# Patient Record
Sex: Male | Born: 1950 | Race: White | Hispanic: No | Marital: Married | Smoking: Current every day smoker
Health system: Southern US, Community
[De-identification: ages and names within clinical notes are randomized; demographics above are authoritative.]

## PROBLEM LIST (undated history)

## (undated) DIAGNOSIS — Z87442 Personal history of urinary calculi: Secondary | ICD-10-CM

## (undated) DIAGNOSIS — Z860100 Personal history of colon polyps, unspecified: Secondary | ICD-10-CM

## (undated) DIAGNOSIS — C801 Malignant (primary) neoplasm, unspecified: Secondary | ICD-10-CM

## (undated) DIAGNOSIS — I10 Essential (primary) hypertension: Secondary | ICD-10-CM

## (undated) DIAGNOSIS — E7800 Pure hypercholesterolemia, unspecified: Secondary | ICD-10-CM

## (undated) DIAGNOSIS — R519 Headache, unspecified: Secondary | ICD-10-CM

## (undated) DIAGNOSIS — T7840XA Allergy, unspecified, initial encounter: Secondary | ICD-10-CM

## (undated) DIAGNOSIS — R51 Headache: Secondary | ICD-10-CM

## (undated) HISTORY — DX: Personal history of colonic polyps: Z86.010

## (undated) HISTORY — DX: Personal history of colon polyps, unspecified: Z86.0100

## (undated) HISTORY — DX: Essential (primary) hypertension: I10

## (undated) HISTORY — DX: Allergy, unspecified, initial encounter: T78.40XA

## (undated) HISTORY — PX: COLONOSCOPY: SHX174

---

## 2000-11-08 ENCOUNTER — Encounter (INDEPENDENT_AMBULATORY_CARE_PROVIDER_SITE_OTHER): Payer: Self-pay | Admitting: Specialist

## 2000-11-08 ENCOUNTER — Ambulatory Visit (HOSPITAL_COMMUNITY): Admission: RE | Admit: 2000-11-08 | Discharge: 2000-11-08 | Payer: Self-pay | Admitting: Gastroenterology

## 2005-03-30 ENCOUNTER — Ambulatory Visit (HOSPITAL_COMMUNITY): Admission: RE | Admit: 2005-03-30 | Discharge: 2005-03-30 | Payer: Self-pay | Admitting: Gastroenterology

## 2005-03-30 ENCOUNTER — Encounter (INDEPENDENT_AMBULATORY_CARE_PROVIDER_SITE_OTHER): Payer: Self-pay | Admitting: Specialist

## 2006-05-02 ENCOUNTER — Ambulatory Visit: Payer: Self-pay | Admitting: Family Medicine

## 2006-05-08 ENCOUNTER — Ambulatory Visit: Payer: Self-pay | Admitting: Family Medicine

## 2006-08-09 ENCOUNTER — Ambulatory Visit: Payer: Self-pay | Admitting: Family Medicine

## 2006-08-15 ENCOUNTER — Ambulatory Visit: Payer: Self-pay | Admitting: Family Medicine

## 2006-11-08 ENCOUNTER — Ambulatory Visit: Payer: Self-pay | Admitting: Family Medicine

## 2006-11-20 ENCOUNTER — Ambulatory Visit: Payer: Self-pay | Admitting: Family Medicine

## 2007-03-29 ENCOUNTER — Ambulatory Visit: Payer: Self-pay | Admitting: Family Medicine

## 2007-07-15 ENCOUNTER — Ambulatory Visit: Payer: Self-pay | Admitting: Family Medicine

## 2007-10-10 ENCOUNTER — Ambulatory Visit: Payer: Self-pay | Admitting: Family Medicine

## 2007-11-22 ENCOUNTER — Ambulatory Visit: Payer: Self-pay | Admitting: Family Medicine

## 2008-03-02 ENCOUNTER — Ambulatory Visit: Payer: Self-pay | Admitting: Family Medicine

## 2008-11-05 ENCOUNTER — Ambulatory Visit: Payer: Self-pay | Admitting: Family Medicine

## 2009-01-08 ENCOUNTER — Ambulatory Visit: Payer: Self-pay | Admitting: Family Medicine

## 2009-08-06 ENCOUNTER — Ambulatory Visit: Payer: Self-pay | Admitting: Family Medicine

## 2009-12-10 ENCOUNTER — Ambulatory Visit: Payer: Self-pay | Admitting: Family Medicine

## 2010-04-12 ENCOUNTER — Ambulatory Visit: Payer: Self-pay | Admitting: Family Medicine

## 2010-08-11 ENCOUNTER — Ambulatory Visit
Admission: RE | Admit: 2010-08-11 | Discharge: 2010-08-11 | Payer: Self-pay | Source: Home / Self Care | Attending: Family Medicine | Admitting: Family Medicine

## 2010-08-19 ENCOUNTER — Ambulatory Visit
Admission: RE | Admit: 2010-08-19 | Discharge: 2010-08-19 | Payer: Self-pay | Source: Home / Self Care | Attending: Family Medicine | Admitting: Family Medicine

## 2010-09-27 ENCOUNTER — Ambulatory Visit (INDEPENDENT_AMBULATORY_CARE_PROVIDER_SITE_OTHER): Payer: BC Managed Care – PPO | Admitting: Family Medicine

## 2010-09-27 ENCOUNTER — Other Ambulatory Visit: Payer: Self-pay | Admitting: Family Medicine

## 2010-09-27 DIAGNOSIS — R319 Hematuria, unspecified: Secondary | ICD-10-CM

## 2010-09-28 ENCOUNTER — Other Ambulatory Visit: Payer: BC Managed Care – PPO

## 2010-10-26 ENCOUNTER — Encounter: Payer: Self-pay | Admitting: Family Medicine

## 2010-11-04 LAB — HM COLONOSCOPY

## 2010-11-28 ENCOUNTER — Ambulatory Visit: Payer: BC Managed Care – PPO | Admitting: Family Medicine

## 2010-12-02 ENCOUNTER — Ambulatory Visit (INDEPENDENT_AMBULATORY_CARE_PROVIDER_SITE_OTHER): Payer: BC Managed Care – PPO | Admitting: Family Medicine

## 2010-12-02 ENCOUNTER — Encounter: Payer: Self-pay | Admitting: Family Medicine

## 2010-12-02 DIAGNOSIS — K5730 Diverticulosis of large intestine without perforation or abscess without bleeding: Secondary | ICD-10-CM

## 2010-12-02 DIAGNOSIS — E1169 Type 2 diabetes mellitus with other specified complication: Secondary | ICD-10-CM

## 2010-12-02 DIAGNOSIS — N400 Enlarged prostate without lower urinary tract symptoms: Secondary | ICD-10-CM

## 2010-12-02 DIAGNOSIS — I10 Essential (primary) hypertension: Secondary | ICD-10-CM

## 2010-12-02 DIAGNOSIS — J309 Allergic rhinitis, unspecified: Secondary | ICD-10-CM

## 2010-12-02 DIAGNOSIS — Z860100 Personal history of colon polyps, unspecified: Secondary | ICD-10-CM

## 2010-12-02 DIAGNOSIS — E119 Type 2 diabetes mellitus without complications: Secondary | ICD-10-CM

## 2010-12-02 DIAGNOSIS — E785 Hyperlipidemia, unspecified: Secondary | ICD-10-CM

## 2010-12-02 DIAGNOSIS — E1159 Type 2 diabetes mellitus with other circulatory complications: Secondary | ICD-10-CM | POA: Insufficient documentation

## 2010-12-02 DIAGNOSIS — E118 Type 2 diabetes mellitus with unspecified complications: Secondary | ICD-10-CM | POA: Insufficient documentation

## 2010-12-02 MED ORDER — VARENICLINE TARTRATE 0.5 MG PO TABS: 0.5000 mg | ORAL_TABLET | Freq: Two times a day (BID) | ORAL | Status: DC

## 2010-12-02 NOTE — Progress Notes (Signed)
  Subjective:    Patient ID: Justin [name], male    DOB: 1950/11/14, 60 y.o.   MRN: [id number]  HPI he is here for a followup visit. He states his blood sugars are running between 90 and 120. He has an eye exam due in the near future. He does check his feet regularly. He is interested in quitting smoking. He was recently evaluated by urology and found to have BPH as well as a renal stone. He is on finasteride. He is also had a recent colonoscopy which did show evidence of diverticulosis as well as colonic polyps. His brother in law recently died.    Review of Systems Negative except as above    Objective:   Physical Exam dirt and in no distress otherwise not examined        Assessment & Plan:  Diabetes. Dyslipidemia. Hypertension. Cigarette abuse. BPH. Colonic polyps. Diverticulosis. Over 45 minutes spent discussing his present situation. He is to continue on his present medication regimen. I discussed smoking cessation with him in regard to Abbot versus addiction. He does have a good handle on this. I will place him on Chantix. Discussed possible side effects of this medication. He will set up an appointment to see me in one month after he starts the Chantix.

## 2010-12-02 NOTE — Patient Instructions (Signed)
Stay on your present medications. Set up an appointment to see me one month after you start the Chantix area and if you have problems in between. Don't hesitate to call.

## 2010-12-09 NOTE — Procedures (Signed)
[place]  Patient:    Justin [name], Justin [name]                     MRN: [id number] Adm. Date:  [id number] Attending:  Charna Elizabeth CC:         Ronnald Nian, M.D.   Procedure Report  DATE OF BIRTH:  19-Jul-1951.  PROCEDURE:  Colonoscopy with snare polypectomy x 4.  ENDOSCOPIST:  Anselmo Rod, M.D.  INSTRUMENT USED:  Olympus video colonoscope.  INDICATION FOR PROCEDURE:  History of rectal bleeding that has been ongoing for two years in a 60 year old white male who had a recent flexible sigmoidoscopy by Dr. Sharlot Gowda that showed a polyp in the left colon. Colonoscopy is being planned to rule out other colonic polyps.  Polypectomy will be done.  PREPROCEDURE PREPARATION:  Informed consent was procured from the patient. The patient was fasted for eight hours prior to the procedure and prepped with a bottle of magnesium citrate and a gallon of NuLytely the night prior to the procedure.  PREPROCEDURE PHYSICAL:  VITAL SIGNS:  The patient had stable vital signs.  NECK:  Supple.  CHEST:  Clear to auscultation.  S1, S2 regular.  ABDOMEN:  Soft with normal abdominal bowel sounds.  DESCRIPTION OF PROCEDURE:  The patient was placed in the left lateral decubitus position and sedated with 50 mg of Demerol and 5 mg of Versed intravenously.  Once the patient was adequately sedate and maintained on low-flow oxygen and continuous cardiac monitoring, the Olympus video colonoscope was advanced from the rectum to the cecum without difficulty.  The patient had a fairly good prep.  There was some residual stool in the colon, especially in the right and transverse colon.  A small polyp was snared at 90 cm.  This was small and sessile in nature.  Another small polyp was snared from 110 cm.  A large pedunculated polyp was snared at 28 cm after the base of the polyp was injected with 5 cc of epinephrine.  There was no bleeding from the polypectomy  site.  Another polyp was snared from about 20 cm.  It was sessile in nature and measured about 5-6 mm in size.  The rest of the colonic mucosa appeared healthy and without lesions.  IMPRESSION:  Multiple colonic polyps.  RECOMMENDATIONS: 1. Await pathology results. 2. Avoid all nonsteroidals, including aspirin, for now. 3. Outpatient follow-up in the next week for further recommendations depending    upon the biopsy results. DD:  11/08/00 TD:  11/10/00 Job: [id number]

## 2011-01-05 ENCOUNTER — Ambulatory Visit: Payer: BC Managed Care – PPO | Admitting: Family Medicine

## 2011-01-06 ENCOUNTER — Ambulatory Visit: Payer: BC Managed Care – PPO | Admitting: Family Medicine

## 2011-04-06 ENCOUNTER — Ambulatory Visit: Payer: BC Managed Care – PPO | Admitting: Family Medicine

## 2011-04-07 ENCOUNTER — Encounter: Payer: Self-pay | Admitting: Family Medicine

## 2011-04-07 ENCOUNTER — Ambulatory Visit (INDEPENDENT_AMBULATORY_CARE_PROVIDER_SITE_OTHER): Payer: BC Managed Care – PPO | Admitting: Family Medicine

## 2011-04-07 DIAGNOSIS — E119 Type 2 diabetes mellitus without complications: Secondary | ICD-10-CM

## 2011-04-07 DIAGNOSIS — E785 Hyperlipidemia, unspecified: Secondary | ICD-10-CM

## 2011-04-07 DIAGNOSIS — Z23 Encounter for immunization: Secondary | ICD-10-CM

## 2011-04-07 DIAGNOSIS — F17200 Nicotine dependence, unspecified, uncomplicated: Secondary | ICD-10-CM

## 2011-04-07 DIAGNOSIS — E1159 Type 2 diabetes mellitus with other circulatory complications: Secondary | ICD-10-CM

## 2011-04-07 DIAGNOSIS — Z2911 Encounter for prophylactic immunotherapy for respiratory syncytial virus (RSV): Secondary | ICD-10-CM

## 2011-04-07 DIAGNOSIS — E1169 Type 2 diabetes mellitus with other specified complication: Secondary | ICD-10-CM

## 2011-04-07 DIAGNOSIS — I10 Essential (primary) hypertension: Secondary | ICD-10-CM

## 2011-04-07 MED ORDER — PIOGLITAZONE HCL 45 MG PO TABS: 45.0000 mg | ORAL_TABLET | Freq: Every day | ORAL | Status: DC

## 2011-04-07 NOTE — Patient Instructions (Signed)
Continue on your present medications and continue to work on quitting smoking.

## 2011-04-07 NOTE — Progress Notes (Signed)
Pt referred to Dr. Dione Booze [phone]  Faxed ov.  Pt aware of appt on 04-20-11 at 3:15

## 2011-04-07 NOTE — Progress Notes (Signed)
  Subjective:    Patient ID: Justin [name], male    DOB: 1951/05/10, 60 y.o.   MRN: [id number]  HPI He is here for a diabetes check. He does smoke. He tried Chantix but just for a few days and stopped. He has cut back to three quarters of a pack per day. Checks his blood sugars periodically and they run in the low 100s. He does need an I. exam. He does check his feet regularly. He continues to work and does plan to retire within the next several years. His marriage is quite well. He continues on other medications listed in the chart.   Review of Systems Negative except as above    Objective:   Physical Exam  Alert and in no distress otherwise not examined      Assessment & Plan:   1. Diabetes mellitus  Ambulatory referral to Ophthalmology, POCT HgB A1C, HgB A1c  2. Current smoker    3. Hypertension associated with diabetes    4. Hyperlipidemia LDL goal < 70     Continue to work on smoking cessation. Continue on current medications. Hemoglobin A1c will be ordered. Flu shot given.

## 2011-04-08 LAB — HEMOGLOBIN A1C: Hgb A1c MFr Bld: 6.3 % — ABNORMAL HIGH (ref <5.7)

## 2011-04-10 ENCOUNTER — Telehealth: Payer: Self-pay

## 2011-04-10 NOTE — Telephone Encounter (Signed)
Talked with Justin [name] told her a1c 6.3

## 2011-08-04 ENCOUNTER — Encounter: Payer: Self-pay | Admitting: Family Medicine

## 2011-08-04 ENCOUNTER — Ambulatory Visit (INDEPENDENT_AMBULATORY_CARE_PROVIDER_SITE_OTHER): Payer: BC Managed Care – PPO | Admitting: Family Medicine

## 2011-08-04 VITALS — BP 130/90 | HR 102 | Ht 70.0 in | Wt 220.0 lb

## 2011-08-04 DIAGNOSIS — I10 Essential (primary) hypertension: Secondary | ICD-10-CM

## 2011-08-04 DIAGNOSIS — E785 Hyperlipidemia, unspecified: Secondary | ICD-10-CM

## 2011-08-04 DIAGNOSIS — E1169 Type 2 diabetes mellitus with other specified complication: Secondary | ICD-10-CM

## 2011-08-04 DIAGNOSIS — E119 Type 2 diabetes mellitus without complications: Secondary | ICD-10-CM

## 2011-08-04 DIAGNOSIS — N400 Enlarged prostate without lower urinary tract symptoms: Secondary | ICD-10-CM

## 2011-08-04 LAB — POCT GLYCOSYLATED HEMOGLOBIN (HGB A1C): Hemoglobin A1C: 6.5

## 2011-08-04 NOTE — Patient Instructions (Signed)
Reporting on the smoking and keep tapering down on that. See you in 4 months

## 2011-08-04 NOTE — Progress Notes (Signed)
  Subjective:    Patient ID: Justin [name], male    DOB: 10/10/1950, 61 y.o.   MRN: [id number]  HPI He is here for a diabetes recheck. He continues on meds listed in the chart. He is having no difficulty with urinary symptoms. He does check his feet periodically and has had a recent eye exam. He is now smoking less than half a pack per day. Exercise is quite minimal since he is laid off from work. He does occasionally check his blood sugar. His marriage is going well.   Review of Systems     Objective:   Physical Exam Alert and in no distress. Hemoglobin A1c is 6.5.       Assessment & Plan:   1. Type II or unspecified type diabetes mellitus without mention of complication, not stated as uncontrolled  POCT HgB A1C  2. BPH (benign prostatic hyperplasia)    3. Diabetes mellitus    4. Hyperlipidemia LDL goal <70    5. Hypertension associated with diabetes     continue on present medications. Continue to work on quitting smoking. Followup here in 4 months

## 2011-12-05 ENCOUNTER — Encounter: Payer: Self-pay | Admitting: Internal Medicine

## 2011-12-08 ENCOUNTER — Encounter: Payer: Self-pay | Admitting: Family Medicine

## 2011-12-08 ENCOUNTER — Ambulatory Visit (INDEPENDENT_AMBULATORY_CARE_PROVIDER_SITE_OTHER): Payer: BC Managed Care – PPO | Admitting: Family Medicine

## 2011-12-08 DIAGNOSIS — Z79899 Other long term (current) drug therapy: Secondary | ICD-10-CM

## 2011-12-08 DIAGNOSIS — E785 Hyperlipidemia, unspecified: Secondary | ICD-10-CM

## 2011-12-08 DIAGNOSIS — J309 Allergic rhinitis, unspecified: Secondary | ICD-10-CM

## 2011-12-08 DIAGNOSIS — I10 Essential (primary) hypertension: Secondary | ICD-10-CM

## 2011-12-08 DIAGNOSIS — F17200 Nicotine dependence, unspecified, uncomplicated: Secondary | ICD-10-CM

## 2011-12-08 DIAGNOSIS — E1169 Type 2 diabetes mellitus with other specified complication: Secondary | ICD-10-CM

## 2011-12-08 DIAGNOSIS — E119 Type 2 diabetes mellitus without complications: Secondary | ICD-10-CM

## 2011-12-08 DIAGNOSIS — N400 Enlarged prostate without lower urinary tract symptoms: Secondary | ICD-10-CM

## 2011-12-08 LAB — COMPREHENSIVE METABOLIC PANEL
ALT: 16 U/L (ref 0–53)
AST: 14 U/L (ref 0–37)
Albumin: 4.5 g/dL (ref 3.5–5.2)
Alkaline Phosphatase: 78 U/L (ref 39–117)
Potassium: 3.6 mEq/L (ref 3.5–5.3)
Sodium: 135 mEq/L (ref 135–145)
Total Protein: 7.3 g/dL (ref 6.0–8.3)

## 2011-12-08 LAB — CBC WITH DIFFERENTIAL/PLATELET
Basophils Absolute: 0.1 10*3/uL (ref 0.0–0.1)
Basophils Relative: 0 % (ref 0–1)
Hemoglobin: 16.3 g/dL (ref 13.0–17.0)
MCHC: 34.4 g/dL (ref 30.0–36.0)
Monocytes Relative: 8 % (ref 3–12)
Neutro Abs: 8.7 10*3/uL — ABNORMAL HIGH (ref 1.7–7.7)
Neutrophils Relative %: 61 % (ref 43–77)
RDW: 13.5 % (ref 11.5–15.5)

## 2011-12-08 LAB — LIPID PANEL: LDL Cholesterol: 77 mg/dL (ref 0–99)

## 2011-12-08 MED ORDER — SIMVASTATIN 10 MG PO TABS: 10.0000 mg | ORAL_TABLET | Freq: Every day | ORAL | Status: DC

## 2011-12-08 MED ORDER — LISINOPRIL-HYDROCHLOROTHIAZIDE 10-12.5 MG PO TABS: 1.0000 | ORAL_TABLET | Freq: Every day | ORAL | Status: DC

## 2011-12-08 MED ORDER — PIOGLITAZONE HCL 45 MG PO TABS
45.0000 mg | ORAL_TABLET | Freq: Every day | ORAL | Status: DC
Start: 2011-12-08 — End: 2012-08-16

## 2011-12-08 MED ORDER — FINASTERIDE 5 MG PO TABS: 5.0000 mg | ORAL_TABLET | Freq: Every day | ORAL | Status: DC

## 2011-12-08 NOTE — Progress Notes (Signed)
  Subjective:    Patient ID: Justin [name], male    DOB: 11-06-1950, 61 y.o.   MRN: [id number]  HPI He is here for a diabetes recheck. He continues on medications listed in the chart. He continues to smoke and again is not interested in quitting. He does intermittently check his blood sugars as well as his feet. He has had an eye exam in the last year. He's has seen an improvement in his urinating since being placed on a finasteride. His allergies are under good control. He view of his record indicates he needs routine blood screening.  Review of Systems     Objective:   Physical Exam Alert and in no distress. Hemoglobin A1c is 6.4.       Assessment & Plan:   1. Diabetes mellitus  POCT HgB A1C, pioglitazone (ACTOS) 45 MG tablet, CBC with Differential, Comprehensive metabolic panel, Lipid panel, POCT UA - Microalbumin  2. Hypertension associated with diabetes  lisinopril-hydrochlorothiazide (PRINZIDE,ZESTORETIC) 10-12.5 MG per tablet, CBC with Differential, Comprehensive metabolic panel  3. Hyperlipidemia LDL goal <70  simvastatin (ZOCOR) 10 MG tablet, Lipid panel  4. Allergic rhinitis    5. Current every day smoker    6. BPH (benign prostatic hyperplasia)  finasteride (PROSCAR) 5 MG tablet  7. Encounter for long-term (current) use of other medications  CBC with Differential, Comprehensive metabolic panel, Lipid panel   he will continue on present medications. Followup pending blood results. His medications were all renewed.

## 2011-12-25 ENCOUNTER — Other Ambulatory Visit: Payer: Self-pay

## 2011-12-25 MED ORDER — FREESTYLE LANCETS MISC: Status: DC

## 2011-12-25 MED ORDER — GLUCOSE BLOOD VI STRP: ORAL_STRIP | Status: DC

## 2012-04-11 ENCOUNTER — Ambulatory Visit (INDEPENDENT_AMBULATORY_CARE_PROVIDER_SITE_OTHER): Payer: BC Managed Care – PPO | Admitting: Family Medicine

## 2012-04-11 ENCOUNTER — Encounter: Payer: Self-pay | Admitting: Family Medicine

## 2012-04-11 VITALS — BP 110/70 | HR 78 | Wt 219.0 lb

## 2012-04-11 DIAGNOSIS — E785 Hyperlipidemia, unspecified: Secondary | ICD-10-CM

## 2012-04-11 DIAGNOSIS — E1169 Type 2 diabetes mellitus with other specified complication: Secondary | ICD-10-CM

## 2012-04-11 DIAGNOSIS — F17200 Nicotine dependence, unspecified, uncomplicated: Secondary | ICD-10-CM

## 2012-04-11 DIAGNOSIS — Z23 Encounter for immunization: Secondary | ICD-10-CM

## 2012-04-11 DIAGNOSIS — I10 Essential (primary) hypertension: Secondary | ICD-10-CM

## 2012-04-11 DIAGNOSIS — E1159 Type 2 diabetes mellitus with other circulatory complications: Secondary | ICD-10-CM

## 2012-04-11 DIAGNOSIS — E119 Type 2 diabetes mellitus without complications: Secondary | ICD-10-CM

## 2012-04-11 LAB — POCT GLYCOSYLATED HEMOGLOBIN (HGB A1C): Hemoglobin A1C: 6.6

## 2012-04-11 NOTE — Progress Notes (Signed)
  Subjective:    Patient ID: Justin [name], male    DOB: [birth year], 61 y.o.   MRN: [id number]  HPI He is here for a followup visit concerning his diabetes. He does check his blood sugars twice per day. He is physically active. He continues to smoke and is not interested in quitting. He continues on medications listed in the chart. He checks his feet regularly and has had an eye exam. His marriage is going well. He is semiretired.  Review of Systems     Objective:   Physical Exam Alert and in no distress. Hemoglobin A1c is 6.6.       Assessment & Plan:   1. Diabetes mellitus  POCT glycosylated hemoglobin (Hb A1C)  2. Need for prophylactic vaccination and inoculation against influenza  Flu vaccine greater than or equal to 3yo preservative free IM  3. Hyperlipidemia LDL goal <70    4. Hypertension associated with diabetes    5. Current every day smoker

## 2012-04-11 NOTE — Patient Instructions (Addendum)
Check your blood sugars either before a meal or 2 hours after a meal 

## 2012-08-16 ENCOUNTER — Other Ambulatory Visit: Payer: Self-pay

## 2012-08-16 ENCOUNTER — Ambulatory Visit (INDEPENDENT_AMBULATORY_CARE_PROVIDER_SITE_OTHER): Payer: BC Managed Care – PPO | Admitting: Family Medicine

## 2012-08-16 ENCOUNTER — Encounter: Payer: Self-pay | Admitting: Family Medicine

## 2012-08-16 VITALS — BP 120/70 | HR 88 | Wt 216.0 lb

## 2012-08-16 DIAGNOSIS — E1169 Type 2 diabetes mellitus with other specified complication: Secondary | ICD-10-CM

## 2012-08-16 DIAGNOSIS — I10 Essential (primary) hypertension: Secondary | ICD-10-CM

## 2012-08-16 DIAGNOSIS — E1159 Type 2 diabetes mellitus with other circulatory complications: Secondary | ICD-10-CM

## 2012-08-16 DIAGNOSIS — E785 Hyperlipidemia, unspecified: Secondary | ICD-10-CM

## 2012-08-16 DIAGNOSIS — F17200 Nicotine dependence, unspecified, uncomplicated: Secondary | ICD-10-CM

## 2012-08-16 DIAGNOSIS — E119 Type 2 diabetes mellitus without complications: Secondary | ICD-10-CM

## 2012-08-16 LAB — POCT GLYCOSYLATED HEMOGLOBIN (HGB A1C): Hemoglobin A1C: 6.5

## 2012-08-16 MED ORDER — PIOGLITAZONE HCL 45 MG PO TABS: 45.0000 mg | ORAL_TABLET | Freq: Every day | ORAL | Status: DC

## 2012-08-16 NOTE — Progress Notes (Signed)
  Subjective:    Patient ID: Justin [name], male    DOB: 01/14/1951, 62 y.o.   MRN: [id number]  HPI He is here for a diabetes recheck. He continues on medications listed in the chart. He is on a limited work schedule. He checks his blood sugars once or twice per week and they are usually in the low 100 range. His exercise is minimal. He continues to smoke and is not interested in quitting. He has not set up for an eye exam yet but does plan to. He does check his feet periodically. He does occasionally have aches and pains in his hips and knees and takes Advil every 2 weeks.   Review of Systems     Objective:   Physical Exam Alert and in no distress. Hemoglobin A1c is 6.5.       Assessment & Plan:   1. Diabetes mellitus  POCT glycosylated hemoglobin (Hb A1C)  2. Hypertension associated with diabetes    3. Hyperlipidemia LDL goal <70    4. Current every day smoker     continue present medication regimen. Did discuss increasing his physical activity. Discussed the possibility of him retiring however he is definitely not interested in quitting work. He enjoys his work. He will set up an appointment to see his eye doctor.

## 2012-12-20 ENCOUNTER — Ambulatory Visit (INDEPENDENT_AMBULATORY_CARE_PROVIDER_SITE_OTHER): Payer: BC Managed Care – PPO | Admitting: Family Medicine

## 2012-12-20 ENCOUNTER — Encounter: Payer: Self-pay | Admitting: Family Medicine

## 2012-12-20 VITALS — BP 120/80 | HR 82 | Wt 214.0 lb

## 2012-12-20 DIAGNOSIS — E785 Hyperlipidemia, unspecified: Secondary | ICD-10-CM

## 2012-12-20 DIAGNOSIS — M1990 Unspecified osteoarthritis, unspecified site: Secondary | ICD-10-CM | POA: Insufficient documentation

## 2012-12-20 DIAGNOSIS — E119 Type 2 diabetes mellitus without complications: Secondary | ICD-10-CM

## 2012-12-20 DIAGNOSIS — F17200 Nicotine dependence, unspecified, uncomplicated: Secondary | ICD-10-CM

## 2012-12-20 DIAGNOSIS — Z79899 Other long term (current) drug therapy: Secondary | ICD-10-CM

## 2012-12-20 DIAGNOSIS — E1169 Type 2 diabetes mellitus with other specified complication: Secondary | ICD-10-CM

## 2012-12-20 DIAGNOSIS — E1159 Type 2 diabetes mellitus with other circulatory complications: Secondary | ICD-10-CM

## 2012-12-20 DIAGNOSIS — I10 Essential (primary) hypertension: Secondary | ICD-10-CM

## 2012-12-20 DIAGNOSIS — M129 Arthropathy, unspecified: Secondary | ICD-10-CM

## 2012-12-20 LAB — CBC WITH DIFFERENTIAL/PLATELET
Lymphocytes Relative: 20 % (ref 12–46)
Lymphs Abs: 3.7 10*3/uL (ref 0.7–4.0)
MCV: 88.3 fL (ref 78.0–100.0)
Neutrophils Relative %: 71 % (ref 43–77)
Platelets: 304 10*3/uL (ref 150–400)
RBC: 5.29 MIL/uL (ref 4.22–5.81)
WBC: 18.4 10*3/uL — ABNORMAL HIGH (ref 4.0–10.5)

## 2012-12-20 LAB — POCT UA - MICROALBUMIN
Albumin/Creatinine Ratio, Urine, POC: 13
Creatinine, POC: 96.7 mg/dL

## 2012-12-20 MED ORDER — PIOGLITAZONE HCL 45 MG PO TABS: 45.0000 mg | ORAL_TABLET | Freq: Every day | ORAL | Status: DC

## 2012-12-20 NOTE — Patient Instructions (Signed)
Try 2 Tylenol 4 times per day and then you can take Advil if you need

## 2012-12-20 NOTE — Progress Notes (Signed)
Subjective:    Justin [name] is a 62 y.o. male who presents for follow-up of Type 2 diabetes mellitus.    Home blood sugar records: 90 TO 120 TEST 1 TIME A DAY   Current symptoms/problems NONE Daily foot checks, foot concerns: YES Last eye exam:  2013   Medication compliance:good Current diet: NONE Current exercise: NONE Known diabetic complications: none Cardiovascular risk factors: advanced age (older than 68 for men, 18 for women), diabetes mellitus, dyslipidemia, hypertension, male gender and smoking/ tobacco exposure He does complain of knee pain especially with physical activity. He notes especially going up and down steps and getting in and out of vehicles cause trouble.  The following portions of the patient's history were reviewed and updated as appropriate: allergies, current medications, past family history, past medical history, past social history, past surgical history and problem list. He is going to set up to get an eye exam. ROS as in subjective above    Objective:    Wt 214 lb (97.07 kg)  BMI 30.71 kg/m2  There were no vitals filed for this visit.  General appearence: alert, no distress, WD/WN Ext: no edema Foot exam: wnl,with a small area of abrasion noted on the right medial foot Neuro: foot monofilament exam normal Exam of his knee shows no deformity or swelling or tenderness.  Lab Review Lab Results  Component Value Date   HGBA1C 6.5 08/16/2012   Lab Results  Component Value Date   CHOL 131 12/08/2011   HDL 27* 12/08/2011   LDLCALC 77 12/08/2011   TRIG 137 12/08/2011   CHOLHDL 4.9 12/08/2011   No results found for this basename: Concepcion Elk     Chemistry      Component Value Date/Time   NA 135 12/08/2011 1538   K 3.6 12/08/2011 1538   CL 102 12/08/2011 1538   CO2 22 12/08/2011 1538   BUN 12 12/08/2011 1538   CREATININE 0.87 12/08/2011 1538      Component Value Date/Time   CALCIUM 9.6 12/08/2011 1538   ALKPHOS 78 12/08/2011 1538   AST  14 12/08/2011 1538   ALT 16 12/08/2011 1538   BILITOT 0.5 12/08/2011 1538        Chemistry      Component Value Date/Time   NA 135 12/08/2011 1538   K 3.6 12/08/2011 1538   CL 102 12/08/2011 1538   CO2 22 12/08/2011 1538   BUN 12 12/08/2011 1538   CREATININE 0.87 12/08/2011 1538      Component Value Date/Time   CALCIUM 9.6 12/08/2011 1538   ALKPHOS 78 12/08/2011 1538   AST 14 12/08/2011 1538   ALT 16 12/08/2011 1538   BILITOT 0.5 12/08/2011 1538       Last optometry/ophthalmology exam reviewed from:    Assessment:  Diabetes mellitus - Plan: POCT glycosylated hemoglobin (Hb A1C), pioglitazone (ACTOS) 45 MG tablet, POCT UA - Microalbumin  Hypertension associated with diabetes  Hyperlipidemia LDL goal <70 - Plan: Lipid panel  Arthritis  Current smoker  Encounter for long-term (current) use of other medications - Plan: CBC with Differential, Comprehensive metabolic panel, Lipid panel        Plan:   recommending conservative care for his arthritis with the use of Tylenol and then inset of choice. 1.  Rx changes: none 2.  Education: Reviewed 'ABCs' of diabetes management (respective goals in parentheses):  A1C (<7), blood pressure (<130/80), and cholesterol (LDL <100). 3.  Compliance at present is estimated to be fair. Efforts  to improve compliance (if necessary) will be directed at none. 4. Follow up: 4 months

## 2012-12-21 LAB — COMPREHENSIVE METABOLIC PANEL
ALT: 14 U/L (ref 0–53)
CO2: 21 mEq/L (ref 19–32)
Calcium: 9.4 mg/dL (ref 8.4–10.5)
Chloride: 106 mEq/L (ref 96–112)
Sodium: 136 mEq/L (ref 135–145)
Total Protein: 7 g/dL (ref 6.0–8.3)

## 2012-12-21 LAB — LIPID PANEL: Cholesterol: 98 mg/dL (ref 0–200)

## 2012-12-23 ENCOUNTER — Other Ambulatory Visit: Payer: Self-pay

## 2012-12-24 ENCOUNTER — Encounter: Payer: Self-pay | Admitting: Family Medicine

## 2012-12-24 ENCOUNTER — Other Ambulatory Visit: Payer: Self-pay | Admitting: Family Medicine

## 2012-12-24 ENCOUNTER — Ambulatory Visit (INDEPENDENT_AMBULATORY_CARE_PROVIDER_SITE_OTHER): Payer: BC Managed Care – PPO | Admitting: Family Medicine

## 2012-12-24 VITALS — BP 160/100 | HR 103

## 2012-12-24 DIAGNOSIS — R310 Gross hematuria: Secondary | ICD-10-CM

## 2012-12-24 DIAGNOSIS — D72829 Elevated white blood cell count, unspecified: Secondary | ICD-10-CM

## 2012-12-24 NOTE — Progress Notes (Signed)
  Subjective:    Patient ID: Justin [name], male    DOB: 1951-01-22, 62 y.o.   MRN: [id number]  HPI He was doing fine until last night when he urinated he noted gross blood. He had no more difficulty until he went to bed that night and developed lower abdominal discomfort as well as continued hematuria,frequency, feeling of incomplete emptying as well as decreased stream. He also saw clots in his urine. He has a previous history of urinary difficulty and has seen Dr. Patsi Sears in the past for this. His medications were reviewed. Also of note is an abnormal white blood count.  Review of Systems     Objective:   Physical Exam Alert and in no distress. Abdominal exam shows tenderness in the suprapubic area but no palpable mass. Urine is grossly bloody.       Assessment & Plan:   Hematuria, gross  Elevated white blood cell count  he will be referred to urology today. I will odor flow cytometry

## 2012-12-25 LAB — REFLEX MYELOID PANEL

## 2012-12-25 LAB — IMMUNOPHENOTYPING BY FLOW CYTOMETRY

## 2013-02-12 NOTE — Progress Notes (Signed)
Quick Note:  Pt wife informed word for word ______

## 2013-04-22 ENCOUNTER — Ambulatory Visit: Payer: BC Managed Care – PPO | Admitting: Family Medicine

## 2013-04-25 ENCOUNTER — Ambulatory Visit (INDEPENDENT_AMBULATORY_CARE_PROVIDER_SITE_OTHER): Payer: BC Managed Care – PPO | Admitting: Family Medicine

## 2013-04-25 ENCOUNTER — Encounter: Payer: Self-pay | Admitting: Family Medicine

## 2013-04-25 VITALS — BP 114/70 | HR 89 | Wt 211.0 lb

## 2013-04-25 DIAGNOSIS — J069 Acute upper respiratory infection, unspecified: Secondary | ICD-10-CM

## 2013-04-25 DIAGNOSIS — Z23 Encounter for immunization: Secondary | ICD-10-CM

## 2013-04-25 DIAGNOSIS — I10 Essential (primary) hypertension: Secondary | ICD-10-CM

## 2013-04-25 DIAGNOSIS — E1169 Type 2 diabetes mellitus with other specified complication: Secondary | ICD-10-CM

## 2013-04-25 DIAGNOSIS — F17200 Nicotine dependence, unspecified, uncomplicated: Secondary | ICD-10-CM

## 2013-04-25 DIAGNOSIS — E785 Hyperlipidemia, unspecified: Secondary | ICD-10-CM

## 2013-04-25 DIAGNOSIS — E119 Type 2 diabetes mellitus without complications: Secondary | ICD-10-CM

## 2013-04-25 DIAGNOSIS — E1159 Type 2 diabetes mellitus with other circulatory complications: Secondary | ICD-10-CM

## 2013-04-25 NOTE — Progress Notes (Signed)
  Subjective:    Justin [name] is a 62 y.o. male who presents for follow-up of Type 2 diabetes mellitus.    Home blood sugar records: 120  Current symptoms/problems include NONE Daily foot checks:    Any foot concerns: NO Last eye exam:  2013   Medication compliance: Current diet: NONE Current exercise: WORKING AND WALKING Known diabetic complications: none Cardiovascular risk factors: advanced age (older than 64 for men, 66 for women), diabetes mellitus, dyslipidemia, hypertension, male gender and sedentary lifestyle He has a ten-day history this started with slight cough and runny nose. No fever, chills, sore throat. He is feeling slightly better.  The following portions of the patient's history were reviewed and updated as appropriate: allergies, current medications, past family history, past medical history, past social history and problem list.  ROS as in subjective above    Objective:   General appearence: alert, no distress, WD/WN Neck: supple, no lymphadenopathy, no thyromegaly, no masses Heart: RRR, normal S1, S2, no murmurs Lungs: CTA bilaterally, no wheezes, rhonchi, or rales  Lab Review Lab Results  Component Value Date   HGBA1C 6.0 12/20/2012   Lab Results  Component Value Date   CHOL 98 12/20/2012   HDL 28* 12/20/2012   LDLCALC 55 12/20/2012   TRIG 77 12/20/2012   CHOLHDL 3.5 12/20/2012   No results found for this basenameConcepcion Elk     Chemistry      Component Value Date/Time   NA 136 12/20/2012 1551   K 3.8 12/20/2012 1551   CL 106 12/20/2012 1551   CO2 21 12/20/2012 1551   BUN 7 12/20/2012 1551   CREATININE 0.80 12/20/2012 1551      Component Value Date/Time   CALCIUM 9.4 12/20/2012 1551   ALKPHOS 94 12/20/2012 1551   AST 15 12/20/2012 1551   ALT 14 12/20/2012 1551   BILITOT 0.5 12/20/2012 1551        Chemistry      Component Value Date/Time   NA 136 12/20/2012 1551   K 3.8 12/20/2012 1551   CL 106 12/20/2012 1551   CO2 21 12/20/2012 1551    BUN 7 12/20/2012 1551   CREATININE 0.80 12/20/2012 1551      Component Value Date/Time   CALCIUM 9.4 12/20/2012 1551   ALKPHOS 94 12/20/2012 1551   AST 15 12/20/2012 1551   ALT 14 12/20/2012 1551   BILITOT 0.5 12/20/2012 1551    Hemoglobin A1c is 6.3       Assessment:  Diabetes mellitus - Plan: POCT glycosylated hemoglobin (Hb A1C)  Hypertension associated with diabetes  Hyperlipidemia LDL goal <70  Current smoker  URI, acute  Need for prophylactic vaccination and inoculation against influenza - Plan: Flu Vaccine QUAD 36+ mos IM        Plan:    1.  Rx changes: none 2.  Education: Reviewed 'ABCs' of diabetes management (respective goals in parentheses):  A1C (<7), blood pressure (<130/80), and cholesterol (LDL <100). 3.  Compliance at present is estimated to be good. Efforts to improve compliance (if necessary) will be directed at Nothing. 4. Follow up: 4 months  Supportive care for the URI. Again encouraged him to quit smoking.

## 2013-05-12 ENCOUNTER — Other Ambulatory Visit: Payer: Self-pay | Admitting: Family Medicine

## 2013-07-23 ENCOUNTER — Other Ambulatory Visit: Payer: Self-pay | Admitting: Family Medicine

## 2013-09-05 ENCOUNTER — Encounter: Payer: Self-pay | Admitting: Family Medicine

## 2013-09-05 ENCOUNTER — Ambulatory Visit (INDEPENDENT_AMBULATORY_CARE_PROVIDER_SITE_OTHER): Payer: BC Managed Care – PPO | Admitting: Family Medicine

## 2013-09-05 VITALS — BP 124/82 | HR 72 | Wt 210.0 lb

## 2013-09-05 DIAGNOSIS — M67919 Unspecified disorder of synovium and tendon, unspecified shoulder: Secondary | ICD-10-CM

## 2013-09-05 DIAGNOSIS — I10 Essential (primary) hypertension: Secondary | ICD-10-CM

## 2013-09-05 DIAGNOSIS — M719 Bursopathy, unspecified: Secondary | ICD-10-CM

## 2013-09-05 DIAGNOSIS — Z6379 Other stressful life events affecting family and household: Secondary | ICD-10-CM

## 2013-09-05 DIAGNOSIS — E1169 Type 2 diabetes mellitus with other specified complication: Secondary | ICD-10-CM

## 2013-09-05 DIAGNOSIS — E1159 Type 2 diabetes mellitus with other circulatory complications: Secondary | ICD-10-CM

## 2013-09-05 DIAGNOSIS — M7582 Other shoulder lesions, left shoulder: Secondary | ICD-10-CM

## 2013-09-05 DIAGNOSIS — F17200 Nicotine dependence, unspecified, uncomplicated: Secondary | ICD-10-CM

## 2013-09-05 DIAGNOSIS — E119 Type 2 diabetes mellitus without complications: Secondary | ICD-10-CM

## 2013-09-05 DIAGNOSIS — E785 Hyperlipidemia, unspecified: Secondary | ICD-10-CM

## 2013-09-05 LAB — POCT GLYCOSYLATED HEMOGLOBIN (HGB A1C): HEMOGLOBIN A1C: 6.6

## 2013-09-05 NOTE — Progress Notes (Signed)
   Subjective:    Patient ID: Justin [name], male    DOB: 10-May-1951, 63 y.o.   MRN: [id number]  HPI He is here for a medication check. He has a 2 month history of left shoulder pain but no history of recent injury or overuse. He has increased pain with abduction and external rotation. He has taken 2 Advil per day. He does hear a popping sensation with certain shoulder movements. He notes that sometimes he wakes up in the middle night with shoulder pain especially when he lays on that. He checks his blood sugar roughly 3 times per week and they run between 100 and 125. He continues on medications listed in the chart. His physical activity is limited. He continues to smoke and is not interested in quitting. He has not had an eye exam a year and promises that he will set up next week. He does check his feet regularly. Also of note is the fact that his wife has had some recent neurologic issues as well as carotid endarterectomy. He has been helping with her care. He does seem to be handling this fairly well.   Review of Systems     Objective:   Physical Exam Alert and in no distress. Full motion of the shoulder with pain on abduction. No palpable tenderness. No laxity noted. Neer's and Hawkins test cause discomfort. Hemoglobin A1c is 6.6       Assessment & Plan:  Type II or unspecified type diabetes mellitus without mention of complication, not stated as uncontrolled - Plan: HgB A1c  Hypertension associated with diabetes  Hyperlipidemia LDL goal <70  Current smoker  Stress due to illness of family member  Tendinitis of left rotator cuff  again discussed the need to quit smoking and he is at least considering this especially in lieu of what happened to his wife. He seems to be handling the situation well with his wife. Discussed treatment of his tendinitis and we'll start initially with conservative care with NSAID and if no improvement he will return for a steroid injection. Over 30  minutes spent discussing all these issues with him.

## 2013-09-05 NOTE — Patient Instructions (Signed)
Rotator Cuff Tendinitis  Rotator cuff tendinitis is inflammation of the tough, cord-like bands that connect muscle to bone (tendons) in your rotator cuff. Your rotator cuff is the collection of all the muscles and tendons that connect your arm to your shoulder. Your rotator cuff holds the head of your upper arm bone (humerus) in the cup (fossa) of your shoulder blade (scapula). CAUSES Rotator cuff tendinitis is usually caused by overusing the joint involved.  SIGNS AND SYMPTOMS  Deep ache in the shoulder also felt on the outside upper arm over the shoulder muscle.  Point tenderness over the area that is injured.  Pain comes on gradually and becomes worse with lifting the arm to the side (abduction) or turning it inward (internal rotation).  May lead to a chronic tear: When a rotator cuff tendon becomes inflamed, it runs the risk of losing its blood supply, causing some tendon fibers to die. This increases the risk that the tendon can fray and partially or completely tear. DIAGNOSIS Rotator cuff tendinitis is diagnosed by taking a medical history, performing a physical exam, and reviewing results of imaging exams. The medical history is useful to help determine the type of rotator cuff injury. The physical exam will include looking at the injured shoulder, feeling the injured area, and watching you do range-of-motion exercises. X-ray exams are typically done to rule out other causes of shoulder pain, such as fractures. MRI is the imaging exam usually used for significant shoulder injuries. Sometimes a dye study called CT arthrogram is done, but it is not as widely used as MRI. In some institutions, special ultrasound tests may also be used to aid in the diagnosis. TREATMENT  Less Severe Cases  Use of a sling to rest the shoulder for a short period of time. Prolonged use of the sling can cause stiffness, weakness, and loss of motion of the shoulder joint.  Anti-inflammatory medicines, such as  ibuprofen or naproxen sodium, may be prescribed. More Severe Cases  Physical therapy.  Use of steroid injections into the shoulder joint.  Surgery. HOME CARE INSTRUCTIONS   Use a sling or splint until the pain decreases. Prolonged use of the sling can cause stiffness, weakness, and loss of motion of the shoulder joint.  Apply ice to the injured area:  Put ice in a plastic bag.  Place a towel between your skin and the bag.  Leave the ice on for 20 minutes, 2 3 times a day.  Try to avoid use other than gentle range of motion while your shoulder is painful. Use the shoulder and exercise only as directed by your health care provider. Stop exercises or range of motion if pain or discomfort increases, unless directed otherwise by your health care provider.  Only take over-the-counter or prescription medicines for pain, discomfort, or fever as directed by your health care provider.  If you were given a shoulder sling and straps (immobilizer), do not remove it except as directed, or until you see a health care provider for a follow-up exam. If you need to remove it, move your arm as little as possible or as directed.  You may want to sleep on several pillows at night to lessen swelling and pain. SEEK IMMEDIATE MEDICAL CARE IF:   Your shoulder pain increases or new pain develops in your arm, hand, or fingers and is not relieved with medicines.  You have new, unexplained symptoms, especially increased numbness in the hands or loss of strength.  You develop any worsening of the   problems that brought you in for care.  Your arm, hand, or fingers are numb or tingling.  Your arm, hand, or fingers are swollen, painful, or turn white or blue. MAKE SURE YOU:  Understand these instructions.  Will watch your condition.  Will get help right away if you are not doing well or get worse. Document Released: 09/30/2003 Document Revised: 04/30/2013 Document Reviewed: 02/19/2013 [facility] Patient  Information 2014 [place], [place]. Take 4 Advil 3 times per day for the next 10-14 days and if no improvement and set up an appointment.

## 2013-10-05 ENCOUNTER — Other Ambulatory Visit: Payer: Self-pay | Admitting: Family Medicine

## 2013-12-15 ENCOUNTER — Other Ambulatory Visit: Payer: Self-pay | Admitting: Family Medicine

## 2014-01-02 ENCOUNTER — Encounter: Payer: Self-pay | Admitting: Family Medicine

## 2014-01-02 ENCOUNTER — Ambulatory Visit (INDEPENDENT_AMBULATORY_CARE_PROVIDER_SITE_OTHER): Payer: BC Managed Care – PPO | Admitting: Family Medicine

## 2014-01-02 VITALS — BP 132/80 | HR 76 | Wt 216.0 lb

## 2014-01-02 DIAGNOSIS — Z860100 Personal history of colon polyps, unspecified: Secondary | ICD-10-CM

## 2014-01-02 DIAGNOSIS — N400 Enlarged prostate without lower urinary tract symptoms: Secondary | ICD-10-CM

## 2014-01-02 DIAGNOSIS — E1159 Type 2 diabetes mellitus with other circulatory complications: Secondary | ICD-10-CM

## 2014-01-02 DIAGNOSIS — F17200 Nicotine dependence, unspecified, uncomplicated: Secondary | ICD-10-CM

## 2014-01-02 DIAGNOSIS — M719 Bursopathy, unspecified: Secondary | ICD-10-CM

## 2014-01-02 DIAGNOSIS — M67919 Unspecified disorder of synovium and tendon, unspecified shoulder: Secondary | ICD-10-CM

## 2014-01-02 DIAGNOSIS — I10 Essential (primary) hypertension: Secondary | ICD-10-CM

## 2014-01-02 DIAGNOSIS — E785 Hyperlipidemia, unspecified: Secondary | ICD-10-CM

## 2014-01-02 DIAGNOSIS — E119 Type 2 diabetes mellitus without complications: Secondary | ICD-10-CM

## 2014-01-02 DIAGNOSIS — M7590 Shoulder lesion, unspecified, unspecified shoulder: Secondary | ICD-10-CM

## 2014-01-02 DIAGNOSIS — E1169 Type 2 diabetes mellitus with other specified complication: Secondary | ICD-10-CM

## 2014-01-02 DIAGNOSIS — Z23 Encounter for immunization: Secondary | ICD-10-CM

## 2014-01-02 LAB — CBC WITH DIFFERENTIAL/PLATELET
BASOS PCT: 0 % (ref 0–1)
Basophils Absolute: 0 10*3/uL (ref 0.0–0.1)
EOS ABS: 0.6 10*3/uL (ref 0.0–0.7)
Eosinophils Relative: 4 % (ref 0–5)
HCT: 46.5 % (ref 39.0–52.0)
Hemoglobin: 16.1 g/dL (ref 13.0–17.0)
Lymphocytes Relative: 24 % (ref 12–46)
Lymphs Abs: 3.4 10*3/uL (ref 0.7–4.0)
MCH: 30.8 pg (ref 26.0–34.0)
MCHC: 34.6 g/dL (ref 30.0–36.0)
MCV: 88.9 fL (ref 78.0–100.0)
Monocytes Absolute: 0.7 10*3/uL (ref 0.1–1.0)
Monocytes Relative: 5 % (ref 3–12)
NEUTROS PCT: 67 % (ref 43–77)
Neutro Abs: 9.6 10*3/uL — ABNORMAL HIGH (ref 1.7–7.7)
PLATELETS: 289 10*3/uL (ref 150–400)
RBC: 5.23 MIL/uL (ref 4.22–5.81)
RDW: 13.3 % (ref 11.5–15.5)
WBC: 14.3 10*3/uL — ABNORMAL HIGH (ref 4.0–10.5)

## 2014-01-02 LAB — POCT GLYCOSYLATED HEMOGLOBIN (HGB A1C): Hemoglobin A1C: 6

## 2014-01-02 MED ORDER — SIMVASTATIN 10 MG PO TABS: 10.0000 mg | ORAL_TABLET | Freq: Every day | ORAL | Status: DC

## 2014-01-02 MED ORDER — GLUCOSE BLOOD VI STRP
ORAL_STRIP | Status: DC
Start: 2014-01-02 — End: 2015-01-29

## 2014-01-02 MED ORDER — FINASTERIDE 5 MG PO TABS: 5.0000 mg | ORAL_TABLET | Freq: Every day | ORAL | Status: DC

## 2014-01-02 MED ORDER — PIOGLITAZONE HCL 45 MG PO TABS: ORAL_TABLET | ORAL | Status: DC

## 2014-01-02 MED ORDER — LISINOPRIL-HYDROCHLOROTHIAZIDE 10-12.5 MG PO TABS: 1.0000 | ORAL_TABLET | Freq: Every day | ORAL | Status: DC

## 2014-01-02 MED ORDER — FREESTYLE LANCETS MISC
Status: DC
Start: 2014-01-02 — End: 2015-01-29

## 2014-01-02 NOTE — Progress Notes (Signed)
   Subjective:    Patient ID: Justin [name], male    DOB: May 09, 1951, 63 y.o.   MRN: [id number]  HPI He is here for a diabetes recheck. He has had difficulty for the last several months with left shoulder pain no history of injury. It bothers him especially when he abducts and externally rotates. No numbness, tingling or weakness. He is also here for a diabetes recheck. His blood sugars usually run in the low 100s. His eating habits are unchanged. He continues on medications listed in the chart. Exercise is quite minimal. He did not get his eye exam He continues to smoke and is not interested in quitting. Drinks occasionally. He is now working 12 hours per day. His marriage is going well. He has no other concerns or complaints. His last colonoscopy was 2012. He continues on Proscar for his BPH. Review of Systems     Objective:   Physical Exam Alert and in no distress. Drop arm test caused pain. No tenderness to palpation over a.c. joint or bicipital groove. Negative sulcus test. Neer's and Hawkins' test was positive. Supraspinatus testing did cause discomfort. Foot exam was negative Hemoglobin A1c is 6.0     Assessment & Plan:  Diabetes mellitus - Plan: HgB A1c, CBC with Differential, Comprehensive metabolic panel, POCT UA - Microalbumin, Lancets (FREESTYLE) lancets, glucose blood test strip, pioglitazone (ACTOS) 45 MG tablet  Hypertension associated with diabetes - Plan: lisinopril-hydrochlorothiazide (PRINZIDE,ZESTORETIC) 10-12.5 MG per tablet  Hyperlipidemia LDL goal <70 - Plan: Lipid panel, simvastatin (ZOCOR) 10 MG tablet  BPH (benign prostatic hyperplasia) - Plan: finasteride (PROSCAR) 5 MG tablet  Current smoker  Hx of colonic polyps  Need for prophylactic vaccination against Streptococcus pneumoniae (pneumococcus) - Plan: CANCELED: Pneumococcal polysaccharide vaccine 23-valent greater than or equal to 2yo subcutaneous/IM  Supraspinatus tendinitis  the left shoulder was  prepped with Betadine. 3 cc of Xylocaine and 40 mg of Kenalog was injected into the subacromial bursa without difficulty. He obtained relief within several minutes. He will continue on his present medications. I again discussed smoking cessation and he is not interested.

## 2014-01-03 LAB — COMPREHENSIVE METABOLIC PANEL
ALT: 15 U/L (ref 0–53)
AST: 15 U/L (ref 0–37)
Albumin: 4.3 g/dL (ref 3.5–5.2)
Alkaline Phosphatase: 82 U/L (ref 39–117)
BILIRUBIN TOTAL: 0.5 mg/dL (ref 0.2–1.2)
BUN: 13 mg/dL (ref 6–23)
CO2: 23 mEq/L (ref 19–32)
CREATININE: 0.78 mg/dL (ref 0.50–1.35)
Calcium: 9.3 mg/dL (ref 8.4–10.5)
Chloride: 105 mEq/L (ref 96–112)
Glucose, Bld: 122 mg/dL — ABNORMAL HIGH (ref 70–99)
Potassium: 3.9 mEq/L (ref 3.5–5.3)
SODIUM: 136 meq/L (ref 135–145)
Total Protein: 6.9 g/dL (ref 6.0–8.3)

## 2014-01-03 LAB — LIPID PANEL
CHOLESTEROL: 121 mg/dL (ref 0–200)
HDL: 25 mg/dL — ABNORMAL LOW (ref >39)
LDL Cholesterol: 73 mg/dL (ref 0–99)
Total CHOL/HDL Ratio: 4.8 Ratio
Triglycerides: 115 mg/dL (ref <150)
VLDL: 23 mg/dL (ref 0–40)

## 2014-05-04 ENCOUNTER — Ambulatory Visit: Payer: BC Managed Care – PPO | Admitting: Family Medicine

## 2014-05-22 ENCOUNTER — Ambulatory Visit (INDEPENDENT_AMBULATORY_CARE_PROVIDER_SITE_OTHER): Payer: BC Managed Care – PPO | Admitting: Family Medicine

## 2014-05-22 ENCOUNTER — Encounter: Payer: Self-pay | Admitting: Family Medicine

## 2014-05-22 VITALS — BP 112/70 | HR 76 | Wt 218.0 lb

## 2014-05-22 DIAGNOSIS — Z23 Encounter for immunization: Secondary | ICD-10-CM

## 2014-05-22 DIAGNOSIS — I10 Essential (primary) hypertension: Secondary | ICD-10-CM

## 2014-05-22 DIAGNOSIS — E119 Type 2 diabetes mellitus without complications: Secondary | ICD-10-CM

## 2014-05-22 DIAGNOSIS — E1159 Type 2 diabetes mellitus with other circulatory complications: Secondary | ICD-10-CM

## 2014-05-22 DIAGNOSIS — E1169 Type 2 diabetes mellitus with other specified complication: Secondary | ICD-10-CM

## 2014-05-22 DIAGNOSIS — F17200 Nicotine dependence, unspecified, uncomplicated: Secondary | ICD-10-CM

## 2014-05-22 DIAGNOSIS — E785 Hyperlipidemia, unspecified: Secondary | ICD-10-CM

## 2014-05-22 DIAGNOSIS — Z720 Tobacco use: Secondary | ICD-10-CM

## 2014-05-22 LAB — POCT GLYCOSYLATED HEMOGLOBIN (HGB A1C): Hemoglobin A1C: 6.2

## 2014-05-22 LAB — POCT UA - MICROALBUMIN
ALBUMIN/CREATININE RATIO, URINE, POC: 20.8
Creatinine, POC: 52.3 mg/dL
MICROALBUMIN (UR) POC: 10.9 mg/L

## 2014-05-22 NOTE — Progress Notes (Signed)
  Subjective:    Justin [name] is a 63 y.o. male who presents for follow-up of Type 2 diabetes mellitus.  Still smoking and no interest in quitting  Home blood sugar records: Patient test B/S one time a day 100 to120  Current symptoms/problems none Daily foot checks:   Any foot concerns: none Last eye exam: going to have done soon. He states he will do this as soon as his work schedule changes to allow time off.   Medication compliance:good Current diet:none Current exercise: none Known diabetic complications: none Cardiovascular risk factors: advanced age (older than 88 for men, 62 for women), diabetes mellitus, dyslipidemia, hypertension, male gender, sedentary lifestyle and smoking/ tobacco exposure   The following portions of the patient's history were reviewed and updated as appropriate: allergies, current medications, past medical history, past social history and problem list.  ROS as in subjective above    Objective:    Wt 218 lb (98.884 kg)   General appearence: alert, no distress, WD/WN   Lab Review Lab Results  Component Value Date   HGBA1C 6.0 01/02/2014   Lab Results  Component Value Date   CHOL 121 01/02/2014   HDL 25* 01/02/2014   LDLCALC 73 01/02/2014   TRIG 115 01/02/2014   CHOLHDL 4.8 01/02/2014   No results found for this basenameDerl Barrow     Chemistry      Component Value Date/Time   NA 136 01/02/2014 0946   K 3.9 01/02/2014 0946   CL 105 01/02/2014 0946   CO2 23 01/02/2014 0946   BUN 13 01/02/2014 0946   CREATININE 0.78 01/02/2014 0946      Component Value Date/Time   CALCIUM 9.3 01/02/2014 0946   ALKPHOS 82 01/02/2014 0946   AST 15 01/02/2014 0946   ALT 15 01/02/2014 0946   BILITOT 0.5 01/02/2014 0946        Chemistry      Component Value Date/Time   NA 136 01/02/2014 0946   K 3.9 01/02/2014 0946   CL 105 01/02/2014 0946   CO2 23 01/02/2014 0946   BUN 13 01/02/2014 0946   CREATININE 0.78 01/02/2014 0946      Component Value  Date/Time   CALCIUM 9.3 01/02/2014 0946   ALKPHOS 82 01/02/2014 0946   AST 15 01/02/2014 0946   ALT 15 01/02/2014 0946   BILITOT 0.5 01/02/2014 0946      Hemoglobin A1c 6.2     Assessment:  Need for prophylactic vaccination against Streptococcus pneumoniae (pneumococcus) - Plan: Pneumococcal conjugate vaccine 13-valent  Need for prophylactic vaccination and inoculation against influenza - Plan: Flu Vaccine QUAD 36+ mos PF IM (Fluarix Quad PF), CANCELED: Flu vaccine HIGH DOSE PF (Fluzone Tri High dose)  Current smoker  Type 2 diabetes mellitus without complication - Plan: POCT glycosylated hemoglobin (Hb A1C), POCT UA - Microalbumin  Hyperlipidemia LDL goal <70  Hypertension associated with diabetes        Plan:    1.  Rx changes: none 2.  Education: Reviewed 'ABCs' of diabetes management (respective goals in parentheses):  A1C (<7), blood pressure (<130/80), and cholesterol (LDL <100). 3.  Compliance at present is estimated to be good. Efforts to improve compliance (if necessary) will be directed at increased exercise. 4. Follow up: 4 months

## 2014-07-03 ENCOUNTER — Other Ambulatory Visit: Payer: Self-pay | Admitting: Family Medicine

## 2014-09-25 ENCOUNTER — Ambulatory Visit (INDEPENDENT_AMBULATORY_CARE_PROVIDER_SITE_OTHER): Payer: BLUE CROSS/BLUE SHIELD | Admitting: Family Medicine

## 2014-09-25 ENCOUNTER — Encounter: Payer: Self-pay | Admitting: Family Medicine

## 2014-09-25 VITALS — BP 126/86 | HR 111 | Wt 217.0 lb

## 2014-09-25 DIAGNOSIS — I10 Essential (primary) hypertension: Secondary | ICD-10-CM

## 2014-09-25 DIAGNOSIS — F17200 Nicotine dependence, unspecified, uncomplicated: Secondary | ICD-10-CM

## 2014-09-25 DIAGNOSIS — E1159 Type 2 diabetes mellitus with other circulatory complications: Secondary | ICD-10-CM

## 2014-09-25 DIAGNOSIS — N400 Enlarged prostate without lower urinary tract symptoms: Secondary | ICD-10-CM

## 2014-09-25 DIAGNOSIS — I152 Hypertension secondary to endocrine disorders: Secondary | ICD-10-CM

## 2014-09-25 DIAGNOSIS — E1169 Type 2 diabetes mellitus with other specified complication: Secondary | ICD-10-CM | POA: Diagnosis not present

## 2014-09-25 DIAGNOSIS — E785 Hyperlipidemia, unspecified: Secondary | ICD-10-CM | POA: Diagnosis not present

## 2014-09-25 DIAGNOSIS — E119 Type 2 diabetes mellitus without complications: Secondary | ICD-10-CM | POA: Diagnosis not present

## 2014-09-25 DIAGNOSIS — Z720 Tobacco use: Secondary | ICD-10-CM

## 2014-09-25 LAB — POCT GLYCOSYLATED HEMOGLOBIN (HGB A1C): HEMOGLOBIN A1C: 6.8

## 2014-09-25 NOTE — Progress Notes (Signed)
  Subjective:    Patient ID: Justin [name], male    DOB: [birth year], 64 y.o.   MRN: [id number]  Justin [name] is a 65 y.o. male who presents for follow-up of Type 2 diabetes mellitus.  Home blood sugar records: Patient test one time a day Current symptoms/problems none Daily foot checks:   Any foot concerns: Yes/feet jump at night while he's asleep Exercise: none EYE: no has not done yet but does plan to set up an appointment. The following portions of the patient's history were reviewed and updated as appropriate: allergies, current medications, past medical history, past social history and problem list.  ROS as in subjective above.     Objective:    Physical Exam Alert and in no distress otherwise not examined.  Lab Review Diabetic Labs Latest Ref Rng 05/22/2014 01/02/2014 09/05/2013 04/25/2013 12/20/2012  HbA1c - 6.2 6.0 6.6 6.3 6.0  Chol 0 - 200 mg/dL - 121 - - 98  HDL >39 mg/dL - 25(L) - - 28(L)  Calc LDL 0 - 99 mg/dL - 73 - - 55  Triglycerides <150 mg/dL - 115 - - 77  Creatinine 0.50 - 1.35 mg/dL - 0.78 - - 0.80   BP/Weight 05/22/2014 01/02/2014 09/05/2013 92/07/1939 01/24/813  Systolic BP 481 856 314 970 263  Diastolic BP 70 80 82 70 785  Wt. (Lbs) 218 216 210 211 -  BMI 31.28 30.99 30.13 30.28 -   Foot/eye exam completion dates 01/02/2014 12/20/2012  Foot Form Completion Done Done    Justin [name]  reports that he has been smoking Cigarettes.  He has a 36 pack-year smoking history. He has never used smokeless tobacco. He reports that he drinks alcohol. He reports that he does not use illicit drugs. A1C 6.8     Assessment & Plan:     Encounter Diagnoses  Name Primary?  . Diabetes mellitus without complication Yes  . Hypertension associated with diabetes   . Hyperlipidemia LDL goal <70   . Type 2 diabetes mellitus without complication   . Current smoker   . BPH (benign prostatic hyperplasia)     Diabetes mellitus without complication - Plan: POCT glycosylated hemoglobin (Hb  A1C)   Rx changes: none  Education: Reviewed 'ABCs' of diabetes management (respective goals in parentheses):  A1C (<7), blood pressure (<130/80), and cholesterol (LDL <100).  Compliance at present is estimated to be good. Efforts to improve compliance (if necessary) will be directed at Quitting smoking.  Follow up: 4 months

## 2014-10-06 ENCOUNTER — Other Ambulatory Visit: Payer: Self-pay

## 2014-11-15 ENCOUNTER — Other Ambulatory Visit: Payer: Self-pay | Admitting: Family Medicine

## 2014-12-13 ENCOUNTER — Other Ambulatory Visit: Payer: Self-pay | Admitting: Family Medicine

## 2015-01-29 ENCOUNTER — Ambulatory Visit (INDEPENDENT_AMBULATORY_CARE_PROVIDER_SITE_OTHER): Payer: BLUE CROSS/BLUE SHIELD | Admitting: Family Medicine

## 2015-01-29 ENCOUNTER — Encounter: Payer: Self-pay | Admitting: Family Medicine

## 2015-01-29 VITALS — BP 100/70 | HR 87 | Wt 215.0 lb

## 2015-01-29 DIAGNOSIS — Z720 Tobacco use: Secondary | ICD-10-CM

## 2015-01-29 DIAGNOSIS — E1169 Type 2 diabetes mellitus with other specified complication: Secondary | ICD-10-CM | POA: Diagnosis not present

## 2015-01-29 DIAGNOSIS — E785 Hyperlipidemia, unspecified: Secondary | ICD-10-CM

## 2015-01-29 DIAGNOSIS — N400 Enlarged prostate without lower urinary tract symptoms: Secondary | ICD-10-CM

## 2015-01-29 DIAGNOSIS — Z86010 Personal history of colonic polyps: Secondary | ICD-10-CM | POA: Diagnosis not present

## 2015-01-29 DIAGNOSIS — I10 Essential (primary) hypertension: Secondary | ICD-10-CM

## 2015-01-29 DIAGNOSIS — E119 Type 2 diabetes mellitus without complications: Secondary | ICD-10-CM | POA: Diagnosis not present

## 2015-01-29 DIAGNOSIS — F17200 Nicotine dependence, unspecified, uncomplicated: Secondary | ICD-10-CM

## 2015-01-29 DIAGNOSIS — E1159 Type 2 diabetes mellitus with other circulatory complications: Secondary | ICD-10-CM

## 2015-01-29 LAB — CBC WITH DIFFERENTIAL/PLATELET
Basophils Absolute: 0 10*3/uL (ref 0.0–0.1)
Basophils Relative: 0 % (ref 0–1)
EOS PCT: 2 % (ref 0–5)
Eosinophils Absolute: 0.3 10*3/uL (ref 0.0–0.7)
HCT: 47.8 % (ref 39.0–52.0)
Hemoglobin: 16.1 g/dL (ref 13.0–17.0)
LYMPHS PCT: 23 % (ref 12–46)
Lymphs Abs: 3.3 10*3/uL (ref 0.7–4.0)
MCH: 30.7 pg (ref 26.0–34.0)
MCHC: 33.7 g/dL (ref 30.0–36.0)
MCV: 91 fL (ref 78.0–100.0)
MPV: 10.9 fL (ref 8.6–12.4)
Monocytes Absolute: 1.1 10*3/uL — ABNORMAL HIGH (ref 0.1–1.0)
Monocytes Relative: 8 % (ref 3–12)
NEUTROS PCT: 67 % (ref 43–77)
Neutro Abs: 9.6 10*3/uL — ABNORMAL HIGH (ref 1.7–7.7)
Platelets: 295 10*3/uL (ref 150–400)
RBC: 5.25 MIL/uL (ref 4.22–5.81)
RDW: 13.3 % (ref 11.5–15.5)
WBC: 14.3 10*3/uL — ABNORMAL HIGH (ref 4.0–10.5)

## 2015-01-29 LAB — LIPID PANEL
Cholesterol: 110 mg/dL (ref 0–200)
HDL: 24 mg/dL — ABNORMAL LOW (ref >=40)
LDL Cholesterol: 63 mg/dL (ref 0–99)
TRIGLYCERIDES: 115 mg/dL (ref <150)
Total CHOL/HDL Ratio: 4.6 Ratio
VLDL: 23 mg/dL (ref 0–40)

## 2015-01-29 LAB — COMPREHENSIVE METABOLIC PANEL
ALK PHOS: 99 U/L (ref 39–117)
ALT: 15 U/L (ref 0–53)
AST: 16 U/L (ref 0–37)
Albumin: 4.4 g/dL (ref 3.5–5.2)
BILIRUBIN TOTAL: 0.6 mg/dL (ref 0.2–1.2)
BUN: 19 mg/dL (ref 6–23)
CO2: 22 mEq/L (ref 19–32)
Calcium: 9.6 mg/dL (ref 8.4–10.5)
Chloride: 103 mEq/L (ref 96–112)
Creat: 1.05 mg/dL (ref 0.50–1.35)
Glucose, Bld: 104 mg/dL — ABNORMAL HIGH (ref 70–99)
Potassium: 3.7 mEq/L (ref 3.5–5.3)
Sodium: 138 mEq/L (ref 135–145)
Total Protein: 7.4 g/dL (ref 6.0–8.3)

## 2015-01-29 LAB — POCT GLYCOSYLATED HEMOGLOBIN (HGB A1C): HEMOGLOBIN A1C: 6.4

## 2015-01-29 MED ORDER — SIMVASTATIN 10 MG PO TABS: 10.0000 mg | ORAL_TABLET | Freq: Every day | ORAL | Status: DC

## 2015-01-29 MED ORDER — FREESTYLE LANCETS MISC: Status: DC

## 2015-01-29 MED ORDER — LISINOPRIL-HYDROCHLOROTHIAZIDE 10-12.5 MG PO TABS: 1.0000 | ORAL_TABLET | Freq: Every day | ORAL | Status: DC

## 2015-01-29 MED ORDER — GLUCOSE BLOOD VI STRP: ORAL_STRIP | Status: DC

## 2015-01-29 MED ORDER — FINASTERIDE 5 MG PO TABS: 5.0000 mg | ORAL_TABLET | Freq: Every day | ORAL | Status: DC

## 2015-01-29 MED ORDER — PIOGLITAZONE HCL 45 MG PO TABS: 45.0000 mg | ORAL_TABLET | Freq: Every day | ORAL | Status: DC

## 2015-01-29 NOTE — Progress Notes (Signed)
  Subjective:    Patient ID: Justin [name], male    DOB: 1950-11-10, 64 y.o.   MRN: [id number]  Justin [name] is a 64 y.o. male who presents for follow-up of Type 2 diabetes mellitus.He does have a history of colonic polyps and is scheduled for repeat colonoscopy at the end of the year. He continues on Proscar and is having no difficulty with that. He also continues on his other blood pressure medications.  Home blood sugar records: Patient test one time a day Current symptoms/problems none Daily foot checks:yes   Any foot concerns:  none Exercise: working Eye exam ? Doesn't have time right now  The following portions of the patient's history were reviewed and updated as appropriate: allergies, current medications, past medical history, past social history and problem list.  ROS as in subjective above.     Objective:    Physical Exam Alert and in no distress otherwise not examined.   Lab Review Diabetic Labs Latest Ref Rng 09/25/2014 05/22/2014 01/02/2014 09/05/2013 04/25/2013  HbA1c - 6.8 6.2 6.0 6.6 6.3  Chol 0 - 200 mg/dL - - 121 - -  HDL >39 mg/dL - - 25(L) - -  Calc LDL 0 - 99 mg/dL - - 73 - -  Triglycerides <150 mg/dL - - 115 - -  Creatinine 0.50 - 1.35 mg/dL - - 0.78 - -   BP/Weight 09/25/2014 05/22/2014 01/02/2014 09/05/2013 93/11/7015  Systolic BP 793 903 009 233 007  Diastolic BP 86 70 80 82 70  Wt. (Lbs) 217 218 216 210 211  BMI 31.14 31.28 30.99 30.13 30.28   Foot/eye exam completion dates 01/02/2014 12/20/2012  Foot Form Completion Done Done    [name]  reports that he has been smoking Cigarettes.  He has a 36 pack-year smoking history. He has never used smokeless tobacco. He reports that he drinks alcohol. He reports that he does not use illicit drugs. States that he has cut back to half a pack per day.   HbA1c 6.4 Assessment & Plan:    Type 2 diabetes mellitus without complication - Plan: POCT glycosylated hemoglobin (Hb A1C), CBC with Differential/Platelet,  Comprehensive metabolic panel, Lipid panel, POCT UA - Microalbumin, pioglitazone (ACTOS) 45 MG tablet, Lancets (FREESTYLE) lancets, glucose blood test strip  Hypertension associated with diabetes - Plan: lisinopril-hydrochlorothiazide (PRINZIDE,ZESTORETIC) 10-12.5 MG per tablet  Hyperlipidemia associated with type 2 diabetes mellitus - Plan: Lipid panel, simvastatin (ZOCOR) 10 MG tablet  Current smoker  History of colonic polyps  BPH (benign prostatic hyperplasia) - Plan: finasteride (PROSCAR) 5 MG tablet   1. Rx changes: none 2. Education: Reviewed 'ABCs' of diabetes management (respective goals in parentheses):  A1C (<7), blood pressure (<130/80), and cholesterol (LDL <100). 3. Compliance at present is estimated to be good. Efforts to improve compliance (if necessary) will be directed at increased exercise. 4. Follow up: 4 months I did complemented him on his cutting back on cigarettes. Encouraged him to continue to try to eliminate entirely. Overall he seems to be doing a better job of taking care of himself. I will discuss follow-up on his smoking with CT scan with his next visit.

## 2015-06-01 ENCOUNTER — Ambulatory Visit: Payer: BLUE CROSS/BLUE SHIELD | Admitting: Family Medicine

## 2015-06-01 ENCOUNTER — Ambulatory Visit (INDEPENDENT_AMBULATORY_CARE_PROVIDER_SITE_OTHER): Payer: BLUE CROSS/BLUE SHIELD | Admitting: Family Medicine

## 2015-06-01 ENCOUNTER — Encounter: Payer: Self-pay | Admitting: Family Medicine

## 2015-06-01 VITALS — BP 130/80 | HR 78 | Resp 14 | Wt 218.0 lb

## 2015-06-01 DIAGNOSIS — I10 Essential (primary) hypertension: Secondary | ICD-10-CM | POA: Diagnosis not present

## 2015-06-01 DIAGNOSIS — Z720 Tobacco use: Secondary | ICD-10-CM | POA: Diagnosis not present

## 2015-06-01 DIAGNOSIS — E785 Hyperlipidemia, unspecified: Secondary | ICD-10-CM | POA: Diagnosis not present

## 2015-06-01 DIAGNOSIS — J302 Other seasonal allergic rhinitis: Secondary | ICD-10-CM | POA: Diagnosis not present

## 2015-06-01 DIAGNOSIS — F17200 Nicotine dependence, unspecified, uncomplicated: Secondary | ICD-10-CM

## 2015-06-01 DIAGNOSIS — G2581 Restless legs syndrome: Secondary | ICD-10-CM

## 2015-06-01 DIAGNOSIS — E1169 Type 2 diabetes mellitus with other specified complication: Secondary | ICD-10-CM

## 2015-06-01 DIAGNOSIS — E1159 Type 2 diabetes mellitus with other circulatory complications: Secondary | ICD-10-CM

## 2015-06-01 DIAGNOSIS — E118 Type 2 diabetes mellitus with unspecified complications: Secondary | ICD-10-CM

## 2015-06-01 DIAGNOSIS — Z23 Encounter for immunization: Secondary | ICD-10-CM | POA: Diagnosis not present

## 2015-06-01 LAB — POCT GLYCOSYLATED HEMOGLOBIN (HGB A1C): Hemoglobin A1C: 6.6

## 2015-06-01 MED ORDER — PRAMIPEXOLE DIHYDROCHLORIDE 0.125 MG PO TABS
0.1250 mg | ORAL_TABLET | Freq: Every day | ORAL | Status: DC
Start: 2015-06-01 — End: 2015-09-30

## 2015-06-01 NOTE — Progress Notes (Signed)
   Subjective:    Patient ID: Justin [name], male    DOB: 12-08-1950, 64 y.o.   MRN: [id number]  HPI He is here for recheck on his diabetes. He states his blood sugars run in the 118 or lower range. He does not exercise regularly. His eating habits are unchanged. He plans to have an eye exam done over the Christmas holidays. He does check his feet regularly. He continues on medications listed in the chart. He smokes and is not ready to quit smoking. His allergies are under good control. He continues on medications listed in the chart. Health maintenance, family and social history as well as immunizations were reviewed. He does complain of a several month history of restless legs especially when he lays down at night. It does interfere with his ability to sleep.   Review of Systems     Objective:   Physical Exam Alert and in no distress. Hemoglobin A1c is 6.6.       Assessment & Plan:  Type 2 diabetes mellitus with complication, without long-term current use of insulin ([facility]) - Plan: POCT glycosylated hemoglobin (Hb A1C)  Hyperlipidemia associated with type 2 diabetes mellitus ([place])  Hypertension associated with diabetes ([place])  Current smoker  Other seasonal allergic rhinitis  Need for prophylactic vaccination and inoculation against influenza - Plan: Flu Vaccine QUAD 36+ mos IM  RLS (restless legs syndrome) - Plan: pramipexole (MIRAPEX) 0.125 MG tablet  I will try him on Mirapex. He is to let me know if this works. Briefly discussed smoking and again he is not interested in quitting at this time.

## 2015-06-01 NOTE — Patient Instructions (Signed)
Let me know how the Mirapex is working

## 2015-06-04 ENCOUNTER — Ambulatory Visit: Payer: BLUE CROSS/BLUE SHIELD | Admitting: Family Medicine

## 2015-09-30 ENCOUNTER — Ambulatory Visit (INDEPENDENT_AMBULATORY_CARE_PROVIDER_SITE_OTHER): Payer: BLUE CROSS/BLUE SHIELD | Admitting: Family Medicine

## 2015-09-30 ENCOUNTER — Encounter: Payer: Self-pay | Admitting: Family Medicine

## 2015-09-30 VITALS — BP 132/72 | HR 96 | Ht 69.0 in | Wt 214.4 lb

## 2015-09-30 DIAGNOSIS — E1159 Type 2 diabetes mellitus with other circulatory complications: Secondary | ICD-10-CM | POA: Diagnosis not present

## 2015-09-30 DIAGNOSIS — E1169 Type 2 diabetes mellitus with other specified complication: Secondary | ICD-10-CM | POA: Diagnosis not present

## 2015-09-30 DIAGNOSIS — G2581 Restless legs syndrome: Secondary | ICD-10-CM

## 2015-09-30 DIAGNOSIS — I152 Hypertension secondary to endocrine disorders: Secondary | ICD-10-CM

## 2015-09-30 DIAGNOSIS — I10 Essential (primary) hypertension: Secondary | ICD-10-CM | POA: Diagnosis not present

## 2015-09-30 DIAGNOSIS — F17200 Nicotine dependence, unspecified, uncomplicated: Secondary | ICD-10-CM

## 2015-09-30 DIAGNOSIS — Z720 Tobacco use: Secondary | ICD-10-CM

## 2015-09-30 DIAGNOSIS — E118 Type 2 diabetes mellitus with unspecified complications: Secondary | ICD-10-CM

## 2015-09-30 DIAGNOSIS — E785 Hyperlipidemia, unspecified: Secondary | ICD-10-CM

## 2015-09-30 LAB — POCT GLYCOSYLATED HEMOGLOBIN (HGB A1C): Hemoglobin A1C: 6.5

## 2015-09-30 NOTE — Progress Notes (Signed)
  Subjective:    Patient ID: Justin [name], male    DOB: 1951/03/10, 65 y.o.   MRN: [id number]  Justin [name] is a 65 y.o. male who presents for follow-up of Type 2 diabetes mellitus.  Home blood sugar records: fasting range: low 100s Current symptoms/problems include none and have been resolved. Daily foot checks:   Any foot concerns: He has noted some restless leg symptoms that do improve with physical activity Exercise: The patient does not participate in regular exercise at present. Diet:regular He does have a history of RLS and had been on Mirapex in the past but he found it to not be useful. He has gotten more benefit out of increasing his exercise which she is now doing. 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 He has lost 5 pounds during the same time frame. He smokes and again is not interested in quitting. Continues on his blood pressure medications as well as statin.he will set up for an eye exam in the near future.The following portions of the patient's history were reviewed and updated as appropriate: allergies, current medications, past medical history, past social history and problem list.  ROS as in subjective above.     Objective:    Physical Exam Alert and in no distress otherwise not examined.  Blood pressure 132/72, pulse 96, height 5\' 9"  (1.753 m), weight 214 lb 6.4 oz (97.251 kg).  Lab Review Diabetic Labs Latest Ref Rng 09/30/2015 06/01/2015 01/29/2015 09/25/2014 05/22/2014  HbA1c - 6.5 6.6 6.4 6.8 6.2  Chol 0 - 200 mg/dL - - 110 - -  HDL >=40 mg/dL - - 24(L) - -  Calc LDL 0 - 99 mg/dL - - 63 - -  Triglycerides <150 mg/dL - - 115 - -  Creatinine 0.50 - 1.35 mg/dL - - 1.05 - -   BP/Weight 09/30/2015 06/01/2015 01/29/2015 09/25/2014 [id number]  Systolic BP [id number]  Diastolic BP 72 80 70 86 70  Wt. (Lbs) 214.4 218 215 217 218  BMI 31.65 31.28 30.85 31.14 31.28   Foot/eye exam completion dates 01/29/2015  01/02/2014  Foot Form Completion Done Done  Justin [name] A1c is 6.5  Justin [name]  reports that he has been smoking Cigarettes.  He has a 36 pack-year smoking history. He has never used smokeless tobacco. He reports that he drinks alcohol. He reports that he does not use illicit drugs.     Assessment & Plan:    Diabetic complication ([place]) - Plan: HgB A1c  Hypertension associated with diabetes ([place])  Hyperlipidemia associated with type 2 diabetes mellitus ([place])  Type 2 diabetes mellitus with complication, without long-term current use of insulin ([place])  Current smoker   Rx changes: none  Education: Reviewed 'ABCs' of diabetes management (respective goals in parentheses):  A1C (<7), blood pressure (<130/80), and cholesterol (LDL <100).  Compliance at present is estimated to be good. Efforts to improve compliance (if necessary) will be directed at increased exercise.  Follow up: 4 months He will continue on his present medication regimen. He will not be taking Mirapex as it does not seem to be working.

## 2016-01-28 ENCOUNTER — Ambulatory Visit (INDEPENDENT_AMBULATORY_CARE_PROVIDER_SITE_OTHER): Payer: BLUE CROSS/BLUE SHIELD | Admitting: Family Medicine

## 2016-01-28 ENCOUNTER — Encounter: Payer: Self-pay | Admitting: Family Medicine

## 2016-01-28 VITALS — BP 120/80 | HR 84 | Ht 69.0 in | Wt 213.6 lb

## 2016-01-28 DIAGNOSIS — N400 Enlarged prostate without lower urinary tract symptoms: Secondary | ICD-10-CM | POA: Diagnosis not present

## 2016-01-28 DIAGNOSIS — F17200 Nicotine dependence, unspecified, uncomplicated: Secondary | ICD-10-CM

## 2016-01-28 DIAGNOSIS — K5730 Diverticulosis of large intestine without perforation or abscess without bleeding: Secondary | ICD-10-CM

## 2016-01-28 DIAGNOSIS — I10 Essential (primary) hypertension: Secondary | ICD-10-CM | POA: Diagnosis not present

## 2016-01-28 DIAGNOSIS — E1169 Type 2 diabetes mellitus with other specified complication: Secondary | ICD-10-CM | POA: Diagnosis not present

## 2016-01-28 DIAGNOSIS — J302 Other seasonal allergic rhinitis: Secondary | ICD-10-CM

## 2016-01-28 DIAGNOSIS — E1159 Type 2 diabetes mellitus with other circulatory complications: Secondary | ICD-10-CM

## 2016-01-28 DIAGNOSIS — E119 Type 2 diabetes mellitus without complications: Secondary | ICD-10-CM | POA: Diagnosis not present

## 2016-01-28 DIAGNOSIS — E118 Type 2 diabetes mellitus with unspecified complications: Secondary | ICD-10-CM

## 2016-01-28 DIAGNOSIS — Z87442 Personal history of urinary calculi: Secondary | ICD-10-CM

## 2016-01-28 DIAGNOSIS — Z720 Tobacco use: Secondary | ICD-10-CM | POA: Diagnosis not present

## 2016-01-28 DIAGNOSIS — E1121 Type 2 diabetes mellitus with diabetic nephropathy: Secondary | ICD-10-CM | POA: Diagnosis not present

## 2016-01-28 DIAGNOSIS — E785 Hyperlipidemia, unspecified: Secondary | ICD-10-CM

## 2016-01-28 LAB — POCT UA - MICROALBUMIN
Albumin/Creatinine Ratio, Urine, POC: 83.2
CREATININE, POC: 65.5 mg/dL
MICROALBUMIN (UR) POC: 54.5 mg/L

## 2016-01-28 LAB — CBC WITH DIFFERENTIAL/PLATELET
BASOS ABS: 0 {cells}/uL (ref 0–200)
Basophils Relative: 0 %
EOS PCT: 1 %
Eosinophils Absolute: 155 cells/uL (ref 15–500)
HCT: 47.5 % (ref 38.5–50.0)
HEMOGLOBIN: 15.9 g/dL (ref 13.2–17.1)
Lymphocytes Relative: 21 %
Lymphs Abs: 3255 cells/uL (ref 850–3900)
MCH: 30.3 pg (ref 27.0–33.0)
MCHC: 33.5 g/dL (ref 32.0–36.0)
MCV: 90.6 fL (ref 80.0–100.0)
MONO ABS: 1240 {cells}/uL — AB (ref 200–950)
MPV: 10.8 fL (ref 7.5–12.5)
Monocytes Relative: 8 %
NEUTROS ABS: 10850 {cells}/uL — AB (ref 1500–7800)
Neutrophils Relative %: 70 %
Platelets: 300 10*3/uL (ref 140–400)
RBC: 5.24 MIL/uL (ref 4.20–5.80)
RDW: 13.7 % (ref 11.0–15.0)
WBC: 15.5 10*3/uL — ABNORMAL HIGH (ref 4.0–10.5)

## 2016-01-28 LAB — POCT GLYCOSYLATED HEMOGLOBIN (HGB A1C): Hemoglobin A1C: 6.4

## 2016-01-28 MED ORDER — FREESTYLE LANCETS MISC: Status: AC

## 2016-01-28 MED ORDER — GLUCOSE BLOOD VI STRP: ORAL_STRIP | Status: AC

## 2016-01-28 MED ORDER — FINASTERIDE 5 MG PO TABS: 5.0000 mg | ORAL_TABLET | Freq: Every day | ORAL | Status: DC

## 2016-01-28 MED ORDER — IBUPROFEN 100 MG PO CHEW
100.0000 mg | CHEWABLE_TABLET | Freq: Three times a day (TID) | ORAL | Status: DC | PRN
Start: 2016-01-28 — End: 2017-03-20

## 2016-01-28 MED ORDER — SIMVASTATIN 10 MG PO TABS: 10.0000 mg | ORAL_TABLET | Freq: Every day | ORAL | Status: AC

## 2016-01-28 MED ORDER — LISINOPRIL-HYDROCHLOROTHIAZIDE 10-12.5 MG PO TABS: 1.0000 | ORAL_TABLET | Freq: Every day | ORAL | Status: DC

## 2016-01-28 MED ORDER — PIOGLITAZONE HCL 45 MG PO TABS: 45.0000 mg | ORAL_TABLET | Freq: Every day | ORAL | Status: DC

## 2016-01-28 NOTE — Progress Notes (Signed)
Subjective:    Patient ID: Justin [name], male    DOB: 05/11/1951, 65 y.o.   MRN: [id number]  Justin [name] is a 65 y.o. male who presents for follow-up of Type 2 diabetes mellitus.  Patient is checking home blood sugars.   Home blood sugar records: 80 to 120 How often is blood sugars being checked: once in a while Current symptoms/problems frequent  Urine  Daily foot checks:yes  Any foot concerns: heels hurt Last eye exam: hasnt had it done yet Exercise: working, garden  He continues to smoke and again is not interested in quitting. He continues on his blood pressure medications as well as statin. Not taking OTC medicine for his nocturia and states that this is helping. He continues on finasteride or his BPH.  He's had no abdominal pain, nausea or vomiting. Does complain of a chronic cough that usually mainly in the morning. Also also previous history of kidney stones. He's had no symptoms of kidney stones. Allergies are under good control. The following portions of the patient's history were reviewed and updated as appropriate: allergies, current medications, past medical history, past social history and problem list.  ROS as in subjective above.     Objective:    Physical Exam Alert and in no distress otherwise not examined.  Weight 213 lb 9.6 oz (96.888 kg).  Lab Review Diabetic Labs Latest Ref Rng 09/30/2015 06/01/2015 01/29/2015 09/25/2014 05/22/2014  HbA1c - 6.5 6.6 6.4 6.8 6.2  Chol 0 - 200 mg/dL - - 110 - -  HDL >=40 mg/dL - - 24(L) - -  Calc LDL 0 - 99 mg/dL - - 63 - -  Triglycerides <150 mg/dL - - 115 - -  Creatinine 0.50 - 1.35 mg/dL - - 1.05 - -   BP/Weight 01/28/2016 09/30/2015 06/01/2015 [id number]  Systolic BP - Q000111Q [id number]  Diastolic BP - 72 80 70 86  Wt. (Lbs) 213.6 214.4 218 215 217  BMI 31.53 31.65 31.28 30.85 31.14   Foot/eye exam completion dates 01/29/2015 01/02/2014  Foot Form Completion Done Done  A1C is 6.4 foot exam is normal. Justin [name]  reports that he  has been smoking Cigarettes.  He has a 36 pack-year smoking history. He has never used smokeless tobacco. He reports that he drinks alcohol. He reports that he does not use illicit drugs.     Assessment & Plan:    Hypertension associated with diabetes ([place]) - Plan: lisinopril-hydrochlorothiazide (PRINZIDE,ZESTORETIC) 10-12.5 MG tablet  Other seasonal allergic rhinitis  Diverticulosis of large intestine without hemorrhage  Current smoker - Plan: CANCELED: US Aorta, CANCELED: Ambulatory Referral for Lung Cancer Scre  Hyperlipidemia associated with type 2 diabetes mellitus ([place]) - Plan: simvastatin (ZOCOR) 10 MG tablet, Lipid panel  Type 2 diabetes mellitus with complication, without long-term current use of insulin ([place]) - Plan: POCT glycosylated hemoglobin (Hb A1C), POCT UA - Microalbumin, CBC with Differential/Platelet, Lipid panel, POCT UA - Microalbumin  BPH (benign prostatic hyperplasia) - Plan: finasteride (PROSCAR) 5 MG tablet  History of renal stone  Type 2 diabetes mellitus without complication, without long-term current use of insulin ([place]) - Plan: glucose blood test strip, Lancets (FREESTYLE) lancets, pioglitazone (ACTOS) 45 MG tablet   1. Rx changes: none 2. Education: Reviewed 'ABCs' of diabetes management (respective goals in parentheses):  A1C (<7), blood pressure (<130/80), and cholesterol (LDL <100). 3. Compliance at present is estimated to be good. Efforts to improve compliance (if necessary) will be directed at increased exercise. 4.  Follow up: 4 months  5. Discussed lung cancer screening as well as abdominal aortic aneurysm and he would like to wait until he goes on Medicare to get this done. He is not interested in quitting smoking.

## 2016-01-29 LAB — LIPID PANEL
CHOL/HDL RATIO: 5.7 ratio — AB (ref <=5.0)
CHOLESTEROL: 143 mg/dL (ref 125–200)
HDL: 25 mg/dL — ABNORMAL LOW (ref >=40)
LDL Cholesterol: 91 mg/dL (ref <130)
TRIGLYCERIDES: 134 mg/dL (ref <150)
VLDL: 27 mg/dL (ref <30)

## 2016-01-30 DIAGNOSIS — E1121 Type 2 diabetes mellitus with diabetic nephropathy: Secondary | ICD-10-CM | POA: Insufficient documentation

## 2016-02-02 ENCOUNTER — Telehealth: Payer: Self-pay

## 2016-02-02 NOTE — Telephone Encounter (Signed)
Left message for mr Fusselman with [facility]rs [facility] to please call and make an appointment

## 2016-02-02 NOTE — Telephone Encounter (Signed)
Let him know that his insurance doesn't want that medicine and have him set up an appointment to discuss alternatives

## 2016-02-02 NOTE — Telephone Encounter (Signed)
Fax received from [facility] that The pioglitazone 45mg  tablets are not covered by pt's ins. They would like an alternative to be prescribed. Please advise. Victorino December

## 2016-02-07 ENCOUNTER — Ambulatory Visit (INDEPENDENT_AMBULATORY_CARE_PROVIDER_SITE_OTHER): Payer: BLUE CROSS/BLUE SHIELD | Admitting: Family Medicine

## 2016-02-07 DIAGNOSIS — E1121 Type 2 diabetes mellitus with diabetic nephropathy: Secondary | ICD-10-CM | POA: Diagnosis not present

## 2016-02-07 NOTE — Patient Instructions (Signed)
Use the medicines that you have at home and keep track of your blood sugars less if we can get through to the beginning of the year then we'll readdress.

## 2016-02-07 NOTE — Progress Notes (Signed)
   Subjective:    Patient ID: KARANVIR LEVINS, male    DOB: Jun 26, 1951, 65 y.o.   MRN: [id number]  HPI He is here for consult concerning the use of by mouth glitazone. Apparently his insurance will not cover this any more. Further discussion with him indicates that he has 3-90 day bottle still left but their concerns about the expiration date.   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 Recommended he continue on them and keep track of his blood sugars. We can then readdress the diabetes issue next year when he will be on Medicare and possibly switch him to metformin. He was comfortable with this.

## 2016-02-10 ENCOUNTER — Telehealth: Payer: Self-pay | Admitting: Family Medicine

## 2016-02-19 NOTE — Telephone Encounter (Signed)
P.A. Isabelle Course, pt needs trial of one of the following Bydureon, Byetta, Trulicity, Victoza, Janumet, [place], [place], [place], [place], [employer].  Do you want to switch?

## 2016-02-21 ENCOUNTER — Other Ambulatory Visit: Payer: Self-pay

## 2016-02-21 MED ORDER — SITAGLIPTIN PHOSPHATE 100 MG PO TABS: 100.0000 mg | ORAL_TABLET | Freq: Every day | ORAL | 1 refills | Status: DC

## 2016-02-21 NOTE — Telephone Encounter (Signed)
Let him know the insurance is getting in the way. Place him on Januvia 100 mg a day and have him follow-up with me with his next diabetes appointment

## 2016-02-21 NOTE — Telephone Encounter (Signed)
Let him know the insurance is getting in the way. Place him on Januvia 100 mg a day and have him follow-up with me with his next diabetes appointment  I have called pt left message on machine word for word

## 2016-02-21 NOTE — Telephone Encounter (Signed)
Left word for word message on machine  

## 2016-02-22 ENCOUNTER — Telehealth: Payer: Self-pay

## 2016-02-24 NOTE — Telephone Encounter (Signed)
Januvia requried P.A. Also, this was completed and faxed

## 2016-02-24 NOTE — Telephone Encounter (Signed)
error 

## 2016-03-03 NOTE — Telephone Encounter (Signed)
Januvia approved til 02/23/17, left message with daughter

## 2016-05-26 ENCOUNTER — Encounter: Payer: Self-pay | Admitting: Family Medicine

## 2016-05-26 ENCOUNTER — Ambulatory Visit (INDEPENDENT_AMBULATORY_CARE_PROVIDER_SITE_OTHER): Payer: BLUE CROSS/BLUE SHIELD | Admitting: Family Medicine

## 2016-05-26 VITALS — BP 112/70 | HR 110 | Wt 212.0 lb

## 2016-05-26 DIAGNOSIS — Z23 Encounter for immunization: Secondary | ICD-10-CM | POA: Diagnosis not present

## 2016-05-26 DIAGNOSIS — E1159 Type 2 diabetes mellitus with other circulatory complications: Secondary | ICD-10-CM | POA: Diagnosis not present

## 2016-05-26 DIAGNOSIS — F17200 Nicotine dependence, unspecified, uncomplicated: Secondary | ICD-10-CM

## 2016-05-26 DIAGNOSIS — I10 Essential (primary) hypertension: Secondary | ICD-10-CM | POA: Diagnosis not present

## 2016-05-26 DIAGNOSIS — E1169 Type 2 diabetes mellitus with other specified complication: Secondary | ICD-10-CM | POA: Diagnosis not present

## 2016-05-26 DIAGNOSIS — E1121 Type 2 diabetes mellitus with diabetic nephropathy: Secondary | ICD-10-CM | POA: Diagnosis not present

## 2016-05-26 DIAGNOSIS — E785 Hyperlipidemia, unspecified: Secondary | ICD-10-CM

## 2016-05-26 LAB — POCT GLYCOSYLATED HEMOGLOBIN (HGB A1C): Hemoglobin A1C: 6.8

## 2016-05-26 NOTE — Progress Notes (Signed)
  Subjective:    Patient ID: Justin [name], male    DOB: Jan 24, 1951, 65 y.o.   MRN: [id number]  Justin [name] is a 65 y.o. male who presents for follow-up of Type 2 diabetes mellitus.  Patient is checking home blood sugars.   Home blood sugar records: 95 to 120 How often is blood sugars being checked: every other day Current symptoms/problems none Daily foot checks: yes   Any foot concerns: none Last eye exam: not yetAnd he plans to get an eye exam when he goes on Medicare in about 4 months. Exercise: working He continues to smoke and again has no desire to quit. He continues on lisinopril and having no difficulty with this. He also is taking Januvia. He has a previous history of difficulty with metformin causing diarrhea. He continues on simvastatin without trouble. He is working 3 days per week. The following portions of the patient's history were reviewed and updated as appropriate: allergies, current medications, past medical history, past social history and problem list.  ROS as in subjective above.     Objective:    Physical Exam Alert and in no distress otherwise not examined.   Lab Review Diabetic Labs Latest Ref Rng & Units 01/28/2016 09/30/2015 06/01/2015 01/29/2015 09/25/2014  HbA1c - 6.4 6.5 6.6 6.4 6.8  Microalbumin mg/L 54.5 - - - -  Micro/Creat Ratio - 83.2 - - - -  Chol 125 - 200 mg/dL 143 - - 110 -  HDL >=40 mg/dL 25(L) - - 24(L) -  Calc LDL <130 mg/dL 91 - - 63 -  Triglycerides <150 mg/dL 134 - - 115 -  Creatinine 0.50 - 1.35 mg/dL - - - 1.05 -   BP/Weight 01/28/2016 09/30/2015 06/01/2015 [id number]  Systolic BP 123456 [id number]  Diastolic BP 80 72 80 70 86  Wt. (Lbs) 213.6 214.4 218 215 217  BMI 31.53 31.65 31.28 30.85 31.14   Foot/eye exam completion dates 01/28/2016 01/29/2015  Foot Form Completion Done Done  A1c is 6.8  Justin [name]  reports that he has been smoking Cigarettes.  He has a 36.00 pack-year smoking history. He has never used smokeless tobacco. He  reports that he drinks alcohol. He reports that he does not use drugs.     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 Rx changes: none 2. Education: Reviewed 'ABCs' of diabetes management (respective goals in parentheses):  A1C (<7), blood pressure (<130/80), and cholesterol (LDL <100). 3. Compliance at present is estimated to be good. Efforts to improve compliance (if necessary) will be directed at No change. 4. Follow up: 4 months When he goes on Medicare, he plans to get his eyes checked.

## 2016-07-21 ENCOUNTER — Ambulatory Visit (INDEPENDENT_AMBULATORY_CARE_PROVIDER_SITE_OTHER): Payer: BLUE CROSS/BLUE SHIELD | Admitting: Family Medicine

## 2016-07-21 ENCOUNTER — Ambulatory Visit
Admission: RE | Admit: 2016-07-21 | Discharge: 2016-07-21 | Disposition: A | Discharge status: Home / Self Care | Payer: Self-pay | Source: Ambulatory Visit | Attending: Family Medicine | Admitting: Family Medicine

## 2016-07-21 ENCOUNTER — Encounter: Payer: Self-pay | Admitting: Family Medicine

## 2016-07-21 VITALS — BP 130/80 | HR 90 | Wt 212.6 lb

## 2016-07-21 DIAGNOSIS — R1314 Dysphagia, pharyngoesophageal phase: Secondary | ICD-10-CM

## 2016-07-21 LAB — COMPREHENSIVE METABOLIC PANEL
ALT: 14 U/L (ref 9–46)
AST: 14 U/L (ref 10–35)
Albumin: 4.4 g/dL (ref 3.6–5.1)
Alkaline Phosphatase: 90 U/L (ref 40–115)
BUN: 13 mg/dL (ref 7–25)
CHLORIDE: 105 mmol/L (ref 98–110)
CO2: 19 mmol/L — AB (ref 20–31)
CREATININE: 0.79 mg/dL (ref 0.70–1.25)
Calcium: 9.6 mg/dL (ref 8.6–10.3)
Glucose, Bld: 107 mg/dL — ABNORMAL HIGH (ref 65–99)
Potassium: 3.8 mmol/L (ref 3.5–5.3)
SODIUM: 135 mmol/L (ref 135–146)
Total Bilirubin: 0.5 mg/dL (ref 0.2–1.2)
Total Protein: 7.4 g/dL (ref 6.1–8.1)

## 2016-07-21 LAB — CBC WITH DIFFERENTIAL/PLATELET
BASOS ABS: 0 {cells}/uL (ref 0–200)
Basophils Relative: 0 %
EOS PCT: 1 %
Eosinophils Absolute: 154 cells/uL (ref 15–500)
HCT: 48.9 % (ref 38.5–50.0)
Hemoglobin: 16.6 g/dL (ref 13.2–17.1)
Lymphocytes Relative: 13 %
Lymphs Abs: 2002 cells/uL (ref 850–3900)
MCH: 30.6 pg (ref 27.0–33.0)
MCHC: 33.9 g/dL (ref 32.0–36.0)
MCV: 90.2 fL (ref 80.0–100.0)
MONOS PCT: 9 %
MPV: 10.8 fL (ref 7.5–12.5)
Monocytes Absolute: 1386 cells/uL — ABNORMAL HIGH (ref 200–950)
NEUTROS ABS: 11858 {cells}/uL — AB (ref 1500–7800)
NEUTROS PCT: 77 %
PLATELETS: 264 10*3/uL (ref 140–400)
RBC: 5.42 MIL/uL (ref 4.20–5.80)
RDW: 13.3 % (ref 11.0–15.0)
WBC: 15.4 10*3/uL — ABNORMAL HIGH (ref 4.0–10.5)

## 2016-07-21 NOTE — Patient Instructions (Signed)
Keep yourself hydrated. Make sure you're Primus Bravo chewed up very well or even possibly put in a blender. Rice and possible probably be easier to deal with.

## 2016-07-21 NOTE — Progress Notes (Signed)
   Subjective:    Patient ID: Justin [name], male    DOB: [birth year], 65 y.o.   MRN: [id number]  HPI He complains of a 1 month history of difficulty with swallowing especially solid foods. This is been getting worse within the last several weeks. He feels as if food is getting stuck at the same place. He also feels as if he has a lump in his throat in that area. He has not seen any blood, shortness of breath, fever, weight change. No black stools or change in bowel habits.   Review of Systems     Objective:   Physical Exam Alert and in no distress. Tympanic membranes and canals are normal. Pharyngeal area is normal. Neck is supple without adenopathy or thyromegaly. Cardiac exam shows a regular sinus rhythm without murmurs or gallops. Lungs are clear to auscultation. X-ray does no changes.       Assessment & Plan:  Pharyngoesophageal dysphagia - Plan: CBC with Differential/Platelet, Comprehensive metabolic panel, DG Chest 2 View Is scheduled to be seen by GI in one week. Cautioned to make sure he drinks fluids and eats foods that are easy to swallow specifically rice and soups. If he has difficulty with that, I did recommend that he go to the emergency room for further evaluation. The abrupt onset this is quite concerning.

## 2016-07-26 ENCOUNTER — Telehealth: Payer: Self-pay | Admitting: Family Medicine

## 2016-07-26 NOTE — Telephone Encounter (Signed)
Patient called and said you wanted to know how he was feeling and he said FINE.  Please cancel appt for tomorrow that you were going to make.  Pt ph [phone]

## 2016-07-26 NOTE — Telephone Encounter (Signed)
Called and cancelled appt for pt with Dr. Collene Mares. Justin [name]

## 2016-07-26 NOTE — Telephone Encounter (Signed)
Canceled the appointment with GI. I think it is with Dr Collene Mares

## 2016-08-06 ENCOUNTER — Other Ambulatory Visit: Payer: Self-pay | Admitting: Family Medicine

## 2016-08-08 ENCOUNTER — Telehealth: Payer: Self-pay | Admitting: Family Medicine

## 2016-08-08 NOTE — Telephone Encounter (Signed)
Pt called and states his throat is giving him trouble again and wants to see Dr. Collene Mares now.  He is at home all day on Wednesday.  Please call pt with appt [phone]  He cancelled the first one because he got better.

## 2016-08-11 NOTE — Telephone Encounter (Signed)
Faxed request to [facility]

## 2016-08-15 ENCOUNTER — Other Ambulatory Visit: Payer: Self-pay | Admitting: Gastroenterology

## 2016-08-17 ENCOUNTER — Encounter (HOSPITAL_COMMUNITY): Payer: Self-pay | Admitting: *Deleted

## 2016-08-17 NOTE — Progress Notes (Signed)
Pt denies SOB and chest pain. Pt made aware to not take diabetes medication (pill ) Januvia, the morning of procedure. Pt made aware of diabetes protocol to check blood glucose (BG) every 2 hours prior to arrival to hospital. Pt made aware to drink 4 ounces of Apple juice or Cranberry juice to treat a BG <70 and recheck BG 15 minutes after drinking juice. Pt made aware to phone number to Endo if BG is still < 70 after drinking juice or any other emergency the morning of procedure Pt stated that he does not take NSAID's and has not been taking Aspirin. Pt made aware to stop taking vitamins, fish oil and herbal medications. Pt verbalized understanding of all pre-op instructions.

## 2016-08-18 ENCOUNTER — Ambulatory Visit (HOSPITAL_COMMUNITY): Payer: BLUE CROSS/BLUE SHIELD | Admitting: Certified Registered"

## 2016-08-18 ENCOUNTER — Ambulatory Visit (HOSPITAL_COMMUNITY)
Admission: RE | Admit: 2016-08-18 | Discharge: 2016-08-18 | Disposition: A | Discharge status: Home / Self Care | Payer: BLUE CROSS/BLUE SHIELD | Source: Ambulatory Visit | Attending: Gastroenterology | Admitting: Gastroenterology

## 2016-08-18 ENCOUNTER — Encounter (HOSPITAL_COMMUNITY)
Admission: RE | Disposition: A | Discharge status: Home / Self Care | Payer: Self-pay | Source: Ambulatory Visit | Attending: Gastroenterology

## 2016-08-18 ENCOUNTER — Ambulatory Visit (HOSPITAL_COMMUNITY): Payer: BLUE CROSS/BLUE SHIELD

## 2016-08-18 ENCOUNTER — Encounter (HOSPITAL_COMMUNITY): Payer: Self-pay | Admitting: *Deleted

## 2016-08-18 DIAGNOSIS — C159 Malignant neoplasm of esophagus, unspecified: Secondary | ICD-10-CM

## 2016-08-18 DIAGNOSIS — K2270 Barrett's esophagus without dysplasia: Secondary | ICD-10-CM | POA: Diagnosis not present

## 2016-08-18 DIAGNOSIS — I10 Essential (primary) hypertension: Secondary | ICD-10-CM | POA: Diagnosis not present

## 2016-08-18 DIAGNOSIS — K449 Diaphragmatic hernia without obstruction or gangrene: Secondary | ICD-10-CM | POA: Insufficient documentation

## 2016-08-18 DIAGNOSIS — F17210 Nicotine dependence, cigarettes, uncomplicated: Secondary | ICD-10-CM | POA: Diagnosis not present

## 2016-08-18 DIAGNOSIS — E7800 Pure hypercholesterolemia, unspecified: Secondary | ICD-10-CM | POA: Insufficient documentation

## 2016-08-18 DIAGNOSIS — K2950 Unspecified chronic gastritis without bleeding: Secondary | ICD-10-CM | POA: Insufficient documentation

## 2016-08-18 DIAGNOSIS — K2980 Duodenitis without bleeding: Secondary | ICD-10-CM | POA: Diagnosis not present

## 2016-08-18 DIAGNOSIS — R1310 Dysphagia, unspecified: Secondary | ICD-10-CM | POA: Insufficient documentation

## 2016-08-18 DIAGNOSIS — E119 Type 2 diabetes mellitus without complications: Secondary | ICD-10-CM | POA: Insufficient documentation

## 2016-08-18 DIAGNOSIS — R634 Abnormal weight loss: Secondary | ICD-10-CM | POA: Insufficient documentation

## 2016-08-18 HISTORY — DX: Headache: R51

## 2016-08-18 HISTORY — PX: ESOPHAGOGASTRODUODENOSCOPY (EGD) WITH PROPOFOL: SHX5813

## 2016-08-18 HISTORY — DX: Personal history of urinary calculi: Z87.442

## 2016-08-18 HISTORY — DX: Pure hypercholesterolemia, unspecified: E78.00

## 2016-08-18 HISTORY — DX: Malignant (primary) neoplasm, unspecified: C80.1

## 2016-08-18 HISTORY — DX: Headache, unspecified: R51.9

## 2016-08-18 SURGERY — ESOPHAGOGASTRODUODENOSCOPY (EGD) WITH PROPOFOL
Anesthesia: Monitor Anesthesia Care

## 2016-08-18 MED ORDER — PROPOFOL 500 MG/50ML IV EMUL
INTRAVENOUS | Status: DC | PRN
  Administered 2016-08-18: 25 ug/kg/min via INTRAVENOUS

## 2016-08-18 MED ORDER — LIDOCAINE HCL (CARDIAC) 20 MG/ML IV SOLN
INTRAVENOUS | Status: DC | PRN
  Administered 2016-08-18: 50 mg via INTRAVENOUS

## 2016-08-18 MED ORDER — SODIUM CHLORIDE 0.9 % IV SOLN
INTRAVENOUS | Status: DC
  Administered 2016-08-18: 09:00:00 via INTRAVENOUS

## 2016-08-18 MED ORDER — IOPAMIDOL (ISOVUE-300) INJECTION 61%
INTRAVENOUS | Status: AC
  Administered 2016-08-18: 100 mL
  Filled 2016-08-18: qty 100

## 2016-08-18 NOTE — H&P (Signed)
  Justin [name] HPI: This is a 66 year old male with complaints of dysphagia to both solids and liquids starting December 2017.  He reports a weight loss of 20 lbs, but this has been over the past 6 years.  His last colonoscopy was on 11/04/2010 with findings of some adenomas.  Past Medical History:  Diagnosis Date  . Allergy   . Cataract   . Diabetes mellitus   . Headache   . History of kidney stones   . Hx of colonic polyps    pre-cancerous  . Hypercholesterolemia   . Hypertension     Past Surgical History:  Procedure Laterality Date  . COLONOSCOPY  2002,2006    Family History  Problem Relation Age of Onset  . Stomach cancer Father   . Breast cancer Sister   . Liver cancer Brother     Social History:  reports that he has been smoking Cigarettes.  He has a 45.00 pack-year smoking history. He has never used smokeless tobacco. He reports that he does not drink alcohol or use drugs.  Allergies: No Known Allergies  Medications:  Scheduled:  Continuous: . sodium chloride      No results found for this or any previous visit (from the past 24 hour(s)).   No results found.  ROS:  As stated above in the HPI otherwise negative.  Blood pressure 140/71, pulse 89, temperature 97.9 F (36.6 C), temperature source Oral, resp. rate 17, height 5\' 9"  (1.753 m), weight 91.2 kg (201 lb), SpO2 96 %.    PE: Gen: NAD, Alert and Oriented HEENT:  [place]/AT, EOMI Neck: Supple, no LAD Lungs: CTA Bilaterally CV: RRR without M/G/R ABM: Soft, NTND, +BS Ext: No C/C/E  Assessment/Plan: 1) Dysphagia to solids and liquids - EGD with dilation.  Erika Slaby D 08/18/2016, 8:12 AM

## 2016-08-18 NOTE — Op Note (Signed)
[facility] Patient Name: Justin [name] Procedure Date : 08/18/2016 MRN: [id number] Attending MD: Carol Ada , MD Date of Birth: 08-09-1950 CSN: [id number] Age: 66 Admit Type: Outpatient Procedure:                Upper GI endoscopy Indications:              Dysphagia, Weight loss Providers:                Carol Ada, MD, Burtis Junes, RN, Elspeth Cho                            Tech., Technician, Mardene Celeste "Trish" Durene Romans, CRNA Referring MD:              Medicines:                Propofol per Anesthesia Complications:            No immediate complications. Estimated Blood Loss:     Estimated blood loss was minimal. Procedure:                Pre-Anesthesia Assessment:                           - Prior to the procedure, a History and Physical                            was performed, and patient medications and                            allergies were reviewed. The patient's tolerance of                            previous anesthesia was also reviewed. The risks                            and benefits of the procedure and the sedation                            options and risks were discussed with the patient.                            All questions were answered, and informed consent                            was obtained. Prior Anticoagulants: The patient has                            taken no previous anticoagulant or antiplatelet                            agents. ASA Grade Assessment: II - A patient with                            mild systemic disease. After reviewing the risks  and benefits, the patient was deemed in                            satisfactory condition to undergo the procedure.                           - Sedation was administered by an anesthesia                            professional. Deep sedation was attained.                           After obtaining informed consent, the endoscope was   passed under direct vision. Throughout the                            procedure, the patient's blood pressure, pulse, and                            oxygen saturations were monitored continuously. The                            [id number]) scope was introduced through the                            mouth, and advanced to the second part of duodenum.                            The upper GI endoscopy was accomplished without                            difficulty. The patient tolerated the procedure                            well. Scope In: Scope Out: Findings:      A large, fungating and ulcerating mass with no bleeding and no stigmata       of recent bleeding was found in the distal esophagus. The mass was       partially obstructing and 1/3 circiumfirential proximally and       circumfirential distally. Biopsies were taken with a cold forceps for       histology.      A 2 cm hiatal hernia was present.      Diffuse moderate inflammation characterized by erosions and erythema was       found in the entire examined stomach. Biopsies were taken with a cold       forceps for histology.      Diffuse moderate inflammation characterized by erythema was found in the       duodenal bulb. 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 Biopsied.                           -  2 cm hiatal hernia.                           - Gastritis. Biopsied.                           - Duodenitis. Moderate Sedation:      None Recommendation:           - Patient has a contact number available for                            emergencies. The signs and symptoms of potential                            delayed complications were discussed with the                            patient. Return to normal activities tomorrow.                            Written discharge instructions were provided to the                             patient.                           - Resume previous diet.                           - Continue present medications.                           - Await pathology results.                           - Perform an upper endoscopic ultrasound (UEUS) at                            the next available appointment. Procedure Code(s):        --- Professional ---                           [phone], Esophagogastroduodenoscopy, flexible,                            transoral; with biopsy, single or multiple Diagnosis Code(s):        --- Professional ---                           C15.5, Malignant neoplasm of lower third of                            esophagus                           K44.9, Diaphragmatic hernia without obstruction or  gangrene                           K29.70, Gastritis, unspecified, without bleeding                           K29.80, Duodenitis without bleeding                           R13.10, Dysphagia, unspecified                           R63.4, Abnormal weight loss CPT copyright 2016 American Medical Association. All rights reserved. The codes documented in this report are preliminary and upon coder review may  be revised to meet current compliance requirements. Carol Ada, MD Carol Ada, MD 08/18/2016 9:10:30 AM This report has been signed electronically. Number of Addenda: 0

## 2016-08-18 NOTE — Transfer of Care (Signed)
Immediate Anesthesia Transfer of Care Note  Patient: Justin [name]  Procedure(s) Performed: Procedure(s): ESOPHAGOGASTRODUODENOSCOPY (EGD) WITH PROPOFOL (N/A) SAVORY DILATION (N/A)  Patient Location: PACU  Anesthesia Type:General  Level of Consciousness: sedated  Airway & Oxygen Therapy: Patient Spontanous Breathing and Patient connected to nasal cannula oxygen  Post-op Assessment: Report given to RN and Post -op Vital signs reviewed and stable  Post vital signs: Reviewed  Last Vitals: 104/55, 91, 16, 95% Vitals:   08/18/16 0746  BP: 140/71  Pulse: 89  Resp: 17  Temp: 36.6 C    Last Pain:  Vitals:   08/18/16 0746  TempSrc: Oral         Complications: No apparent anesthesia complications

## 2016-08-18 NOTE — Anesthesia Postprocedure Evaluation (Signed)
Anesthesia Post Note  Patient: Justin [name]  Procedure(s) Performed: Procedure(s) (LRB): ESOPHAGOGASTRODUODENOSCOPY (EGD) WITH PROPOFOL (N/A) SAVORY DILATION (N/A)  Patient location during evaluation: PACU Anesthesia Type: MAC Level of consciousness: awake and alert Pain management: pain level controlled Vital Signs Assessment: post-procedure vital signs reviewed and stable Respiratory status: spontaneous breathing, nonlabored ventilation, respiratory function stable and patient connected to nasal cannula oxygen Cardiovascular status: stable and blood pressure returned to baseline Anesthetic complications: no       Last Vitals:  Vitals:   08/18/16 0746 08/18/16 0900  BP: 140/71 (!) 104/55  Pulse: 89 83  Resp: 17 (!) 24  Temp: 36.6 C 36.5 C    Last Pain:  Vitals:   08/18/16 0900  TempSrc: Oral                 Elric Tirado DAVID

## 2016-08-18 NOTE — Anesthesia Preprocedure Evaluation (Signed)
Anesthesia Evaluation  Patient identified by MRN, date of birth, ID band Patient awake    Reviewed: Allergy & Precautions, NPO status , Patient's Chart, lab work & pertinent test results  Airway Mallampati: I  TM Distance: >3 FB Neck ROM: Full    Dental   Pulmonary Current Smoker,    Pulmonary exam normal        Cardiovascular hypertension, Pt. on medications Normal cardiovascular exam     Neuro/Psych    GI/Hepatic   Endo/Other  diabetes  Renal/GU      Musculoskeletal   Abdominal   Peds  Hematology   Anesthesia Other Findings   Reproductive/Obstetrics                             Anesthesia Physical Anesthesia Plan  ASA: III  Anesthesia Plan: MAC   Post-op Pain Management:    Induction: Intravenous  Airway Management Planned: Simple Face Mask  Additional Equipment:   Intra-op Plan:   Post-operative Plan:   Informed Consent: I have reviewed the patients History and Physical, chart, labs and discussed the procedure including the risks, benefits and alternatives for the proposed anesthesia with the patient or authorized representative who has indicated his/her understanding and acceptance.     Plan Discussed with: CRNA and Surgeon  Anesthesia Plan Comments:         Anesthesia Quick Evaluation

## 2016-08-18 NOTE — Transfer of Care (Signed)
Immediate Anesthesia Transfer of Care Note  Patient: Justin [name]  Procedure(s) Performed: Procedure(s): ESOPHAGOGASTRODUODENOSCOPY (EGD) WITH PROPOFOL (N/A) SAVORY DILATION (N/A)  Patient Location: PACU  Anesthesia Type:General  Level of Consciousness: sedated  Airway & Oxygen Therapy: Patient Spontanous Breathing and Patient connected to nasal cannula oxygen  Post-op Assessment: Report given to RN and Post -op Vital signs reviewed and stable  Post vital signs: Reviewed  Last Vitals: 96/64, 80, 22, 95% Vitals:   08/18/16 0746  BP: 140/71  Pulse: 89  Resp: 17  Temp: 36.6 C    Last Pain:  Vitals:   08/18/16 0746  TempSrc: Oral         Complications: No apparent anesthesia complications

## 2016-08-18 NOTE — Discharge Instructions (Signed)

## 2016-08-19 ENCOUNTER — Encounter (HOSPITAL_COMMUNITY): Payer: Self-pay | Admitting: Gastroenterology

## 2016-08-21 ENCOUNTER — Other Ambulatory Visit: Payer: Self-pay | Admitting: Gastroenterology

## 2016-08-22 ENCOUNTER — Telehealth: Payer: Self-pay | Admitting: *Deleted

## 2016-08-22 NOTE — Telephone Encounter (Signed)
Oncology Nurse Navigator Documentation  Oncology Nurse Navigator Flowsheets 08/22/2016  Navigator Location [facility]  Referral date to RadOnc/MedOnc 08/22/2016  Navigator Encounter Type Introductory phone call  Abnormal Finding Date 08/18/2016  Confirmed Diagnosis Date 08/18/2016  Spoke with stepdaughter, Lattie Haw and provided new patient appointment for 08/25/16 at 1015/1030 with Dr. Benay Spice in [facility] [place]. Informed of location of [place], valet service, and registration process. Reminded to bring photo ID,  insurance cards and a current medication list, including supplements. She  verbalizes understanding. Navigator requested she have patient call to confirm the appointment. He will also be seen by CSW, PT, dietician. Confirmed that he would prefer [place] to [facility] (all his physicians are in [place]).

## 2016-08-22 NOTE — Telephone Encounter (Signed)
Patient called back to confirm the appointment for 08/25/16 at 05/07/1029 in [facility] [place].

## 2016-08-25 ENCOUNTER — Ambulatory Visit: Payer: BLUE CROSS/BLUE SHIELD | Attending: Oncology | Admitting: Physical Therapy

## 2016-08-25 ENCOUNTER — Ambulatory Visit (HOSPITAL_BASED_OUTPATIENT_CLINIC_OR_DEPARTMENT_OTHER): Payer: BLUE CROSS/BLUE SHIELD | Admitting: Oncology

## 2016-08-25 ENCOUNTER — Encounter: Payer: Self-pay | Admitting: *Deleted

## 2016-08-25 ENCOUNTER — Telehealth: Payer: Self-pay | Admitting: Oncology

## 2016-08-25 ENCOUNTER — Encounter: Payer: Self-pay | Admitting: Physical Therapy

## 2016-08-25 ENCOUNTER — Ambulatory Visit: Payer: Medicare Other | Admitting: Nutrition

## 2016-08-25 VITALS — BP 144/85 | HR 86 | Temp 98.5°F | Resp 18 | Ht 69.0 in | Wt 195.3 lb

## 2016-08-25 DIAGNOSIS — C159 Malignant neoplasm of esophagus, unspecified: Secondary | ICD-10-CM

## 2016-08-25 DIAGNOSIS — C155 Malignant neoplasm of lower third of esophagus: Secondary | ICD-10-CM | POA: Diagnosis not present

## 2016-08-25 DIAGNOSIS — C787 Secondary malignant neoplasm of liver and intrahepatic bile duct: Secondary | ICD-10-CM

## 2016-08-25 DIAGNOSIS — C779 Secondary and unspecified malignant neoplasm of lymph node, unspecified: Secondary | ICD-10-CM

## 2016-08-25 DIAGNOSIS — E119 Type 2 diabetes mellitus without complications: Secondary | ICD-10-CM

## 2016-08-25 DIAGNOSIS — M6281 Muscle weakness (generalized): Secondary | ICD-10-CM

## 2016-08-25 DIAGNOSIS — J449 Chronic obstructive pulmonary disease, unspecified: Secondary | ICD-10-CM

## 2016-08-25 DIAGNOSIS — Z7189 Other specified counseling: Secondary | ICD-10-CM

## 2016-08-25 DIAGNOSIS — I10 Essential (primary) hypertension: Secondary | ICD-10-CM

## 2016-08-25 NOTE — Progress Notes (Signed)
[facility] Psychosocial Distress Screening Clinical Social Work  Clinical Social Work met with pt, his wife Ellenore and step daughter at [facility] to introduce self, explain Support Programs and review distress screening protocol.  The patient scored a 5 on the Psychosocial Distress Thermometer which indicates moderate distress. Pt is still working and just started drawing his social security benefits. He has signed up for medicare as well. His stepdaughter works in [employer] and has been a Microbiologist to them as they have navigated those changes. Pt does have STD through work and plans to explore those benefits before stopping work all together. 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 CSW reviewed in detail the role of CSW, Support Programs, and encouraged pt to attend [facility]. Pt and family deny other needs, but agree to reach out as needed.     ONCBCN DISTRESS SCREENING 08/25/2016  Screening Type Initial Screening  Distress experienced in past week (1-10) 5  Practical problem type Food  Emotional problem type Adjusting to illness  Information Concerns Type Lack of info about diagnosis  Physical Problem type Nausea/vomiting;Mouth sores/swallowing;Loss of appetitie  Physician notified of physical symptoms Yes  Referral to clinical social work Yes  Referral to dietition Yes  Referral to support programs Yes    Clinical Social Worker follow up needed: No.  If yes, follow up plan:  Loren Racer, [place]  [facility] Phone: [phone] Fax: [phone]

## 2016-08-25 NOTE — Telephone Encounter (Signed)
Appointments scheduled per 2/2 LOS. Patient given AVS report and calendars with future scheduled appointments. °

## 2016-08-25 NOTE — Progress Notes (Signed)
[place] [facility] Patient Consult   Referring MD: Theory Imig 66 y.o.  August 11, 1950    Reason for Referral: Esophagus cancer   HPI: [name] ports developing solid dysphagia in late December 2017. There was transient improvement and he canceled an initial appointment with Dr. Collene Mares. The symptoms returned and he was referred to Dr. Benson Norway. He was taken to an upper endoscopy 08/18/2016. A large fungating and ulcerating mass was noted in the distal esophagus. The mass was partially obstructing one third circumferential. Biopsies were obtained. Diffuse inflammation was noted in the stomach. Biopsies of the stomach were taken. The pathology [id number]) revealed chronic active gastritis with H. pylori. 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  He reports tolerating liquids. He has persistent solid dysphagia.  He was referred for CTs of the chest, abdomen, and pelvis on 08/18/2016. Mild mediastinal and left hilar lymphadenopathy was noted. Subtle distal esophagus soft tissue fullness with minimal dilation of the proximal esophagus. Moderate emphysema. Bilateral nonspecific pulmonary nodules. Compared to a CT of the abdomen/pelvis from March 2012 there are new liver lesions consistent with metastatic disease. Gastrohepatic ligament nodes are upper normal at 9 mm. No other abdominal/retroperitoneal adenopathy. No evidence of omental or peritoneal disease.    Past Medical History:  Diagnosis Date  . Allergy   . Cancer (HCC)-Esophagus, stage IV  January 2018   . Cataract   . Diabetes mellitus   .    Marland Kitchen History of kidney stones   . Hx of colonic polyps    pre-cancerous  . Hypercholesterolemia   . Hypertension     Past Surgical History:  Procedure Laterality Date  . COLONOSCOPY  [id number]  . ESOPHAGOGASTRODUODENOSCOPY (EGD) WITH PROPOFOL N/A 08/18/2016   Procedure: ESOPHAGOGASTRODUODENOSCOPY (EGD) WITH  PROPOFOL;  Surgeon: Carol Ada, MD;  Location: [place];  Service: Endoscopy;  Laterality: N/A;    .  Left third finger surgery after trauma 35 years ago  Medications: Reviewed  Allergies: No Known Allergies  Family history: His sister had breast cancer. His brother had lung cancer and was a smoker. He had 2 brothers and one sister.  Social History:   He lives in [place]. He has worked as a Development worker, community for 45 years. He has smoked one pack of cigarette per day for 50 years. Rare alcohol use. No transfusion history. No risk factor for HIV or hepatitis.  History  Alcohol Use No    History  Smoking Status  . Current Every Day Smoker  . Packs/day: 1.00  . Years: 45.00  . Types: Cigarettes  Smokeless Tobacco  . Never Used      ROS:   Positives include:Anorexia, 15 pound weight loss, solid dysphagia, exertional dyspnea  A complete ROS was otherwise negative.  Physical Exam:  Blood pressure (!) 144/85, pulse 86, temperature 98.5 F (36.9 C), temperature source Oral, resp. rate 18, height 5\' 9"  (1.753 m), weight 195 lb 4.8 oz (88.6 kg), SpO2 100 %.  HEENT: Upper denture plate, oropharynx without visible mass, neck without mass Lungs: Scattered bilateral in expiratory wheeze, no respiratory distress Cardiac: Regular rate and rhythm Abdomen: No hepatosplenomegaly, no mass, nontender GU: Testes without mass  Vascular: No leg edema Lymph nodes: No cervical, supraclavicular, axillary, or inguinal nodes Neurologic: Alert and oriented, the motor exam appears intact in the upper and lower extremities Skin: Multiple inclusion cyst over the trunk, multiple scars at sites of cutaneous abscesses  Musculoskeletal: No spine tenderness   LAB:  CBC  Lab Results  Component Value Date   WBC 15.4 (H) 07/21/2016   HGB 16.6 07/21/2016   HCT 48.9 07/21/2016   MCV 90.2 07/21/2016   PLT 264 07/21/2016   NEUTROABS 11,858 (H) 07/21/2016     CMP      Component  Value Date/Time   NA 135 07/21/2016 1301   K 3.8 07/21/2016 1301   CL 105 07/21/2016 1301   CO2 19 (L) 07/21/2016 1301   GLUCOSE 107 (H) 07/21/2016 1301   BUN 13 07/21/2016 1301   CREATININE 0.79 07/21/2016 1301   CALCIUM 9.6 07/21/2016 1301   PROT 7.4 07/21/2016 1301   ALBUMIN 4.4 07/21/2016 1301   AST 14 07/21/2016 1301   ALT 14 07/21/2016 1301   ALKPHOS 90 07/21/2016 1301   BILITOT 0.5 07/21/2016 1301      Imaging: I reviewed the CT images from 08/18/2016 with Justin [name]  Assessment/Plan:   1. Adenocarcinoma of the distal esophagus-EGD 08/18/2016 confirmed a distal esophagus mass  Staging CTs 08/18/2016 with nonspecific pulmonary nodules, liver metastases, borderline thoracic and gastrohepatic ligament lymphadenopathy 2. Diabetes  3.   COPD  4.  Hypertension  5.  History of colon polyps   Disposition:   Justin [name] has been diagnosed with adenocarcinoma of the distal esophagus. Restaging CT evaluations consistent with metastatic disease involving the liver and lymph nodes. The small pulmonary nodules are suspicious for metastatic disease.  I discussed the prognosis and treatment options with Justin [name] and his family. He understands no therapy will be curative. I recommend systemic chemotherapy with the FOLFOX regimen. We will refer him for palliative radiation if the dysphagia does not improve following chemotherapy.  I reviewed the potential toxicities associated with the FOLFOX regimen including the chance for nausea/vomiting, mucositis, diarrhea, alopecia, and hematologic toxicity. We discussed the rash, hyperpigmentation, sun sensitivity, and hand/foot syndrome associated with 5 fluorouracil. We reviewed the allergic reaction and various types of neuropathy associated with oxaliplatin. He agrees to proceed.  Justin [name] will be referred for placement of a Port-A-Cath prior to a first cycle of FOLFOX on 09/04/2016.  He was evaluated by the [facility] nutritionist  today. He will contact us if he is unable to tolerate liquids.  60 minutes were spent with the patient today. The majority of the time was used for counseling and coordination of care.  Betsy Coder, MD  08/25/2016, 10:54 AM

## 2016-08-25 NOTE — Patient Instructions (Signed)
Tandem Stance    Can hold onto counter if not feeling steady. Right foot in front of left, heel touching toe both feet "straight ahead". Stand on Foot Triangle of Support with both feet. Balance in this position _30__ seconds. Do with left foot in front of right. 3 times each  Copyright  VHI. All rights reserved.    Stance: single leg on floor. Raise leg. Hold _15__ seconds. Repeat with other leg. __3_ reps per set, _1__ sets per day .  Can hold onto counter to keep steady  Copyright  VHI. All rights reserved.    Ways to get started on an exercise program 1.  Start for 10 minutes per day with a walking program. 2. Work towards 30 minutes of exercise per day 3. When you do an aerobic exercise program start on a low level 4. Water aerobics is a good place due to decreased strain on your joints 5. Begin your exercise program gradually and progress slowly over time 6. When exercising use correct form. a. Keep neutral spine b. Engage abdominals c. Keep chest up  d. Chin down e. Do not lock your knees  Justin [name], PT [facility] at [facility], [place], [place] [address] [phone]    Tips for Energy Conservation for Activities of Daily Living . Plan ahead to avoid rushing. . Sit down to bathe and dry off. Wear a terry robe instead of drying off. . Use a shower/bath organizer to decrease leaning and reaching. . Use extension handles on sponges and brushes. Susa Simmonds grab rails in the bathroom or use an elevated toilet seat. Hoyle Barr out clothes and toiletries before dressing. . Minimize leaning over to put on clothes and shoes. Bring your foot to your knee to apply socks and shoes. . Wear comfortable shoes and low-heeled, slip on shoes. Wear button front shirts rather than pullovers. Housekeeping . Schedule household tasks throughout the week. . Do housework sitting down when possible. . Delegate heavy housework, shopping, laundry and  child care when possible. . Drag or slide objects rather than lifting. . Sit when ironing and take rest periods. . Stop working before becoming overly tired. Shopping . Organize list by aisle. . Use a grocery cart for support. Marland Kitchen Shop at less busy times. . Ask for help with getting to the car. Meal Preparation . Use convenience and easy-to-prepare foods. . Use small appliances that take less effort to use. Marland Kitchen Prepare meals sitting down. . Soak dishes instead of scrubbing and let dishes air dry. . Prepare double portions and freeze half. Child Care . Plan activities that can be done sitting down, such as drawing pictures, playing games, reading, and computer games. . Encourage children to climb up onto your lap or into the highchair instead of being lifted. . Make a game of the household chores so that children will want to help. . Delegate child care when possible.  Justin [name], PT, [facility] [place]; [address] [place], [place], [place] [address]

## 2016-08-25 NOTE — Progress Notes (Signed)
Oncology Nurse Navigator Documentation  Oncology Nurse Navigator Flowsheets 08/25/2016  Navigator Location [facility]  Referral date to RadOnc/MedOnc -  Navigator Encounter Type Clinic/MDC  Abnormal Finding Date -  Confirmed Diagnosis Date -  Multidisiplinary Clinic Date 08/25/2016  Patient Visit Type MedOnc  Treatment Phase Pre-Tx/Tx Discussion  Barriers/Navigation Needs Education;Coordination of Care  Education Newly Diagnosed Cancer Education;Preparing for Upcoming Surgery/ Treatment;Understanding Cancer/ Treatment Options  Interventions Coordination of Care  Coordination of Care Radiology--let voice mail for IR requesting 1st available port appointment  Support Groups/Services [place] Manager  Acuity Level 2  Time Spent with Patient 37  Met with patient and wife during new patient visit. Provided New Patient Packet with information on: 1. Esophageal cancer--info on PAC, CEA level 2. Support groups 3. Fall Safety Plan Answered questions, reviewed current treatment plan using TEACH back and provided emotional support. Provided copy of current treatment plan. Escorted to scheduling department to make appointments. Email to [facility] pathology for Her-2 testing on his pathology case:  Patient: Justin [name], Justin [name] Collected: 08/18/2016 Client: [facility] Accession: [id number] Received: 08/18/2016 Carol Ada DOB: [birth year] Age: 66 Gender: M Reported: 08/21/2016 [address] Patient Ph: [phone] MRN #: [id number] [place], [place] [address] Visit #: [id number] Chart #: Phone: [phone] Fax: REPORT  Merceda Elks, RN, BSN GI Oncology [place]

## 2016-08-25 NOTE — Progress Notes (Signed)
START ON PATHWAY REGIMEN - Gastroesophageal  GEOS3: mFOLFOX6 q14 Days Until Progression or Unacceptable Toxicity   A cycle is every 14 days:     Oxaliplatin (Eloxatin(R)) 85 mg/m2 in 250 mL D5W IV over 2 hours day 1, q14 days Dose Mod: None     Leucovorin 400 mg/m2 in 250 mL D5W IV over 2 hours day 1, q14 days, followed immediately by Dose Mod: None     5-Fluorouracil 400 mg/m2 IV bolus over 2-4 minutes day 1, q14 days Dose Mod: None     5-Fluorouracil 2,400 mg/m2 in _____mL NS CIV as a 46 hour infusion starting on day 1, q14 days Dose Mod: None Additional Orders: **Note: order sheet contains two q14 day cycles**  **Always confirm dose/schedule in your pharmacy ordering system**    Patient Characteristics: Distant Metastases (cM1/pM1) / Locally Recurrent Disease, Adenocarcinoma - Esophageal, GE Junction, and Gastric, First Line, HER2 Negative / Unknown Histology: Adenocarcinoma Disease Classification: Esophageal Therapeutic Status: Distant Metastases (No Additional Staging) Line of therapy: First Line Would you be surprised if this patient died  in the next year? I would NOT be surprised if this patient died in the next year HER2 Status: Awaiting Test Results  Intent of Therapy: Non-Curative / Palliative Intent, Discussed with Patient 

## 2016-08-25 NOTE — Patient Instructions (Signed)
Care Plan Summary- 08/25/2016 Name:  Justin [name]      DOB: [birth year] Your Medical Team: Medical Oncologist: Dr. Ma Rings Radiation Oncologist:   Surgeon:   Type of Cancer: Adenocarcinoma of Esophagus (distal) Stage/Grade: Stage IV *Exact staging of your cancer is based on size of the tumor, depth of invasion, involvement of lymph nodes or not, and whether or not the cancer has spread beyond the primary site   Recommendations: Based on information available as of today's consult. Recommendations may change depending on the results of further tests or exams. 1) Chemotherapy with FOLFOX (Oxaliplatin, Fluorouracil and Leucovorin) every 2 weeks-hope to start on 09/04/16 2) Baseline labs on day of chemo class 3) Will have biopsy tissue tested for Her-2 (potential for another drug ) Next Steps: 1) Schedule chemo class and labs 2) Interventional radiology will call with the port placement appointment 3)

## 2016-08-25 NOTE — Therapy (Signed)
[facility] [address] [place], [place], [address] Phone: [phone]   Fax:  [phone]  Physical Therapy Evaluation  Patient Details  Name: Justin [name] MRN: [id number] Date of Birth: 08/21/1950 Referring Provider: Dr. Benay Spice  Encounter Date: 08/25/2016      PT End of Session - 08/25/16 1216    Visit Number 1   PT Start Time [id number]   PT Stop Time 1210   PT Time Calculation (min) 17 min   Activity Tolerance Patient tolerated treatment well   Behavior During Therapy [facility] for tasks assessed/performed      Past Medical History:  Diagnosis Date  . Allergy   . Cancer ([place])   . Cataract   . Diabetes mellitus   . Headache   . History of kidney stones   . Hx of colonic polyps    pre-cancerous  . Hypercholesterolemia   . Hypertension     Past Surgical History:  Procedure Laterality Date  . COLONOSCOPY  [id number]  . ESOPHAGOGASTRODUODENOSCOPY (EGD) WITH PROPOFOL N/A 08/18/2016   Procedure: ESOPHAGOGASTRODUODENOSCOPY (EGD) WITH PROPOFOL;  Surgeon: Carol Ada, MD;  Location: [facility] [place];  Service: Endoscopy;  Laterality: N/A;    There were no vitals filed for this visit.       Subjective Assessment - 08/25/16 1211    Subjective Patient attended GI clinic to meet with cancer team   Patient is accompained by: Family member  step-daughter and wife   Pertinent History Diabetes; Hypertension; Distal esophageal cancer metatstaic   Patient Stated Goals education   Currently in Pain? No/denies   Multiple Pain Sites No            [facility] PT Assessment - 08/25/16 0001      Assessment   Medical Diagnosis esophageal cancer (metastatic)    Referring Provider Dr. Benay Spice   Onset Date/Surgical Date 08/18/16   Prior Therapy None     Precautions   Precautions Other (comment)   Precaution Comments Cancer     Restrictions   Weight Bearing Restrictions No     Balance Screen   Has the patient fallen in  the past 6 months No   Has the patient had a decrease in activity level because of a fear of falling?  No   Is the patient reluctant to leave their home because of a fear of falling?  No     Home Ecologist residence     Prior Function   Level of Independence Independent   Vocation Full time employment   Vocation Requirements going up steps to climb heavy equipment     Cognition   Overall Cognitive Status Within Functional Limits for tasks assessed     Observation/Other Assessments   Focus on Therapeutic Outcomes (FOTO)  10% limitation due to smoking, diabetes     Posture/Postural Control   Posture/Postural Control No significant limitations     ROM / Strength   AROM / PROM / Strength AROM;Strength     AROM   Overall AROM  Within functional limits for tasks performed     Strength   Overall Strength Comments bilateral hip adduction 4/5; bilateral knee extension 4/5     Transfers   Transfers Not assessed     Ambulation/Gait   Ambulation/Gait No                           PT Education - 08/25/16 1215  Education provided Yes   Education Details tips to conserve energy; balance exercises; walking program   Person(s) Educated Patient;Spouse;Child(ren)   Methods Explanation;Demonstration;Verbal cues;Handout   Comprehension Returned demonstration;Verbalized understanding             PT Long Term Goals - 08/25/16 1221      PT LONG TERM GOAL #1   Title understand how to participate in a walking program and balance exercises to keep up his strength with treatment   Time 1   Period Days     PT LONG TERM GOAL #2   Title how to conserve energy with treatment   Time 1   Period Days   Status Achieved               Plan - 08/25/16 1215/11/16    Clinical Impression Statement Patient is a 66 year old male with diagnosis of esophageal cancer per eno on 08/18/2016.  Patient reports no pain.  Bilateral hip adduction is  4/5 and knee extension is 4/5. 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 Patient will be receiving chemotherapy for treatment.  Patient is low complexity due to stable condition and diabetes and cancer as comorbidites that may affect care.    Rehab Potential Good   Clinical Impairments Affecting Rehab Potential Diabetes; hypertensionn; esophageal cancer metastaic   PT Frequency One time visit   PT Duration Other (comment)  1 time visit in GI clinic   PT Treatment/Interventions Therapeutic activities;Therapeutic exercise;Balance training;Neuromuscular re-education;Patient/family education   PT Next Visit Plan Discharge to HEP   PT Home Exercise Plan Current HEP   Recommended Other Services None   Consulted and Agree with Plan of Care Patient;Family member/caregiver   Family Member Consulted wife and step-daughter      Patient will benefit from skilled therapeutic intervention in order to improve the following deficits and impairments:  Decreased strength  Visit Diagnosis: Muscle weakness (generalized) - Plan: PT plan of care cert/re-cert      G-Codes - [id number] 11-15-20    Functional Assessment Tool Used Therapist discretion is 10% limitation due to diabetes and cancer   Functional Limitation Other PT primary   Other PT Primary Current Status [id number]) At least 1 percent but less than 20 percent impaired, limited or restricted   Other PT Primary Goal Status [id number]) At least 1 percent but less than 20 percent impaired, limited or restricted   Other PT Primary Discharge Status [id number]) At least 1 percent but less than 20 percent impaired, limited or restricted       Problem List Patient Active Problem List   Diagnosis Date Noted  . 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 History of renal stone 01/28/2016  . RLS (restless legs syndrome) 09/30/2015  . Arthritis 12/20/2012  . Current  smoker 12/20/2012  . Type 2 diabetes with complication ([place]) [id number]  . Hypertension associated with diabetes ([place]) 12/02/2010  . Hyperlipidemia associated with type 2 diabetes mellitus ([place]) 12/02/2010  . Allergic rhinitis 12/02/2010  . Diverticulosis of colon 12/02/2010  . BPH (benign prostatic hyperplasia) 12/02/2010  . Hx of colonic polyps 12/02/2010    Earlie Counts, PT 08/25/16 12:24 PM   [facility] [address] [place], [place], [address] Phone: [phone]   Fax:  [phone]  Name: MAMADI FERRARIS MRN: [id number] Date of Birth: 07-19-1951

## 2016-08-25 NOTE — Progress Notes (Signed)
Patient was seen in GI clinic.  66 year old male diagnosed with metastatic adeno esophageal cancer.  Past medical history includes hypertension, hypercholesterolemia, kidney stones and diabetes.  Medications include Januvia, and Zocor.  Labs include glucose 107 on December 29.  Height: 5 feet 9 inches. Weight: 195.3 pounds. Usual body weight: 220 pounds. BMI: 28.84.  Patient reports that he is having dysphasia of both solids and liquids. He has much difficulty swallowing Bread. He is drinking one oral nutrition supplement the day.  Nutrition diagnosis: Inadequate oral intake related to esophageal cancer as evidenced by 9% weight loss over 11 months.  Intervention: Educated patient to consume high-calorie, high-protein foods in small frequent meals daily. Recommended increase oral nutrition supplements twice a day between meals. Provided samples and coupons. I educated patient on ways to make swallowing foods and liquids easier. Reviewed high protein foods. Fact sheets were provided.  Questions were answered.  Teach back method used.  Contact information given.  Monitoring, evaluation, goals:  Patient will tolerate adequate calories and protein to promote weight maintenance throughout treatment.  Next visit: To be scheduled chemotherapy.  **Disclaimer: This note was dictated with voice recognition software. Similar sounding words can inadvertently be transcribed and this note may contain transcription errors which may not have been corrected upon publication of note.**

## 2016-08-29 ENCOUNTER — Other Ambulatory Visit: Payer: Self-pay | Admitting: *Deleted

## 2016-08-29 ENCOUNTER — Other Ambulatory Visit: Payer: Self-pay | Admitting: Radiology

## 2016-08-29 MED ORDER — PROCHLORPERAZINE MALEATE 10 MG PO TABS: 10.0000 mg | ORAL_TABLET | Freq: Four times a day (QID) | ORAL | 0 refills | Status: DC | PRN

## 2016-08-29 MED ORDER — LIDOCAINE-PRILOCAINE 2.5-2.5 % EX CREA: 1.0000 "application " | TOPICAL_CREAM | CUTANEOUS | 0 refills | Status: DC | PRN

## 2016-08-30 ENCOUNTER — Other Ambulatory Visit: Payer: Self-pay | Admitting: Oncology

## 2016-08-30 ENCOUNTER — Ambulatory Visit (HOSPITAL_COMMUNITY)
Admission: RE | Admit: 2016-08-30 | Discharge: 2016-08-30 | Disposition: A | Discharge status: Home / Self Care | Payer: BLUE CROSS/BLUE SHIELD | Source: Ambulatory Visit | Attending: Oncology | Admitting: Oncology

## 2016-08-30 ENCOUNTER — Encounter (HOSPITAL_COMMUNITY): Payer: Self-pay

## 2016-08-30 ENCOUNTER — Encounter: Payer: Self-pay | Admitting: Pharmacist

## 2016-08-30 DIAGNOSIS — E7800 Pure hypercholesterolemia, unspecified: Secondary | ICD-10-CM | POA: Insufficient documentation

## 2016-08-30 DIAGNOSIS — Z7982 Long term (current) use of aspirin: Secondary | ICD-10-CM | POA: Diagnosis not present

## 2016-08-30 DIAGNOSIS — C159 Malignant neoplasm of esophagus, unspecified: Secondary | ICD-10-CM

## 2016-08-30 DIAGNOSIS — E119 Type 2 diabetes mellitus without complications: Secondary | ICD-10-CM | POA: Diagnosis not present

## 2016-08-30 DIAGNOSIS — I10 Essential (primary) hypertension: Secondary | ICD-10-CM | POA: Insufficient documentation

## 2016-08-30 DIAGNOSIS — F17210 Nicotine dependence, cigarettes, uncomplicated: Secondary | ICD-10-CM | POA: Diagnosis not present

## 2016-08-30 HISTORY — PX: IR GENERIC HISTORICAL: IMG1180011

## 2016-08-30 LAB — CBC WITH DIFFERENTIAL/PLATELET
BASOS ABS: 0 10*3/uL (ref 0.0–0.1)
BASOS PCT: 0 %
EOS ABS: 0.1 10*3/uL (ref 0.0–0.7)
Eosinophils Relative: 1 %
HCT: 49 % (ref 39.0–52.0)
Hemoglobin: 17.1 g/dL — ABNORMAL HIGH (ref 13.0–17.0)
LYMPHS PCT: 24 %
Lymphs Abs: 3.7 10*3/uL (ref 0.7–4.0)
MCH: 30.8 pg (ref 26.0–34.0)
MCHC: 34.9 g/dL (ref 30.0–36.0)
MCV: 88.3 fL (ref 78.0–100.0)
MONO ABS: 1.1 10*3/uL — AB (ref 0.1–1.0)
Monocytes Relative: 7 %
Neutro Abs: 10.6 10*3/uL — ABNORMAL HIGH (ref 1.7–7.7)
Neutrophils Relative %: 68 %
PLATELETS: 336 10*3/uL (ref 150–400)
RBC: 5.55 MIL/uL (ref 4.22–5.81)
RDW: 13.7 % (ref 11.5–15.5)
WBC: 15.5 10*3/uL — AB (ref 4.0–10.5)

## 2016-08-30 LAB — BASIC METABOLIC PANEL
Anion gap: 12 (ref 5–15)
BUN: 11 mg/dL (ref 6–20)
CALCIUM: 9.5 mg/dL (ref 8.9–10.3)
CO2: 22 mmol/L (ref 22–32)
CREATININE: 0.92 mg/dL (ref 0.61–1.24)
Chloride: 104 mmol/L (ref 101–111)
GFR calc Af Amer: >60 mL/min (ref >60)
Glucose, Bld: 108 mg/dL — ABNORMAL HIGH (ref 65–99)
POTASSIUM: 3.5 mmol/L (ref 3.5–5.1)
SODIUM: 138 mmol/L (ref 135–145)

## 2016-08-30 LAB — PROTIME-INR
INR: 1
PROTHROMBIN TIME: 13.2 s (ref 11.4–15.2)

## 2016-08-30 LAB — GLUCOSE, CAPILLARY: Glucose-Capillary: 94 mg/dL (ref 65–99)

## 2016-08-30 MED ORDER — MIDAZOLAM HCL 2 MG/2ML IJ SOLN
INTRAMUSCULAR | Status: AC | PRN
  Administered 2016-08-30 (×2): 1 mg via INTRAVENOUS

## 2016-08-30 MED ORDER — MIDAZOLAM HCL 2 MG/2ML IJ SOLN
INTRAMUSCULAR | Status: AC
  Filled 2016-08-30: qty 6

## 2016-08-30 MED ORDER — LIDOCAINE HCL 1 % IJ SOLN
INTRAMUSCULAR | Status: AC | PRN
  Administered 2016-08-30: 10 mL

## 2016-08-30 MED ORDER — CEFAZOLIN SODIUM-DEXTROSE 2-4 GM/100ML-% IV SOLN
2.0000 g | INTRAVENOUS | Status: AC
  Administered 2016-08-30: 2 g via INTRAVENOUS
  Filled 2016-08-30: qty 100

## 2016-08-30 MED ORDER — FENTANYL CITRATE (PF) 100 MCG/2ML IJ SOLN
INTRAMUSCULAR | Status: AC | PRN
  Administered 2016-08-30: 50 ug via INTRAVENOUS

## 2016-08-30 MED ORDER — SODIUM CHLORIDE 0.9 % IV SOLN
INTRAVENOUS | Status: DC
  Administered 2016-08-30: 12:00:00 via INTRAVENOUS

## 2016-08-30 MED ORDER — HEPARIN SOD (PORK) LOCK FLUSH 100 UNIT/ML IV SOLN
INTRAVENOUS | Status: AC
  Filled 2016-08-30: qty 5

## 2016-08-30 MED ORDER — FENTANYL CITRATE (PF) 100 MCG/2ML IJ SOLN
INTRAMUSCULAR | Status: AC
  Filled 2016-08-30: qty 4

## 2016-08-30 MED ORDER — LIDOCAINE HCL 1 % IJ SOLN
INTRAMUSCULAR | Status: AC
  Filled 2016-08-30: qty 20

## 2016-08-30 NOTE — Consult Note (Signed)
Chief Complaint: Patient was seen in consultation today for Port-A-Cath placement   Referring Physician(s): Sherrill,Gary B  Supervising Physician: Corrie Mckusick  Patient Status: [facility] - Out-pt  History of Present Illness: Justin [name] is a 66 y.o. male with history of recently diagnosed esophageal cancer and imaging findings suggestive of metastatic disease who presents today for Port-A-Cath placement for planned chemotherapy.  Past Medical History:  Diagnosis Date  . Allergy   . Cancer ([place])   . Cataract   . Diabetes mellitus   . Headache   . History of kidney stones   . Hx of colonic polyps    pre-cancerous  . Hypercholesterolemia   . Hypertension     Past Surgical History:  Procedure Laterality Date  . COLONOSCOPY  [id number]  . ESOPHAGOGASTRODUODENOSCOPY (EGD) WITH PROPOFOL N/A 08/18/2016   Procedure: ESOPHAGOGASTRODUODENOSCOPY (EGD) WITH PROPOFOL;  Surgeon: Carol Ada, MD;  Location: [facility];  Service: Endoscopy;  Laterality: N/A;    Allergies: Patient has no known allergies.  Medications: Prior to Admission medications   Medication Sig Start Date End Date Taking? Authorizing Provider  finasteride (PROSCAR) 5 MG tablet Take 1 tablet (5 mg total) by mouth daily. 01/28/16  Yes Denita Lung, MD  glucose blood test strip PT IS TO TEST ONE TO TWO TIMES A DAY 01/28/16  Yes Denita Lung, MD  ibuprofen (ADVIL,MOTRIN) 100 MG chewable tablet Chew 1 tablet (100 mg total) by mouth every 8 (eight) hours as needed. Reported on 09/30/2015 Patient taking differently: Chew 200 mg by mouth every 8 (eight) hours as needed. Reported on 09/30/2015 01/28/16  Yes Denita Lung, MD  JANUVIA 100 MG tablet TAKE 1 TABLET DAILY 08/07/16  Yes Denita Lung, MD  Lancets (FREESTYLE) lancets PT IS TO TEST ONE TO TWO TIMES A DAY 01/28/16  Yes Denita Lung, MD  lisinopril-hydrochlorothiazide (PRINZIDE,ZESTORETIC) 10-12.5 MG tablet Take 1 tablet by mouth daily. 01/28/16  Yes Denita Lung, MD    simvastatin (ZOCOR) 10 MG tablet Take 1 tablet (10 mg total) by mouth at bedtime. Patient taking differently: Take 10 mg by mouth daily.  01/28/16  Yes Denita Lung, MD  aspirin 81 MG tablet Take 81 mg by mouth daily.      Historical Provider, MD  lidocaine-prilocaine (EMLA) cream Apply 1 application topically as needed. Apply to port site and cover with plastic wrap one hour before port access 08/29/16   Ladell Pier, MD  prochlorperazine (COMPAZINE) 10 MG tablet Take 1 tablet (10 mg total) by mouth every 6 (six) hours as needed for nausea or vomiting. 08/29/16   Ladell Pier, MD     Family History  Problem Relation Age of Onset  . Stomach cancer Father   . Breast cancer Sister   . Liver cancer Brother     Social History   Social History  . Marital status: Married    Spouse name: N/A  . Number of children: N/A  . Years of education: N/A   Social History Main Topics  . Smoking status: Current Every Day Smoker    Packs/day: 1.00    Years: 50.00    Types: Cigarettes  . Smokeless tobacco: Never Used  . Alcohol use No  . Drug use: No  . Sexual activity: Yes   Other Topics Concern  . None   Social History Narrative  . None     Review of Systems currently denies fever, headache, chest pain, dyspnea, abdominal/back pain, nausea,  vomiting or abnormal bleeding. He continues to smoke and does have occasional cough. He has had weight loss.  Vital Signs: BP (!) 143/93 (BP Location: Left Arm)   Pulse 84   Temp 97.3 F (36.3 C) (Oral)   Resp 20   Ht 5\' 10"  (1.778 m)   Wt 195 lb (88.5 kg)   SpO2 98%   BMI 27.98 kg/m   Physical Exam awake, alert. Chest clear to auscultation bilaterally. Heart with regular rate and rhythm. Abdomen soft, positive bowel sounds, nontender. Lower extremities -no edema.  Mallampati Score:     Imaging: Ct Chest W Contrast  Result Date: 08/18/2016 CLINICAL DATA:  Esophageal cancer. EXAM: CT CHEST, ABDOMEN, AND PELVIS WITH CONTRAST  TECHNIQUE: Multidetector CT imaging of the chest, abdomen and pelvis was performed following the standard protocol during bolus administration of intravenous contrast. CONTRAST:  137mL ISOVUE-300 IOPAMIDOL (ISOVUE-300) INJECTION 61% COMPARISON:  09/28/2010 abdominopelvic CT. Chest radiograph 07/21/2016. Today's endoscopy report which demonstrates a large distal esophageal mass. FINDINGS: CT CHEST FINDINGS Cardiovascular: Aortic and branch vessel atherosclerosis. Normal heart size, without pericardial effusion. Multivessel coronary artery atherosclerosis. No central pulmonary embolism, on this non-dedicated study. Mediastinum/Nodes: No supraclavicular adenopathy. Borderline right paratracheal adenopathy at 10 mm on image 26/series 2. A left mediastinal node is positioned immediately anterior to the esophagus on image 35/ series 2 and measures 11 mm. Left infrahilar node is borderline enlarged at 10 mm on image 32/series 2. Tiny hiatal hernia. Subtle distal esophageal soft tissue fullness with suggestion of hyper enhancement including on image 53/series 2. The more proximal esophagus is minimally dilated with fluid level within. Lungs/Pleura: No pleural fluid. Lower lobe predominant bronchial wall thickening. Probable secretions in lateral left lower lobe segmental bronchus on image 99/series 3. Moderate centrilobular emphysema. Bilateral small nonspecific pulmonary nodules. Examples at 4 mm in the right upper lobe on image 54/ series 3 and 3 mm in the left upper lobe on image 57/series 3. There is a posterior left upper lobe calcified granuloma. Minimal paraseptal emphysema. Musculoskeletal: No acute osseous abnormality. CT ABDOMEN PELVIS FINDINGS Hepatobiliary: New liver lesions are consistent with metastatic disease. High left hepatic lobe 12 mm lesion on image 52/series 2 with subtle peripheral enhancement. High right hepatic lobe lesion also measures 12 mm on image 53/series 2. Pericholecystic liver lesion  measures 13 mm on image 69/ series 2. Underlying hepatic steatosis. Normal gallbladder, without biliary ductal dilatation. Pancreas: Normal, without mass or ductal dilatation. Spleen: Normal in size, without focal abnormality. Adrenals/Urinary Tract: Normal adrenal glands. Normal kidneys, without hydronephrosis. Median lobe impression into the urinary bladder is slightly worse on the left. Mild bladder wall thickening is likely secondary. Stomach/Bowel: Normal remainder of the stomach. Scattered colonic diverticula. Normal terminal ileum and appendix. Normal small bowel. Vascular/Lymphatic: Advanced aortic and branch vessel atherosclerosis. Gastrohepatic ligament nodes are upper normal at 9 mm, including on image 58/series 2. Otherwise, no abdominal retroperitoneal adenopathy. There are mildly prominent but likely reactive ileocolic mesenteric nodes. No pelvic sidewall adenopathy. Reproductive: Moderate prostatomegaly with median lobe enlargement. Other: No significant free fluid. No evidence of omental or peritoneal disease. Musculoskeletal: No acute osseous abnormality. Multi level lumbosacral disc bulges are mild. IMPRESSION: 1. Distal esophageal primary which is felt to be partially obstructive. 2. Hepatic metastasis. 3. Borderline to mild thoracic adenopathy, which is suspicious based on distribution. Similarly, gastrohepatic ligament nodes are upper normal in size but suspicious based on location. 4. Nonspecific bilateral pulmonary nodules, superimposed upon centrilobular emphysema. 5.  Coronary artery atherosclerosis.  Aortic atherosclerosis. 6. Hepatic steatosis. 7. Prostatomegaly with a component of bladder outlet obstruction. Electronically Signed   By: Abigail Miyamoto M.D.   On: 08/18/2016 15:04   Ct Abdomen Pelvis W Contrast  Result Date: 08/18/2016 CLINICAL DATA:  Esophageal cancer. EXAM: CT CHEST, ABDOMEN, AND PELVIS WITH CONTRAST TECHNIQUE: Multidetector CT imaging of the chest, abdomen and pelvis was  performed following the standard protocol during bolus administration of intravenous contrast. CONTRAST:  121mL ISOVUE-300 IOPAMIDOL (ISOVUE-300) INJECTION 61% COMPARISON:  09/28/2010 abdominopelvic CT. Chest radiograph 07/21/2016. Today's endoscopy report which demonstrates a large distal esophageal mass. FINDINGS: CT CHEST FINDINGS Cardiovascular: Aortic and branch vessel atherosclerosis. Normal heart size, without pericardial effusion. Multivessel coronary artery atherosclerosis. No central pulmonary embolism, on this non-dedicated study. Mediastinum/Nodes: No supraclavicular adenopathy. Borderline right paratracheal adenopathy at 10 mm on image 26/series 2. A left mediastinal node is positioned immediately anterior to the esophagus on image 35/ series 2 and measures 11 mm. Left infrahilar node is borderline enlarged at 10 mm on image 32/series 2. Tiny hiatal hernia. Subtle distal esophageal soft tissue fullness with suggestion of hyper enhancement including on image 53/series 2. The more proximal esophagus is minimally dilated with fluid level within. Lungs/Pleura: No pleural fluid. Lower lobe predominant bronchial wall thickening. Probable secretions in lateral left lower lobe segmental bronchus on image 99/series 3. Moderate centrilobular emphysema. Bilateral small nonspecific pulmonary nodules. Examples at 4 mm in the right upper lobe on image 54/ series 3 and 3 mm in the left upper lobe on image 57/series 3. There is a posterior left upper lobe calcified granuloma. Minimal paraseptal emphysema. Musculoskeletal: No acute osseous abnormality. CT ABDOMEN PELVIS FINDINGS Hepatobiliary: New liver lesions are consistent with metastatic disease. High left hepatic lobe 12 mm lesion on image 52/series 2 with subtle peripheral enhancement. High right hepatic lobe lesion also measures 12 mm on image 53/series 2. Pericholecystic liver lesion measures 13 mm on image 69/ series 2. Underlying hepatic steatosis. Normal  gallbladder, without biliary ductal dilatation. Pancreas: Normal, without mass or ductal dilatation. Spleen: Normal in size, without focal abnormality. Adrenals/Urinary Tract: Normal adrenal glands. Normal kidneys, without hydronephrosis. Median lobe impression into the urinary bladder is slightly worse on the left. Mild bladder wall thickening is likely secondary. Stomach/Bowel: Normal remainder of the stomach. Scattered colonic diverticula. Normal terminal ileum and appendix. Normal small bowel. Vascular/Lymphatic: Advanced aortic and branch vessel atherosclerosis. Gastrohepatic ligament nodes are upper normal at 9 mm, including on image 58/series 2. Otherwise, no abdominal retroperitoneal adenopathy. There are mildly prominent but likely reactive ileocolic mesenteric nodes. No pelvic sidewall adenopathy. Reproductive: Moderate prostatomegaly with median lobe enlargement. Other: No significant free fluid. No evidence of omental or peritoneal disease. Musculoskeletal: No acute osseous abnormality. Multi level lumbosacral disc bulges are mild. IMPRESSION: 1. Distal esophageal primary which is felt to be partially obstructive. 2. Hepatic metastasis. 3. Borderline to mild thoracic adenopathy, which is suspicious based on distribution. Similarly, gastrohepatic ligament nodes are upper normal in size but suspicious based on location. 4. Nonspecific bilateral pulmonary nodules, superimposed upon centrilobular emphysema. 5.  Coronary artery atherosclerosis. Aortic atherosclerosis. 6. Hepatic steatosis. 7. Prostatomegaly with a component of bladder outlet obstruction. Electronically Signed   By: Abigail Miyamoto M.D.   On: 08/18/2016 15:04    Labs:  CBC:  Recent Labs  01/28/16 1524 07/21/16 1301 08/30/16 1158  WBC 15.5* 15.4* 15.5*  HGB 15.9 16.6 17.1*  HCT 47.5 48.9 49.0  PLT 300 264 336    COAGS:  Recent Labs  08/30/16 1158  INR 1.00    BMP:  Recent Labs  07/21/16 1301 08/30/16 1158  NA 135  138  K 3.8 3.5  CL 105 104  CO2 19* 22  GLUCOSE 107* 108*  BUN 13 11  CALCIUM 9.6 9.5  CREATININE 0.79 0.92  GFRNONAA  --  >60  GFRAA  --  >60    LIVER FUNCTION TESTS:  Recent Labs  07/21/16 1301  BILITOT 0.5  AST 14  ALT 14  ALKPHOS 90  PROT 7.4  ALBUMIN 4.4    TUMOR MARKERS: No results for input(s): AFPTM, CEA, CA199, CHROMGRNA in the last 8760 hours.  Assessment and Plan: 66 y.o. male with history of recently diagnosed esophageal cancer and imaging findings suggestive of metastatic disease who presents today for Port-A-Cath placement for planned chemotherapy.Risks and benefits discussed with the patient/family including, but not limited to bleeding, infection, pneumothorax, or fibrin sheath development and need for additional procedures.All of the patient's questions were answered, patient is agreeable to proceed. Consent signed and in chart. Patient was noted  to have chronically elevated WBC, currently 15.5 - discussed with Dr. Benay Spice and he feels like this is most likely due to patient's long term tobacco abuse. Patient currently afebrile and not on steroid therapy. He has not received Neupogen. Will proceed with port placement today.      Thank you for this interesting consult.  I greatly enjoyed meeting Justin [name] and look forward to participating in their care.  A copy of this report was sent to the requesting provider on this date.  Electronically Signed: D. Rowe Robert 08/30/2016, 2:10 PM   I spent a total of  20 minutes   in face to face in clinical consultation, greater than 50% of which was counseling/coordinating care for Port-A-Cath placement

## 2016-08-30 NOTE — Procedures (Signed)
Interventional Radiology Procedure Note  Procedure: Placement of a right IJ approach single lumen PowerPort.  Tip is positioned at the superior cavoatrial junction and catheter is ready for immediate use.  Complications: None Recommendations:  - Ok to shower tomorrow - Do not submerge for 7 days - Routine line care   Signed,  Tashaun Obey S. Herchel Hopkin, DO   

## 2016-08-30 NOTE — Discharge Instructions (Signed)
Implanted Port Home Guide °An implanted port is a type of central line that is placed under the skin. Central lines are used to provide IV access when treatment or nutrition needs to be given through a person's veins. Implanted ports are used for long-term IV access. An implanted port may be placed because:  °· You need IV medicine that would be irritating to the small veins in your hands or arms.   °· You need long-term IV medicines, such as antibiotics.   °· You need IV nutrition for a long period.   °· You need frequent blood draws for lab tests.   °· You need dialysis.   °Implanted ports are usually placed in the chest area, but they can also be placed in the upper arm, the abdomen, or the leg. An implanted port has two main parts:  °· Reservoir. The reservoir is round and will appear as a small, raised area under your skin. The reservoir is the part where a needle is inserted to give medicines or draw blood.   °· Catheter. The catheter is a thin, flexible tube that extends from the reservoir. The catheter is placed into a large vein. Medicine that is inserted into the reservoir goes into the catheter and then into the vein.   °HOW WILL I CARE FOR MY INCISION SITE? °Do not get the incision site wet. Bathe or shower as directed by your health care provider.  °HOW IS MY PORT ACCESSED? °Special steps must be taken to access the port:  °· Before the port is accessed, a numbing cream can be placed on the skin. This helps numb the skin over the port site.   °· Your health care provider uses a sterile technique to access the port. °¨ Your health care provider must put on a mask and sterile gloves. °¨ The skin over your port is cleaned carefully with an antiseptic and allowed to dry. °¨ The port is gently pinched between sterile gloves, and a needle is inserted into the port. °· Only "non-coring" port needles should be used to access the port. Once the port is accessed, a blood return should be checked. This helps  ensure that the port is in the vein and is not clogged.   °· If your port needs to remain accessed for a constant infusion, a clear (transparent) bandage will be placed over the needle site. The bandage and needle will need to be changed every week, or as directed by your health care provider.   °· Keep the bandage covering the needle clean and dry. Do not get it wet. Follow your health care provider's instructions on how to take a shower or bath while the port is accessed.   °· If your port does not need to stay accessed, no bandage is needed over the port.   °WHAT IS FLUSHING? °Flushing helps keep the port from getting clogged. Follow your health care provider's instructions on how and when to flush the port. Ports are usually flushed with saline solution or a medicine called heparin. The need for flushing will depend on how the port is used.  °· If the port is used for intermittent medicines or blood draws, the port will need to be flushed:   °¨ After medicines have been given.   °¨ After blood has been drawn.   °¨ As part of routine maintenance.   °· If a constant infusion is running, the port may not need to be flushed.   °HOW LONG WILL MY PORT STAY IMPLANTED? °The port can stay in for as long as your health care   provider thinks it is needed. When it is time for the port to come out, surgery will be done to remove it. The procedure is similar to the one performed when the port was put in.  °WHEN SHOULD I SEEK IMMEDIATE MEDICAL CARE? °When you have an implanted port, you should seek immediate medical care if:  °· You notice a bad smell coming from the incision site.   °· You have swelling, redness, or drainage at the incision site.   °· You have more swelling or pain at the port site or the surrounding area.   °· You have a fever that is not controlled with medicine. °This information is not intended to replace advice given to you by your health care provider. Make sure you discuss any questions you have with  your health care provider. °Document Released: 07/10/2005 Document Revised: 04/30/2013 Document Reviewed: 03/17/2013 °Elsevier Interactive Patient Education © 2017 [employer] °Implanted Port Insertion, Care After °Refer to this sheet in the next few weeks. These instructions provide you with information on caring for yourself after your procedure. Your health care provider may also give you more specific instructions. Your treatment has been planned according to current medical practices, but problems sometimes occur. Call your health care provider if you have any problems or questions after your procedure. °WHAT TO EXPECT AFTER THE PROCEDURE °After your procedure, it is typical to have the following:  °· Discomfort at the port insertion site. Ice packs to the area will help. °· Bruising on the skin over the port. This will subside in 3-4 days. °HOME CARE INSTRUCTIONS °· After your port is placed, you will get a manufacturer's information card. The card has information about your port. Keep this card with you at all times.   °· Know what kind of port you have. There are many types of ports available.   °· Wear a medical alert bracelet in case of an emergency. This can help alert health care workers that you have a port.   °· The port can stay in for as long as your health care provider believes it is necessary.   °· A home health care nurse may give medicines and take care of the port.   °· You or a family member can get special training and directions for giving medicine and taking care of the port at home.   °SEEK MEDICAL CARE IF:  °· Your port does not flush or you are unable to get a blood return.   °· You have a fever or chills. °SEEK IMMEDIATE MEDICAL CARE IF: °· You have new fluid or pus coming from your incision.   °· You notice a bad smell coming from your incision site.   °· You have swelling, pain, or more redness at the incision or port site.   °· You have chest pain or shortness of breath. °This  information is not intended to replace advice given to you by your health care provider. Make sure you discuss any questions you have with your health care provider. °Document Released: 04/30/2013 Document Revised: 07/15/2013 Document Reviewed: 04/30/2013 °Elsevier Interactive Patient Education © 2017 [employer] °Moderate Conscious Sedation, Adult, Care After °These instructions provide you with information about caring for yourself after your procedure. Your health care provider may also give you more specific instructions. Your treatment has been planned according to current medical practices, but problems sometimes occur. Call your health care provider if you have any problems or questions after your procedure. °What can I expect after the procedure? °After your procedure, it is common: °·   To feel sleepy for several hours. °· To feel clumsy and have poor balance for several hours. °· To have poor judgment for several hours. °· To vomit if you eat too soon. °Follow these instructions at home: °For at least 24 hours after the procedure:  °· Do not: °¨ Participate in activities where you could fall or become injured. °¨ Drive. °¨ Use heavy machinery. °¨ Drink alcohol. °¨ Take sleeping pills or medicines that cause drowsiness. °¨ Make important decisions or sign legal documents. °¨ Take care of children on your own. °· Rest. °Eating and drinking °· Follow the diet recommended by your health care provider. °· If you vomit: °¨ Drink water, juice, or soup when you can drink without vomiting. °¨ Make sure you have little or no nausea before eating solid foods. °General instructions °· Have a responsible adult stay with you until you are awake and alert. °· Take over-the-counter and prescription medicines only as told by your health care provider. °· If you smoke, do not smoke without supervision. °· Keep all follow-up visits as told by your health care provider. This is important. °Contact a health care provider  if: °· You keep feeling nauseous or you keep vomiting. °· You feel light-headed. °· You develop a rash. °· You have a fever. °Get help right away if: °· You have trouble breathing. °This information is not intended to replace advice given to you by your health care provider. Make sure you discuss any questions you have with your health care provider. °Document Released: 04/30/2013 Document Revised: 12/13/2015 Document Reviewed: 10/30/2015 °Elsevier Interactive Patient Education © 2017 [employer] ° °

## 2016-08-31 ENCOUNTER — Encounter: Payer: Self-pay | Admitting: *Deleted

## 2016-08-31 ENCOUNTER — Other Ambulatory Visit: Payer: BLUE CROSS/BLUE SHIELD

## 2016-08-31 ENCOUNTER — Other Ambulatory Visit (HOSPITAL_BASED_OUTPATIENT_CLINIC_OR_DEPARTMENT_OTHER): Payer: BLUE CROSS/BLUE SHIELD

## 2016-08-31 DIAGNOSIS — C155 Malignant neoplasm of lower third of esophagus: Secondary | ICD-10-CM

## 2016-08-31 DIAGNOSIS — C159 Malignant neoplasm of esophagus, unspecified: Secondary | ICD-10-CM

## 2016-08-31 LAB — CBC WITH DIFFERENTIAL/PLATELET
BASO%: 0.7 % (ref 0.0–2.0)
Basophils Absolute: 0.1 10*3/uL (ref 0.0–0.1)
EOS ABS: 0.1 10*3/uL (ref 0.0–0.5)
EOS%: 0.8 % (ref 0.0–7.0)
HCT: 49.2 % (ref 38.4–49.9)
HEMOGLOBIN: 16.4 g/dL (ref 13.0–17.1)
LYMPH%: 22.3 % (ref 14.0–49.0)
MCH: 30.1 pg (ref 27.2–33.4)
MCHC: 33.3 g/dL (ref 32.0–36.0)
MCV: 90.6 fL (ref 79.3–98.0)
MONO#: 1.2 10*3/uL — ABNORMAL HIGH (ref 0.1–0.9)
MONO%: 9.2 % (ref 0.0–14.0)
NEUT%: 67 % (ref 39.0–75.0)
NEUTROS ABS: 8.9 10*3/uL — AB (ref 1.5–6.5)
Platelets: 279 10*3/uL (ref 140–400)
RBC: 5.43 10*6/uL (ref 4.20–5.82)
RDW: 14 % (ref 11.0–14.6)
WBC: 13.2 10*3/uL — AB (ref 4.0–10.3)
lymph#: 3 10*3/uL (ref 0.9–3.3)

## 2016-08-31 LAB — COMPREHENSIVE METABOLIC PANEL
ALT: 16 U/L (ref 0–55)
AST: 15 U/L (ref 5–34)
Albumin: 3.8 g/dL (ref 3.5–5.0)
Alkaline Phosphatase: 107 U/L (ref 40–150)
Anion Gap: 8 mEq/L (ref 3–11)
BILIRUBIN TOTAL: 0.44 mg/dL (ref 0.20–1.20)
BUN: 11 mg/dL (ref 7.0–26.0)
CO2: 24 meq/L (ref 22–29)
Calcium: 10 mg/dL (ref 8.4–10.4)
Chloride: 105 mEq/L (ref 98–109)
Creatinine: 0.8 mg/dL (ref 0.7–1.3)
GLUCOSE: 119 mg/dL (ref 70–140)
POTASSIUM: 3.7 meq/L (ref 3.5–5.1)
SODIUM: 138 meq/L (ref 136–145)
TOTAL PROTEIN: 7.5 g/dL (ref 6.4–8.3)

## 2016-08-31 LAB — CEA (IN HOUSE-CHCC): CEA (CHCC-In House): 25.61 ng/mL — ABNORMAL HIGH (ref 0.00–5.00)

## 2016-09-03 ENCOUNTER — Other Ambulatory Visit: Payer: Self-pay | Admitting: Oncology

## 2016-09-04 ENCOUNTER — Ambulatory Visit: Payer: BLUE CROSS/BLUE SHIELD

## 2016-09-04 ENCOUNTER — Other Ambulatory Visit: Payer: BLUE CROSS/BLUE SHIELD

## 2016-09-04 ENCOUNTER — Ambulatory Visit (HOSPITAL_BASED_OUTPATIENT_CLINIC_OR_DEPARTMENT_OTHER): Payer: Medicare Other

## 2016-09-04 ENCOUNTER — Ambulatory Visit (HOSPITAL_BASED_OUTPATIENT_CLINIC_OR_DEPARTMENT_OTHER): Payer: BLUE CROSS/BLUE SHIELD | Admitting: Nurse Practitioner

## 2016-09-04 VITALS — BP 147/85 | HR 96 | Temp 98.2°F | Resp 18 | Wt 194.3 lb

## 2016-09-04 DIAGNOSIS — C787 Secondary malignant neoplasm of liver and intrahepatic bile duct: Secondary | ICD-10-CM | POA: Diagnosis not present

## 2016-09-04 DIAGNOSIS — C155 Malignant neoplasm of lower third of esophagus: Secondary | ICD-10-CM

## 2016-09-04 DIAGNOSIS — I10 Essential (primary) hypertension: Secondary | ICD-10-CM | POA: Diagnosis not present

## 2016-09-04 DIAGNOSIS — Z5111 Encounter for antineoplastic chemotherapy: Secondary | ICD-10-CM

## 2016-09-04 DIAGNOSIS — E119 Type 2 diabetes mellitus without complications: Secondary | ICD-10-CM | POA: Diagnosis not present

## 2016-09-04 DIAGNOSIS — J449 Chronic obstructive pulmonary disease, unspecified: Secondary | ICD-10-CM

## 2016-09-04 MED ORDER — DEXAMETHASONE SODIUM PHOSPHATE 10 MG/ML IJ SOLN
10.0000 mg | Freq: Once | INTRAMUSCULAR | Status: AC
  Administered 2016-09-04: 10 mg via INTRAVENOUS

## 2016-09-04 MED ORDER — LEUCOVORIN CALCIUM INJECTION 350 MG
400.0000 mg/m2 | Freq: Once | INTRAVENOUS | Status: AC
  Administered 2016-09-04: 832 mg via INTRAVENOUS
  Filled 2016-09-04: qty 41.6

## 2016-09-04 MED ORDER — FLUOROURACIL CHEMO INJECTION 2.5 GM/50ML
400.0000 mg/m2 | Freq: Once | INTRAVENOUS | Status: AC
  Administered 2016-09-04: 850 mg via INTRAVENOUS
  Filled 2016-09-04: qty 17

## 2016-09-04 MED ORDER — DEXTROSE 5 % IV SOLN
Freq: Once | INTRAVENOUS | Status: AC
Start: 2016-09-04 — End: 2016-09-04
  Administered 2016-09-04: 11:00:00 via INTRAVENOUS

## 2016-09-04 MED ORDER — PALONOSETRON HCL INJECTION 0.25 MG/5ML
INTRAVENOUS | Status: AC
  Filled 2016-09-04: qty 5

## 2016-09-04 MED ORDER — SODIUM CHLORIDE 0.9 % IV SOLN
2400.0000 mg/m2 | INTRAVENOUS | Status: DC
  Administered 2016-09-04: 5000 mg via INTRAVENOUS
  Filled 2016-09-04: qty 100

## 2016-09-04 MED ORDER — OXALIPLATIN CHEMO INJECTION 100 MG/20ML
85.0000 mg/m2 | Freq: Once | INTRAVENOUS | Status: AC
  Administered 2016-09-04: 175 mg via INTRAVENOUS
  Filled 2016-09-04: qty 35

## 2016-09-04 MED ORDER — DEXAMETHASONE SODIUM PHOSPHATE 10 MG/ML IJ SOLN
INTRAMUSCULAR | Status: AC
  Filled 2016-09-04: qty 1

## 2016-09-04 MED ORDER — PALONOSETRON HCL INJECTION 0.25 MG/5ML
0.2500 mg | Freq: Once | INTRAVENOUS | Status: AC
  Administered 2016-09-04: 0.25 mg via INTRAVENOUS

## 2016-09-04 NOTE — Patient Instructions (Signed)

## 2016-09-04 NOTE — Patient Instructions (Signed)
[facility] Discharge Instructions for Patients Receiving Chemotherapy  Today you received the following chemotherapy agents: Oxaliplatin, Leucovorin, and Adrucil   To help prevent nausea and vomiting after your treatment, we encourage you to take your nausea medication as directed.    If you develop nausea and vomiting that is not controlled by your nausea medication, call the clinic.   BELOW ARE SYMPTOMS THAT SHOULD BE REPORTED IMMEDIATELY:  *FEVER GREATER THAN 100.5 F  *CHILLS WITH OR WITHOUT FEVER  NAUSEA AND VOMITING THAT IS NOT CONTROLLED WITH YOUR NAUSEA MEDICATION  *UNUSUAL SHORTNESS OF BREATH  *UNUSUAL BRUISING OR BLEEDING  TENDERNESS IN MOUTH AND THROAT WITH OR WITHOUT PRESENCE OF ULCERS  *URINARY PROBLEMS  *BOWEL PROBLEMS  UNUSUAL RASH Items with * indicate a potential emergency and should be followed up as soon as possible.  Feel free to call the clinic you have any questions or concerns. The clinic phone number is [phone].  Please show the CHEMO ALERT CARD at check-in to the Emergency Department and triage nurse.   

## 2016-09-04 NOTE — Progress Notes (Signed)
  [facility] OFFICE PROGRESS NOTE   Diagnosis:  Esophagus cancer  INTERVAL HISTORY:   Justin [name] returns as scheduled. He is tolerating soft solids and fluids. He has periodic nausea/vomiting. Bowels moving regularly. No pre-existing numbness or tingling in his hands or feet.  Objective:  Vital signs in last 24 hours:  Blood pressure (!) 147/85, pulse 96, temperature 98.2 F (36.8 C), temperature source Oral, resp. rate 18, weight 194 lb 4.8 oz (88.1 kg), SpO2 100 %.    HEENT: No thrush or ulcers. Posterior palate is erythematous. Resp: Lungs clear bilaterally. Cardio: Regular rate and rhythm. GI: Abdomen soft and nontender. No hepatomegaly. Vascular: No leg edema. Portacath without erythema.   Lab Results:  Lab Results  Component Value Date   WBC 13.2 (H) 08/31/2016   HGB 16.4 08/31/2016   HCT 49.2 08/31/2016   MCV 90.6 08/31/2016   PLT 279 08/31/2016   NEUTROABS 8.9 (H) 08/31/2016    Imaging:  No results found.  Medications: I have reviewed the patient's current medications.  Assessment/Plan: 1. 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 Staging CTs 08/18/2016 with nonspecific pulmonary nodules, liver metastases, borderline thoracic and gastrohepatic ligament lymphadenopathy ? Cycle 1 FOLFOX 09/04/2016 2. Diabetes  3.   COPD  4.  Hypertension  5.  History of colon polyps  6.  Port-A-Cath placement 08/30/2016   Disposition: Justin [name] appears stable. The plan is to proceed with cycle 1 FOLFOX today as scheduled. We reviewed potential toxicities associated with the treatment. Questions were answered. He will return for a follow-up visit and cycle 2 in 2 weeks. He understands to contact the office in the interim with any problems.  Plan reviewed with Dr. Benay Spice.    Ned Card ANP/GNP-BC   09/04/2016  10:28 AM

## 2016-09-06 ENCOUNTER — Ambulatory Visit (HOSPITAL_BASED_OUTPATIENT_CLINIC_OR_DEPARTMENT_OTHER): Payer: BLUE CROSS/BLUE SHIELD

## 2016-09-06 VITALS — BP 121/69 | HR 85 | Temp 98.2°F | Resp 18

## 2016-09-06 DIAGNOSIS — C155 Malignant neoplasm of lower third of esophagus: Secondary | ICD-10-CM

## 2016-09-06 DIAGNOSIS — Z452 Encounter for adjustment and management of vascular access device: Secondary | ICD-10-CM

## 2016-09-06 MED ORDER — SODIUM CHLORIDE 0.9% FLUSH
10.0000 mL | INTRAVENOUS | Status: DC | PRN
  Filled 2016-09-06: qty 10

## 2016-09-06 MED ORDER — HEPARIN SOD (PORK) LOCK FLUSH 100 UNIT/ML IV SOLN
500.0000 [IU] | Freq: Once | INTRAVENOUS | Status: DC | PRN
  Filled 2016-09-06: qty 5

## 2016-09-08 ENCOUNTER — Encounter (HOSPITAL_COMMUNITY): Payer: Self-pay

## 2016-09-08 ENCOUNTER — Ambulatory Visit (HOSPITAL_COMMUNITY): Admit: 2016-09-08 | Payer: BLUE CROSS/BLUE SHIELD | Admitting: Gastroenterology

## 2016-09-08 SURGERY — UPPER ENDOSCOPIC ULTRASOUND (EUS) LINEAR
Anesthesia: Monitor Anesthesia Care

## 2016-09-09 ENCOUNTER — Telehealth: Payer: Self-pay | Admitting: Oncology

## 2016-09-09 NOTE — Telephone Encounter (Signed)
Lvm advising appt 3/12 @ 9.15am.

## 2016-09-17 ENCOUNTER — Other Ambulatory Visit: Payer: Self-pay | Admitting: Oncology

## 2016-09-18 ENCOUNTER — Ambulatory Visit: Payer: Medicare Other

## 2016-09-18 ENCOUNTER — Ambulatory Visit: Payer: Medicare Other | Admitting: Nutrition

## 2016-09-18 ENCOUNTER — Other Ambulatory Visit (HOSPITAL_BASED_OUTPATIENT_CLINIC_OR_DEPARTMENT_OTHER): Payer: BLUE CROSS/BLUE SHIELD

## 2016-09-18 ENCOUNTER — Ambulatory Visit (HOSPITAL_BASED_OUTPATIENT_CLINIC_OR_DEPARTMENT_OTHER): Payer: Medicare Other

## 2016-09-18 ENCOUNTER — Ambulatory Visit (HOSPITAL_BASED_OUTPATIENT_CLINIC_OR_DEPARTMENT_OTHER): Payer: Medicare Other | Admitting: Oncology

## 2016-09-18 ENCOUNTER — Encounter: Payer: Self-pay | Admitting: Oncology

## 2016-09-18 VITALS — BP 128/89 | HR 90 | Temp 97.9°F | Resp 18 | Wt 183.2 lb

## 2016-09-18 DIAGNOSIS — Z5111 Encounter for antineoplastic chemotherapy: Secondary | ICD-10-CM

## 2016-09-18 DIAGNOSIS — C787 Secondary malignant neoplasm of liver and intrahepatic bile duct: Secondary | ICD-10-CM

## 2016-09-18 DIAGNOSIS — R1310 Dysphagia, unspecified: Secondary | ICD-10-CM

## 2016-09-18 DIAGNOSIS — C155 Malignant neoplasm of lower third of esophagus: Secondary | ICD-10-CM

## 2016-09-18 DIAGNOSIS — Z95828 Presence of other vascular implants and grafts: Secondary | ICD-10-CM

## 2016-09-18 LAB — CBC WITH DIFFERENTIAL/PLATELET
BASO%: 0.1 % (ref 0.0–2.0)
Basophils Absolute: 0 10*3/uL (ref 0.0–0.1)
EOS%: 0.6 % (ref 0.0–7.0)
Eosinophils Absolute: 0.1 10*3/uL (ref 0.0–0.5)
HEMATOCRIT: 42 % (ref 38.4–49.9)
HEMOGLOBIN: 14.6 g/dL (ref 13.0–17.1)
LYMPH%: 20.9 % (ref 14.0–49.0)
MCH: 30.4 pg (ref 27.2–33.4)
MCHC: 34.8 g/dL (ref 32.0–36.0)
MCV: 87.3 fL (ref 79.3–98.0)
MONO#: 1.5 10*3/uL — AB (ref 0.1–0.9)
MONO%: 15.8 % — ABNORMAL HIGH (ref 0.0–14.0)
NEUT#: 5.9 10*3/uL (ref 1.5–6.5)
NEUT%: 62.6 % (ref 39.0–75.0)
PLATELETS: 261 10*3/uL (ref 140–400)
RBC: 4.81 10*6/uL (ref 4.20–5.82)
RDW: 13.1 % (ref 11.0–14.6)
WBC: 9.5 10*3/uL (ref 4.0–10.3)
lymph#: 2 10*3/uL (ref 0.9–3.3)

## 2016-09-18 LAB — COMPREHENSIVE METABOLIC PANEL
ALT: 13 U/L (ref 0–55)
ANION GAP: 8 meq/L (ref 3–11)
AST: 14 U/L (ref 5–34)
Albumin: 3.5 g/dL (ref 3.5–5.0)
Alkaline Phosphatase: 118 U/L (ref 40–150)
BILIRUBIN TOTAL: 0.65 mg/dL (ref 0.20–1.20)
BUN: 10.5 mg/dL (ref 7.0–26.0)
CALCIUM: 9.6 mg/dL (ref 8.4–10.4)
CO2: 25 mEq/L (ref 22–29)
CREATININE: 0.7 mg/dL (ref 0.7–1.3)
Chloride: 105 mEq/L (ref 98–109)
EGFR: >90 mL/min/{1.73_m2} (ref >90)
Glucose: 110 mg/dl (ref 70–140)
Potassium: 3.5 mEq/L (ref 3.5–5.1)
Sodium: 138 mEq/L (ref 136–145)
TOTAL PROTEIN: 7 g/dL (ref 6.4–8.3)

## 2016-09-18 MED ORDER — FLUOROURACIL CHEMO INJECTION 2.5 GM/50ML
400.0000 mg/m2 | Freq: Once | INTRAVENOUS | Status: AC
  Administered 2016-09-18: 850 mg via INTRAVENOUS
  Filled 2016-09-18: qty 17

## 2016-09-18 MED ORDER — OXALIPLATIN CHEMO INJECTION 100 MG/20ML
85.0000 mg/m2 | Freq: Once | INTRAVENOUS | Status: AC
  Administered 2016-09-18: 175 mg via INTRAVENOUS
  Filled 2016-09-18: qty 35

## 2016-09-18 MED ORDER — SODIUM CHLORIDE 0.9 % IV SOLN
2400.0000 mg/m2 | INTRAVENOUS | Status: DC
  Administered 2016-09-18: 5000 mg via INTRAVENOUS
  Filled 2016-09-18: qty 100

## 2016-09-18 MED ORDER — DEXAMETHASONE SODIUM PHOSPHATE 10 MG/ML IJ SOLN
INTRAMUSCULAR | Status: AC
  Filled 2016-09-18: qty 1

## 2016-09-18 MED ORDER — DEXTROSE 5 % IV SOLN
Freq: Once | INTRAVENOUS | Status: AC
  Administered 2016-09-18: 11:00:00 via INTRAVENOUS

## 2016-09-18 MED ORDER — PALONOSETRON HCL INJECTION 0.25 MG/5ML
0.2500 mg | Freq: Once | INTRAVENOUS | Status: AC
  Administered 2016-09-18: 0.25 mg via INTRAVENOUS

## 2016-09-18 MED ORDER — DEXAMETHASONE SODIUM PHOSPHATE 10 MG/ML IJ SOLN
10.0000 mg | Freq: Once | INTRAMUSCULAR | Status: AC
  Administered 2016-09-18: 10 mg via INTRAVENOUS

## 2016-09-18 MED ORDER — LEUCOVORIN CALCIUM INJECTION 350 MG
400.0000 mg/m2 | Freq: Once | INTRAMUSCULAR | Status: AC
  Administered 2016-09-18: 832 mg via INTRAVENOUS
  Filled 2016-09-18: qty 41.6

## 2016-09-18 MED ORDER — PALONOSETRON HCL INJECTION 0.25 MG/5ML
INTRAVENOUS | Status: AC
  Filled 2016-09-18: qty 5

## 2016-09-18 MED ORDER — SODIUM CHLORIDE 0.9% FLUSH
10.0000 mL | INTRAVENOUS | Status: DC | PRN
  Administered 2016-09-18: 10 mL via INTRAVENOUS
  Filled 2016-09-18: qty 10

## 2016-09-18 NOTE — Progress Notes (Signed)
Nutrition follow-up completed with patient diagnosed with metastatic adenoesophageal cancer. Patient reports he feels well. His appetite is good.  He feels like he is eating well. He is consuming one Ensure Plus daily. Dysphasia continues but has not changed.  Nutrition diagnosis: Inadequate oral intake continues.  Intervention: Educated patient to try to increase calories and protein at mealtimes while increasing Ensure Plus 3 times a day between meals. Encouraged soft foods as tolerated. Provided samples of oral nutrition supplements along with coupons. Questions were answered.  Teach back method used.  Monitoring, evaluation, goals:  Patient will tolerate adequate calories and protein to promote weight maintenance throughout treatment.  Next visit: Monday, March 12, during infusion.  **Disclaimer: This note was dictated with voice recognition software. Similar sounding words can inadvertently be transcribed and this note may contain transcription errors which may not have been corrected upon publication of note.**

## 2016-09-18 NOTE — Progress Notes (Signed)
Met with patient and spouse in treatment area to introduce myself as Estate manager/land agent. Provided information on [place] and also gave a [employer] application. Gave my card for any additional financial questions or concerns.They had some questions about disability. I gave them a handout with information and advised they contact Social Security for any additional questions or concerns. They verbalized understanding.

## 2016-09-18 NOTE — Progress Notes (Signed)
  [facility] OFFICE PROGRESS NOTE   Diagnosis: Esophagus cancer  INTERVAL HISTORY:   Justin [name] returns as scheduled. He completed cycle 1 FOLFOX on 09/04/2016. He had cold sensitivity following chemotherapy. No other neuropathy symptoms. No vomiting or diarrhea. He continues to have solid dysphagia, but he is tolerating liquids. He has a poor appetite.   Objective:  Vital signs in last 24 hours:  Blood pressure 128/89, pulse 90, temperature 97.9 F (36.6 C), temperature source Oral, resp. rate 18, weight 183 lb 3.2 oz (83.1 kg), SpO2 100 %.    HEENT: No thrush or ulcers Resp: Distant breath sounds with scattered end inspiratory/expiratory wheeze, no respiratory distress Cardio: Regular rate and rhythm GI: No hepatosplenomegaly, nontender Vascular: No leg edema     Portacath/PICC-without erythema  Lab Results:  Lab Results  Component Value Date   WBC 9.5 09/18/2016   HGB 14.6 09/18/2016   HCT 42.0 09/18/2016   MCV 87.3 09/18/2016   PLT 261 09/18/2016   NEUTROABS 5.9 09/18/2016     Medications: I have reviewed the patient's current medications.  Assessment/Plan: 1. 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 Staging CTs 08/18/2016 with nonspecific pulmonary nodules, liver metastases, borderline thoracic and gastrohepatic ligament lymphadenopathy ? Cycle 1 FOLFOX 09/04/2016 ? Cycle 2 FOLFOX 09/18/2016 2. Diabetes  3. COPD  4. Hypertension  5. History of colon polyps  6.  Port-A-Cath placement 08/30/2016    Disposition:  Justin [name] appears stable. He tolerated the first cycle of FOLFOX well. The plan is to proceed with cycle 2 today. I encouraged him to increase his use of nutrition supplements. He will meet with the [facility] nutritionist today. Justin [name] will return for an office visit and chemotherapy in 2 weeks. We will check a CEA when he returns in 2 weeks.  15 minutes were  spent with the patient today. The majority of the time was used for counseling and coordination of care.  Betsy Coder, MD  09/18/2016  10:44 AM

## 2016-09-18 NOTE — Patient Instructions (Addendum)
Return to [facility] on 09/20/2016 @ 12:30 to have pump disconnected.  Discharge Instructions for Patients Receiving Chemotherapy  Today you received the following chemotherapy agents: Oxaliplatin, Leucovorin, and Adrucil.  To help prevent nausea and vomiting after your treatment, we encourage you to take your nausea medication as directed.   If you develop nausea and vomiting that is not controlled by your nausea medication, call the clinic.   BELOW ARE SYMPTOMS THAT SHOULD BE REPORTED IMMEDIATELY:  *FEVER GREATER THAN 100.5 F  *CHILLS WITH OR WITHOUT FEVER  NAUSEA AND VOMITING THAT IS NOT CONTROLLED WITH YOUR NAUSEA MEDICATION  *UNUSUAL SHORTNESS OF BREATH  *UNUSUAL BRUISING OR BLEEDING  TENDERNESS IN MOUTH AND THROAT WITH OR WITHOUT PRESENCE OF ULCERS  *URINARY PROBLEMS  *BOWEL PROBLEMS  UNUSUAL RASH Items with * indicate a potential emergency and should be followed up as soon as possible.  Feel free to call the clinic you have any questions or concerns. The clinic phone number is [phone].  Please show the [facility] at check-in to the Emergency Department and triage nurse.  Oxaliplatin Injection What is this medicine? OXALIPLATIN (ox AL i PLA tin) is a chemotherapy drug. It targets fast dividing cells, like cancer cells, and causes these cells to die. This medicine is used to treat cancers of the colon and rectum, and many other cancers. This medicine may be used for other purposes; ask your health care provider or pharmacist if you have questions. COMMON BRAND NAME(S): Eloxatin What should I tell my health care provider before I take this medicine? They need to know if you have any of these conditions: -kidney disease -an unusual or allergic reaction to oxaliplatin, other chemotherapy, other medicines, foods, dyes, or preservatives -pregnant or trying to get pregnant -breast-feeding How should I use this medicine? This drug is given as an  infusion into a vein. It is administered in a hospital or clinic by a specially trained health care professional. Talk to your pediatrician regarding the use of this medicine in children. Special care may be needed. Overdosage: If you think you have taken too much of this medicine contact a poison control center or emergency room at once. NOTE: This medicine is only for you. Do not share this medicine with others. What if I miss a dose? It is important not to miss a dose. Call your doctor or health care professional if you are unable to keep an appointment. What may interact with this medicine? -medicines to increase blood counts like filgrastim, pegfilgrastim, sargramostim -probenecid -some antibiotics like amikacin, gentamicin, neomycin, polymyxin B, streptomycin, tobramycin -zalcitabine Talk to your doctor or health care professional before taking any of these medicines: -acetaminophen -aspirin -ibuprofen -ketoprofen -naproxen This list may not describe all possible interactions. Give your health care provider a list of all the medicines, herbs, non-prescription drugs, or dietary supplements you use. Also tell them if you smoke, drink alcohol, or use illegal drugs. Some items may interact with your medicine. What should I watch for while using this medicine? Your condition will be monitored carefully while you are receiving this medicine. You will need important blood work done while you are taking this medicine. This medicine can make you more sensitive to cold. Do not drink cold drinks or use ice. Cover exposed skin before coming in contact with cold temperatures or cold objects. When out in cold weather wear warm clothing and cover your mouth and nose to warm the air that goes into your lungs.  Tell your doctor if you get sensitive to the cold. This drug may make you feel generally unwell. This is not uncommon, as chemotherapy can affect healthy cells as well as cancer cells. Report any  side effects. Continue your course of treatment even though you feel ill unless your doctor tells you to stop. In some cases, you may be given additional medicines to help with side effects. Follow all directions for their use. Call your doctor or health care professional for advice if you get a fever, chills or sore throat, or other symptoms of a cold or flu. Do not treat yourself. This drug decreases your body's ability to fight infections. Try to avoid being around people who are sick. This medicine may increase your risk to bruise or bleed. Call your doctor or health care professional if you notice any unusual bleeding. Be careful brushing and flossing your teeth or using a toothpick because you may get an infection or bleed more easily. If you have any dental work done, tell your dentist you are receiving this medicine. Avoid taking products that contain aspirin, acetaminophen, ibuprofen, naproxen, or ketoprofen unless instructed by your doctor. These medicines may hide a fever. Do not become pregnant while taking this medicine. Women should inform their doctor if they wish to become pregnant or think they might be pregnant. There is a potential for serious side effects to an unborn child. Talk to your health care professional or pharmacist for more information. Do not breast-feed an infant while taking this medicine. Call your doctor or health care professional if you get diarrhea. Do not treat yourself. What side effects may I notice from receiving this medicine? Side effects that you should report to your doctor or health care professional as soon as possible: -allergic reactions like skin rash, itching or hives, swelling of the face, lips, or tongue -low blood counts - This drug may decrease the number of white blood cells, red blood cells and platelets. You may be at increased risk for infections and bleeding. -signs of infection - fever or chills, cough, sore throat, pain or difficulty passing  urine -signs of decreased platelets or bleeding - bruising, pinpoint red spots on the skin, black, tarry stools, nosebleeds -signs of decreased red blood cells - unusually weak or tired, fainting spells, lightheadedness -breathing problems -chest pain, pressure -cough -diarrhea -jaw tightness -mouth sores -nausea and vomiting -pain, swelling, redness or irritation at the injection site -pain, tingling, numbness in the hands or feet -problems with balance, talking, walking -redness, blistering, peeling or loosening of the skin, including inside the mouth -trouble passing urine or change in the amount of urine Side effects that usually do not require medical attention (report to your doctor or health care professional if they continue or are bothersome): -changes in vision -constipation -hair loss -loss of appetite -metallic taste in the mouth or changes in taste -stomach pain This list may not describe all possible side effects. Call your doctor for medical advice about side effects. You may report side effects to FDA at 1-800-FDA-1088. Where should I keep my medicine? This drug is given in a hospital or clinic and will not be stored at home. NOTE: This sheet is a summary. It may not cover all possible information. If you have questions about this medicine, talk to your doctor, pharmacist, or health care provider.  2017 Elsevier/Gold Standard (2008-02-04 17:22:47) Leucovorin injection What is this medicine? LEUCOVORIN (loo koe VOR in) is used to prevent or treat the harmful effects  of some medicines. This medicine is used to treat anemia caused by a low amount of folic acid in the body. It is also used with 5-fluorouracil (5-FU) to treat colon cancer. This medicine may be used for other purposes; ask your health care provider or pharmacist if you have questions. What should I tell my health care provider before I take this medicine? They need to know if you have any of these  conditions: -anemia from low levels of vitamin B-12 in the blood -an unusual or allergic reaction to leucovorin, folic acid, other medicines, foods, dyes, or preservatives -pregnant or trying to get pregnant -breast-feeding How should I use this medicine? This medicine is for injection into a muscle or into a vein. It is given by a health care professional in a hospital or clinic setting. Talk to your pediatrician regarding the use of this medicine in children. Special care may be needed. Overdosage: If you think you have taken too much of this medicine contact a poison control center or emergency room at once. NOTE: This medicine is only for you. Do not share this medicine with others. What if I miss a dose? This does not apply. What may interact with this medicine? -capecitabine -fluorouracil -phenobarbital -phenytoin -primidone -trimethoprim-sulfamethoxazole This list may not describe all possible interactions. Give your health care provider a list of all the medicines, herbs, non-prescription drugs, or dietary supplements you use. Also tell them if you smoke, drink alcohol, or use illegal drugs. Some items may interact with your medicine. What should I watch for while using this medicine? Your condition will be monitored carefully while you are receiving this medicine. This medicine may increase the side effects of 5-fluorouracil, 5-FU. Tell your doctor or health care professional if you have diarrhea or mouth sores that do not get better or that get worse. What side effects may I notice from receiving this medicine? Side effects that you should report to your doctor or health care professional as soon as possible: -allergic reactions like skin rash, itching or hives, swelling of the face, lips, or tongue -breathing problems -fever, infection -mouth sores -unusual bleeding or bruising -unusually weak or tired Side effects that usually do not require medical attention (report to  your doctor or health care professional if they continue or are bothersome): -constipation or diarrhea -loss of appetite -nausea, vomiting This list may not describe all possible side effects. Call your doctor for medical advice about side effects. You may report side effects to FDA at 1-800-FDA-1088. Where should I keep my medicine? This drug is given in a hospital or clinic and will not be stored at home. NOTE: This sheet is a summary. It may not cover all possible information. If you have questions about this medicine, talk to your doctor, pharmacist, or health care provider.  2017 Elsevier/Gold Standard (2008-01-14 16:50:29)  Fluorouracil, 5-FU injection What is this medicine? FLUOROURACIL, 5-FU (flure oh YOOR a sil) is a chemotherapy drug. It slows the growth of cancer cells. This medicine is used to treat many types of cancer like breast cancer, colon or rectal cancer, pancreatic cancer, and stomach cancer. This medicine may be used for other purposes; ask your health care provider or pharmacist if you have questions. COMMON BRAND NAME(S): Adrucil What should I tell my health care provider before I take this medicine? They need to know if you have any of these conditions: -blood disorders -dihydropyrimidine dehydrogenase (DPD) deficiency -infection (especially a virus infection such as chickenpox, cold sores, or herpes) -  kidney disease -liver disease -malnourished, poor nutrition -recent or ongoing radiation therapy -an unusual or allergic reaction to fluorouracil, other chemotherapy, other medicines, foods, dyes, or preservatives -pregnant or trying to get pregnant -breast-feeding How should I use this medicine? This drug is given as an infusion or injection into a vein. It is administered in a hospital or clinic by a specially trained health care professional. Talk to your pediatrician regarding the use of this medicine in children. Special care may be needed. Overdosage: If  you think you have taken too much of this medicine contact a poison control center or emergency room at once. NOTE: This medicine is only for you. Do not share this medicine with others. What if I miss a dose? It is important not to miss your dose. Call your doctor or health care professional if you are unable to keep an appointment. What may interact with this medicine? -allopurinol -cimetidine -dapsone -digoxin -hydroxyurea -leucovorin -levamisole -medicines for seizures like ethotoin, fosphenytoin, phenytoin -medicines to increase blood counts like filgrastim, pegfilgrastim, sargramostim -medicines that treat or prevent blood clots like warfarin, enoxaparin, and dalteparin -methotrexate -metronidazole -pyrimethamine -some other chemotherapy drugs like busulfan, cisplatin, estramustine, vinblastine -trimethoprim -trimetrexate -vaccines Talk to your doctor or health care professional before taking any of these medicines: -acetaminophen -aspirin -ibuprofen -ketoprofen -naproxen This list may not describe all possible interactions. Give your health care provider a list of all the medicines, herbs, non-prescription drugs, or dietary supplements you use. Also tell them if you smoke, drink alcohol, or use illegal drugs. Some items may interact with your medicine. What should I watch for while using this medicine? Visit your doctor for checks on your progress. This drug may make you feel generally unwell. This is not uncommon, as chemotherapy can affect healthy cells as well as cancer cells. Report any side effects. Continue your course of treatment even though you feel ill unless your doctor tells you to stop. In some cases, you may be given additional medicines to help with side effects. Follow all directions for their use. Call your doctor or health care professional for advice if you get a fever, chills or sore throat, or other symptoms of a cold or flu. Do not treat yourself. This  drug decreases your body's ability to fight infections. Try to avoid being around people who are sick. This medicine may increase your risk to bruise or bleed. Call your doctor or health care professional if you notice any unusual bleeding. Be careful brushing and flossing your teeth or using a toothpick because you may get an infection or bleed more easily. If you have any dental work done, tell your dentist you are receiving this medicine. Avoid taking products that contain aspirin, acetaminophen, ibuprofen, naproxen, or ketoprofen unless instructed by your doctor. These medicines may hide a fever. Do not become pregnant while taking this medicine. Women should inform their doctor if they wish to become pregnant or think they might be pregnant. There is a potential for serious side effects to an unborn child. Talk to your health care professional or pharmacist for more information. Do not breast-feed an infant while taking this medicine. Men should inform their doctor if they wish to father a child. This medicine may lower sperm counts. Do not treat diarrhea with over the counter products. Contact your doctor if you have diarrhea that lasts more than 2 days or if it is severe and watery. This medicine can make you more sensitive to the sun. Keep out of the  sun. If you cannot avoid being in the sun, wear protective clothing and use sunscreen. Do not use sun lamps or tanning beds/booths. What side effects may I notice from receiving this medicine? Side effects that you should report to your doctor or health care professional as soon as possible: -allergic reactions like skin rash, itching or hives, swelling of the face, lips, or tongue -low blood counts - this medicine may decrease the number of white blood cells, red blood cells and platelets. You may be at increased risk for infections and bleeding. -signs of infection - fever or chills, cough, sore throat, pain or difficulty passing urine -signs of  decreased platelets or bleeding - bruising, pinpoint red spots on the skin, black, tarry stools, blood in the urine -signs of decreased red blood cells - unusually weak or tired, fainting spells, lightheadedness -breathing problems -changes in vision -chest pain -mouth sores -nausea and vomiting -pain, swelling, redness at site where injected -pain, tingling, numbness in the hands or feet -redness, swelling, or sores on hands or feet -stomach pain -unusual bleeding Side effects that usually do not require medical attention (report to your doctor or health care professional if they continue or are bothersome): -changes in finger or toe nails -diarrhea -dry or itchy skin -hair loss -headache -loss of appetite -sensitivity of eyes to the light -stomach upset -unusually teary eyes This list may not describe all possible side effects. Call your doctor for medical advice about side effects. You may report side effects to FDA at 1-800-FDA-1088. Where should I keep my medicine? This drug is given in a hospital or clinic and will not be stored at home. NOTE: This sheet is a summary. It may not cover all possible information. If you have questions about this medicine, talk to your doctor, pharmacist, or health care provider.  2017 Elsevier/Gold Standard (2007-11-13 13:53:16)

## 2016-09-20 ENCOUNTER — Ambulatory Visit (HOSPITAL_BASED_OUTPATIENT_CLINIC_OR_DEPARTMENT_OTHER): Payer: BLUE CROSS/BLUE SHIELD

## 2016-09-20 VITALS — BP 130/65 | HR 92 | Temp 97.6°F | Resp 18

## 2016-09-20 DIAGNOSIS — C155 Malignant neoplasm of lower third of esophagus: Secondary | ICD-10-CM

## 2016-09-20 DIAGNOSIS — Z452 Encounter for adjustment and management of vascular access device: Secondary | ICD-10-CM

## 2016-09-20 MED ORDER — HEPARIN SOD (PORK) LOCK FLUSH 100 UNIT/ML IV SOLN
500.0000 [IU] | Freq: Once | INTRAVENOUS | Status: AC | PRN
  Administered 2016-09-20: 500 [IU]
  Filled 2016-09-20: qty 5

## 2016-09-20 MED ORDER — SODIUM CHLORIDE 0.9% FLUSH
10.0000 mL | INTRAVENOUS | Status: DC | PRN
  Administered 2016-09-20: 10 mL
  Filled 2016-09-20: qty 10

## 2016-10-01 ENCOUNTER — Other Ambulatory Visit: Payer: Self-pay | Admitting: Oncology

## 2016-10-02 ENCOUNTER — Ambulatory Visit (HOSPITAL_BASED_OUTPATIENT_CLINIC_OR_DEPARTMENT_OTHER): Payer: Medicare Other | Admitting: Nurse Practitioner

## 2016-10-02 ENCOUNTER — Other Ambulatory Visit (HOSPITAL_BASED_OUTPATIENT_CLINIC_OR_DEPARTMENT_OTHER): Payer: Medicare Other

## 2016-10-02 ENCOUNTER — Telehealth: Payer: Self-pay | Admitting: Nurse Practitioner

## 2016-10-02 ENCOUNTER — Ambulatory Visit: Payer: BLUE CROSS/BLUE SHIELD | Admitting: Nutrition

## 2016-10-02 ENCOUNTER — Ambulatory Visit: Payer: BLUE CROSS/BLUE SHIELD

## 2016-10-02 ENCOUNTER — Ambulatory Visit (HOSPITAL_BASED_OUTPATIENT_CLINIC_OR_DEPARTMENT_OTHER): Payer: Medicare Other

## 2016-10-02 ENCOUNTER — Encounter: Payer: Self-pay | Admitting: *Deleted

## 2016-10-02 VITALS — BP 134/86 | HR 87 | Temp 97.8°F | Resp 18 | Ht 70.0 in | Wt 183.3 lb

## 2016-10-02 DIAGNOSIS — C155 Malignant neoplasm of lower third of esophagus: Secondary | ICD-10-CM

## 2016-10-02 DIAGNOSIS — Z5111 Encounter for antineoplastic chemotherapy: Secondary | ICD-10-CM

## 2016-10-02 DIAGNOSIS — J449 Chronic obstructive pulmonary disease, unspecified: Secondary | ICD-10-CM | POA: Diagnosis not present

## 2016-10-02 DIAGNOSIS — I10 Essential (primary) hypertension: Secondary | ICD-10-CM | POA: Diagnosis not present

## 2016-10-02 DIAGNOSIS — Z95828 Presence of other vascular implants and grafts: Secondary | ICD-10-CM

## 2016-10-02 LAB — CBC WITH DIFFERENTIAL/PLATELET
BASO%: 0.1 % (ref 0.0–2.0)
Basophils Absolute: 0 10*3/uL (ref 0.0–0.1)
EOS%: 1 % (ref 0.0–7.0)
Eosinophils Absolute: 0.1 10*3/uL (ref 0.0–0.5)
HEMATOCRIT: 42.4 % (ref 38.4–49.9)
HGB: 14.4 g/dL (ref 13.0–17.1)
LYMPH#: 1.7 10*3/uL (ref 0.9–3.3)
LYMPH%: 24.3 % (ref 14.0–49.0)
MCH: 29.9 pg (ref 27.2–33.4)
MCHC: 34 g/dL (ref 32.0–36.0)
MCV: 88 fL (ref 79.3–98.0)
MONO#: 0.9 10*3/uL (ref 0.1–0.9)
MONO%: 13.3 % (ref 0.0–14.0)
NEUT%: 61.3 % (ref 39.0–75.0)
NEUTROS ABS: 4.3 10*3/uL (ref 1.5–6.5)
Platelets: 163 10*3/uL (ref 140–400)
RBC: 4.82 10*6/uL (ref 4.20–5.82)
RDW: 13.6 % (ref 11.0–14.6)
WBC: 7.1 10*3/uL (ref 4.0–10.3)

## 2016-10-02 LAB — COMPREHENSIVE METABOLIC PANEL
ALT: 19 U/L (ref 0–55)
AST: 19 U/L (ref 5–34)
Albumin: 3.5 g/dL (ref 3.5–5.0)
Alkaline Phosphatase: 125 U/L (ref 40–150)
Anion Gap: 7 mEq/L (ref 3–11)
BUN: 6.9 mg/dL — ABNORMAL LOW (ref 7.0–26.0)
CALCIUM: 9.4 mg/dL (ref 8.4–10.4)
CHLORIDE: 110 meq/L — AB (ref 98–109)
CO2: 24 mEq/L (ref 22–29)
Creatinine: 0.7 mg/dL (ref 0.7–1.3)
EGFR: >90 mL/min/{1.73_m2} (ref >90)
Glucose: 125 mg/dl (ref 70–140)
POTASSIUM: 3.6 meq/L (ref 3.5–5.1)
Sodium: 141 mEq/L (ref 136–145)
Total Bilirubin: 0.34 mg/dL (ref 0.20–1.20)
Total Protein: 6.8 g/dL (ref 6.4–8.3)

## 2016-10-02 LAB — CEA (IN HOUSE-CHCC): CEA (CHCC-In House): 10.18 ng/mL — ABNORMAL HIGH (ref 0.00–5.00)

## 2016-10-02 MED ORDER — DEXAMETHASONE SODIUM PHOSPHATE 10 MG/ML IJ SOLN
INTRAMUSCULAR | Status: AC
  Filled 2016-10-02: qty 1

## 2016-10-02 MED ORDER — FLUOROURACIL CHEMO INJECTION 2.5 GM/50ML
400.0000 mg/m2 | Freq: Once | INTRAVENOUS | Status: AC
  Administered 2016-10-02: 850 mg via INTRAVENOUS
  Filled 2016-10-02: qty 17

## 2016-10-02 MED ORDER — PALONOSETRON HCL INJECTION 0.25 MG/5ML
INTRAVENOUS | Status: AC
  Filled 2016-10-02: qty 5

## 2016-10-02 MED ORDER — SODIUM CHLORIDE 0.9% FLUSH
10.0000 mL | INTRAVENOUS | Status: DC | PRN
  Filled 2016-10-02: qty 10

## 2016-10-02 MED ORDER — DEXAMETHASONE SODIUM PHOSPHATE 10 MG/ML IJ SOLN
10.0000 mg | Freq: Once | INTRAMUSCULAR | Status: AC
  Administered 2016-10-02: 10 mg via INTRAVENOUS

## 2016-10-02 MED ORDER — HEPARIN SOD (PORK) LOCK FLUSH 100 UNIT/ML IV SOLN
500.0000 [IU] | Freq: Once | INTRAVENOUS | Status: DC | PRN
  Filled 2016-10-02: qty 5

## 2016-10-02 MED ORDER — DEXTROSE 5 % IV SOLN
Freq: Once | INTRAVENOUS | Status: AC
  Administered 2016-10-02: 11:00:00 via INTRAVENOUS

## 2016-10-02 MED ORDER — PALONOSETRON HCL INJECTION 0.25 MG/5ML
0.2500 mg | Freq: Once | INTRAVENOUS | Status: AC
  Administered 2016-10-02: 0.25 mg via INTRAVENOUS

## 2016-10-02 MED ORDER — SODIUM CHLORIDE 0.9% FLUSH
10.0000 mL | INTRAVENOUS | Status: DC | PRN
  Administered 2016-10-02: 10 mL via INTRAVENOUS
  Filled 2016-10-02: qty 10

## 2016-10-02 MED ORDER — LEUCOVORIN CALCIUM INJECTION 350 MG
400.0000 mg/m2 | Freq: Once | INTRAVENOUS | Status: AC
  Administered 2016-10-02: 832 mg via INTRAVENOUS
  Filled 2016-10-02: qty 41.6

## 2016-10-02 MED ORDER — SODIUM CHLORIDE 0.9 % IV SOLN
2400.0000 mg/m2 | INTRAVENOUS | Status: DC
  Administered 2016-10-02: 5000 mg via INTRAVENOUS
  Filled 2016-10-02: qty 100

## 2016-10-02 MED ORDER — OXALIPLATIN CHEMO INJECTION 100 MG/20ML
85.0000 mg/m2 | Freq: Once | INTRAVENOUS | Status: AC
  Administered 2016-10-02: 175 mg via INTRAVENOUS
  Filled 2016-10-02: qty 35

## 2016-10-02 NOTE — Progress Notes (Signed)
[place] Work  Holiday representative met with patient in the infusion room at [name].  Patient had questions regarding Social Security Disability.  Patient has Medicare and is receiving his Social Security benefit.  CSW explained that you cannot receive Social Security Disability and Social Security Retirement.  Patient verbalized understanding and did not express any additional concerns.  CSw provided contact information and encouraged patient to call with additional questions or concerns.   Johnnye Lana, MSW, LCSW, OSW-C Clinical Social Worker [facility] [phone]

## 2016-10-02 NOTE — Patient Instructions (Signed)
Implanted Port Home Guide An implanted port is a type of central line that is placed under the skin. Central lines are used to provide IV access when treatment or nutrition needs to be given through a person's veins. Implanted ports are used for long-term IV access. An implanted port may be placed because:  You need IV medicine that would be irritating to the small veins in your hands or arms.  You need long-term IV medicines, such as antibiotics.  You need IV nutrition for a long period.  You need frequent blood draws for lab tests.  You need dialysis.  Implanted ports are usually placed in the chest area, but they can also be placed in the upper arm, the abdomen, or the leg. An implanted port has two main parts:  Reservoir. The reservoir is round and will appear as a small, raised area under your skin. The reservoir is the part where a needle is inserted to give medicines or draw blood.  Catheter. The catheter is a thin, flexible tube that extends from the reservoir. The catheter is placed into a large vein. Medicine that is inserted into the reservoir goes into the catheter and then into the vein.  How will I care for my incision site? Do not get the incision site wet. Bathe or shower as directed by your health care provider. How is my port accessed? Special steps must be taken to access the port:  Before the port is accessed, a numbing cream can be placed on the skin. This helps numb the skin over the port site.  Your health care provider uses a sterile technique to access the port. ? Your health care provider must put on a mask and sterile gloves. ? The skin over your port is cleaned carefully with an antiseptic and allowed to dry. ? The port is gently pinched between sterile gloves, and a needle is inserted into the port.  Only "non-coring" port needles should be used to access the port. Once the port is accessed, a blood return should be checked. This helps ensure that the port  is in the vein and is not clogged.  If your port needs to remain accessed for a constant infusion, a clear (transparent) bandage will be placed over the needle site. The bandage and needle will need to be changed every week, or as directed by your health care provider.  Keep the bandage covering the needle clean and dry. Do not get it wet. Follow your health care provider's instructions on how to take a shower or bath while the port is accessed.  If your port does not need to stay accessed, no bandage is needed over the port.  What is flushing? Flushing helps keep the port from getting clogged. Follow your health care provider's instructions on how and when to flush the port. Ports are usually flushed with saline solution or a medicine called heparin. The need for flushing will depend on how the port is used.  If the port is used for intermittent medicines or blood draws, the port will need to be flushed: ? After medicines have been given. ? After blood has been drawn. ? As part of routine maintenance.  If a constant infusion is running, the port may not need to be flushed.  How long will my port stay implanted? The port can stay in for as long as your health care provider thinks it is needed. When it is time for the port to come out, surgery will be   done to remove it. The procedure is similar to the one performed when the port was put in. When should I seek immediate medical care? When you have an implanted port, you should seek immediate medical care if:  You notice a bad smell coming from the incision site.  You have swelling, redness, or drainage at the incision site.  You have more swelling or pain at the port site or the surrounding area.  You have a fever that is not controlled with medicine.  This information is not intended to replace advice given to you by your health care provider. Make sure you discuss any questions you have with your health care provider. Document  Released: 07/10/2005 Document Revised: 12/16/2015 Document Reviewed: 03/17/2013 Elsevier Interactive Patient Education  2017 [employer]  

## 2016-10-02 NOTE — Progress Notes (Signed)
Nutrition follow-up completed in infusion with patient diagnosed with metastatic adeno esophageal cancer. Weight is stable and documented as 183.3 pounds on March 12 stable from 183.2 pounds February 26. Patient has a good appetite reports he eats well. He reports if he overeats at mealtimes, he vomits. He denies diarrhea or constipation. He is drinking one Ensure Plus a day.  Nutrition diagnosis: Inadequate oral intake improved.  Intervention: Patient was educated to continue strategies for small frequent meals and snacks to promote weight stability. Recommended patient continue ensure plus once a day. Questions were answered.  Teach back method used.  Monitoring, evaluation, goals:  Patient will tolerate increased calories and protein to minimize further weight loss.  Next visit: Monday, April 9, during infusion.  **Disclaimer: This note was dictated with voice recognition software. Similar sounding words can inadvertently be transcribed and this note may contain transcription errors which may not have been corrected upon publication of note.**

## 2016-10-02 NOTE — Progress Notes (Signed)
  [facility] OFFICE PROGRESS NOTE   Diagnosis:  Esophagus cancer  INTERVAL HISTORY:   Justin [name] returns as scheduled. He completed cycle 2 FOLFOX 09/18/2016. He denies nausea/vomiting. No mouth sores. No diarrhea. Cold sensitivity lasted about 1 week. No persistent neuropathy symptoms. Dysphagia is better. He is tolerating liquids and some solids. Appetite is better.  Objective:  Vital signs in last 24 hours:  Blood pressure 134/86, pulse 87, temperature 97.8 F (36.6 C), temperature source Oral, resp. rate 18, height 5\' 10"  (1.778 m), weight 183 lb 4.8 oz (83.1 kg), SpO2 100 %.    HEENT: No thrush or ulcers. Resp: Lungs clear bilaterally. Cardio: Regular rate and rhythm. GI: Abdomen soft and nontender. No hepatomegaly. Vascular: No leg edema. Neuro: Vibratory sense mildly decreased over the fingertips per tuning fork exam.  Port-A-Cath without erythema.  Lab Results:  Lab Results  Component Value Date   WBC 7.1 10/02/2016   HGB 14.4 10/02/2016   HCT 42.4 10/02/2016   MCV 88.0 10/02/2016   PLT 163 10/02/2016   NEUTROABS 4.3 10/02/2016    Imaging:  No results found.  Medications: I have reviewed the patient's current medications.  Assessment/Plan: 1. 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 Staging CTs 08/18/2016 with nonspecific pulmonary nodules, liver metastases, borderline thoracic and gastrohepatic ligament lymphadenopathy ? Cycle 1 FOLFOX 09/04/2016 ? Cycle 2 FOLFOX 09/18/2016 ? Cycle 3 FOLFOX 10/02/2016 2. Diabetes  3. COPD  4. Hypertension  5. History of colon polyps  6. Port-A-Cath placement 08/30/2016   Disposition: Justin [name] appears stable. He has completed 2 cycles of FOLFOX. Dysphagia is better. Plan to proceed with cycle 3 today as scheduled. He will return for a follow-up visit and cycle 4 in 2 weeks. He will contact the office in the interim with any  problems.    Ned Card ANP/GNP-BC   10/02/2016  10:26 AM

## 2016-10-02 NOTE — Patient Instructions (Signed)
Return to [facility] on   Discharge Instructions for Patients Receiving Chemotherapy  Today you received the following chemotherapy agents: Oxaliplatin, Leucovorin, and Adrucil.  To help prevent nausea and vomiting after your treatment, we encourage you to take your nausea medication as directed.   If you develop nausea and vomiting that is not controlled by your nausea medication, call the clinic.   BELOW ARE SYMPTOMS THAT SHOULD BE REPORTED IMMEDIATELY:  *FEVER GREATER THAN 100.5 F  *CHILLS WITH OR WITHOUT FEVER  NAUSEA AND VOMITING THAT IS NOT CONTROLLED WITH YOUR NAUSEA MEDICATION  *UNUSUAL SHORTNESS OF BREATH  *UNUSUAL BRUISING OR BLEEDING  TENDERNESS IN MOUTH AND THROAT WITH OR WITHOUT PRESENCE OF ULCERS  *URINARY PROBLEMS  *BOWEL PROBLEMS  UNUSUAL RASH Items with * indicate a potential emergency and should be followed up as soon as possible.  Feel free to call the clinic you have any questions or concerns. The clinic phone number is [phone].  Please show the [facility] at check-in to the Emergency Department and triage nurse.

## 2016-10-02 NOTE — Telephone Encounter (Signed)
Appointments scheduled per 3/12 LOS. Patient given AVS report and calendars with future scheduled appointments. °

## 2016-10-03 ENCOUNTER — Telehealth: Payer: Self-pay | Admitting: *Deleted

## 2016-10-03 NOTE — Telephone Encounter (Signed)
-----   Message from Owens Shark, NP sent at 10/02/2016  3:51 PM EDT ----- Please let him know the CEA tumor marker is better. F/U as scheduled.

## 2016-10-03 NOTE — Telephone Encounter (Signed)
Telephone call to patient and advised lab results. Pt understands to call this office with any concerns or questions. He confirms next scheduled appt.

## 2016-10-04 ENCOUNTER — Ambulatory Visit (HOSPITAL_BASED_OUTPATIENT_CLINIC_OR_DEPARTMENT_OTHER): Payer: Medicare Other

## 2016-10-04 VITALS — BP 129/78 | HR 82 | Temp 97.8°F | Resp 18

## 2016-10-04 DIAGNOSIS — C787 Secondary malignant neoplasm of liver and intrahepatic bile duct: Secondary | ICD-10-CM

## 2016-10-04 DIAGNOSIS — C155 Malignant neoplasm of lower third of esophagus: Secondary | ICD-10-CM | POA: Diagnosis not present

## 2016-10-04 DIAGNOSIS — Z452 Encounter for adjustment and management of vascular access device: Secondary | ICD-10-CM

## 2016-10-04 MED ORDER — HEPARIN SOD (PORK) LOCK FLUSH 100 UNIT/ML IV SOLN
500.0000 [IU] | Freq: Once | INTRAVENOUS | Status: AC | PRN
  Administered 2016-10-04: 500 [IU]
  Filled 2016-10-04: qty 5

## 2016-10-04 MED ORDER — SODIUM CHLORIDE 0.9% FLUSH
10.0000 mL | INTRAVENOUS | Status: DC | PRN
  Administered 2016-10-04: 10 mL
  Filled 2016-10-04: qty 10

## 2016-10-15 ENCOUNTER — Other Ambulatory Visit: Payer: Self-pay | Admitting: Oncology

## 2016-10-16 ENCOUNTER — Ambulatory Visit: Payer: Medicare Other

## 2016-10-16 ENCOUNTER — Ambulatory Visit (HOSPITAL_BASED_OUTPATIENT_CLINIC_OR_DEPARTMENT_OTHER): Payer: Medicare Other

## 2016-10-16 ENCOUNTER — Telehealth: Payer: Self-pay | Admitting: Oncology

## 2016-10-16 ENCOUNTER — Other Ambulatory Visit (HOSPITAL_BASED_OUTPATIENT_CLINIC_OR_DEPARTMENT_OTHER): Payer: Medicare Other

## 2016-10-16 ENCOUNTER — Ambulatory Visit (HOSPITAL_BASED_OUTPATIENT_CLINIC_OR_DEPARTMENT_OTHER): Payer: Medicare Other | Admitting: Oncology

## 2016-10-16 VITALS — BP 130/70 | HR 83 | Temp 97.7°F | Resp 18 | Ht 70.0 in | Wt 185.4 lb

## 2016-10-16 DIAGNOSIS — C155 Malignant neoplasm of lower third of esophagus: Secondary | ICD-10-CM | POA: Diagnosis not present

## 2016-10-16 DIAGNOSIS — E119 Type 2 diabetes mellitus without complications: Secondary | ICD-10-CM

## 2016-10-16 DIAGNOSIS — C787 Secondary malignant neoplasm of liver and intrahepatic bile duct: Secondary | ICD-10-CM

## 2016-10-16 DIAGNOSIS — Z5111 Encounter for antineoplastic chemotherapy: Secondary | ICD-10-CM

## 2016-10-16 DIAGNOSIS — I10 Essential (primary) hypertension: Secondary | ICD-10-CM

## 2016-10-16 LAB — CBC WITH DIFFERENTIAL/PLATELET
BASO%: 0.5 % (ref 0.0–2.0)
Basophils Absolute: 0 10*3/uL (ref 0.0–0.1)
EOS%: 1.4 % (ref 0.0–7.0)
Eosinophils Absolute: 0.1 10*3/uL (ref 0.0–0.5)
HCT: 43.1 % (ref 38.4–49.9)
HGB: 14.5 g/dL (ref 13.0–17.1)
LYMPH%: 16.6 % (ref 14.0–49.0)
MCH: 30.3 pg (ref 27.2–33.4)
MCHC: 33.7 g/dL (ref 32.0–36.0)
MCV: 89.9 fL (ref 79.3–98.0)
MONO#: 1 10*3/uL — AB (ref 0.1–0.9)
MONO%: 11.2 % (ref 0.0–14.0)
NEUT%: 70.3 % (ref 39.0–75.0)
NEUTROS ABS: 6.1 10*3/uL (ref 1.5–6.5)
Platelets: 159 10*3/uL (ref 140–400)
RBC: 4.79 10*6/uL (ref 4.20–5.82)
RDW: 14.3 % (ref 11.0–14.6)
WBC: 8.6 10*3/uL (ref 4.0–10.3)
lymph#: 1.4 10*3/uL (ref 0.9–3.3)

## 2016-10-16 LAB — COMPREHENSIVE METABOLIC PANEL
ALBUMIN: 3.5 g/dL (ref 3.5–5.0)
ALK PHOS: 122 U/L (ref 40–150)
ALT: 25 U/L (ref 0–55)
ANION GAP: 10 meq/L (ref 3–11)
AST: 21 U/L (ref 5–34)
BILIRUBIN TOTAL: 0.31 mg/dL (ref 0.20–1.20)
BUN: 8.7 mg/dL (ref 7.0–26.0)
CALCIUM: 9.5 mg/dL (ref 8.4–10.4)
CO2: 23 mEq/L (ref 22–29)
Chloride: 106 mEq/L (ref 98–109)
Creatinine: 0.7 mg/dL (ref 0.7–1.3)
Glucose: 198 mg/dl — ABNORMAL HIGH (ref 70–140)
Potassium: 3.5 mEq/L (ref 3.5–5.1)
Sodium: 139 mEq/L (ref 136–145)
TOTAL PROTEIN: 6.9 g/dL (ref 6.4–8.3)

## 2016-10-16 MED ORDER — DEXTROSE 5 % IV SOLN
Freq: Once | INTRAVENOUS | Status: AC
  Administered 2016-10-16: 10:00:00 via INTRAVENOUS

## 2016-10-16 MED ORDER — DEXAMETHASONE SODIUM PHOSPHATE 10 MG/ML IJ SOLN
INTRAMUSCULAR | Status: AC
Start: 2016-10-16 — End: 2016-10-16
  Filled 2016-10-16: qty 1

## 2016-10-16 MED ORDER — OXALIPLATIN CHEMO INJECTION 100 MG/20ML
85.0000 mg/m2 | Freq: Once | INTRAVENOUS | Status: AC
  Administered 2016-10-16: 175 mg via INTRAVENOUS
  Filled 2016-10-16: qty 35

## 2016-10-16 MED ORDER — PALONOSETRON HCL INJECTION 0.25 MG/5ML
0.2500 mg | Freq: Once | INTRAVENOUS | Status: AC
  Administered 2016-10-16: 0.25 mg via INTRAVENOUS

## 2016-10-16 MED ORDER — PALONOSETRON HCL INJECTION 0.25 MG/5ML
INTRAVENOUS | Status: AC
Start: 2016-10-16 — End: 2016-10-16
  Filled 2016-10-16: qty 5

## 2016-10-16 MED ORDER — FLUOROURACIL CHEMO INJECTION 5 GM/100ML
2400.0000 mg/m2 | INTRAVENOUS | Status: DC
  Administered 2016-10-16: 5000 mg via INTRAVENOUS
  Filled 2016-10-16: qty 100

## 2016-10-16 MED ORDER — LEUCOVORIN CALCIUM INJECTION 350 MG
400.0000 mg/m2 | Freq: Once | INTRAVENOUS | Status: AC
  Administered 2016-10-16: 832 mg via INTRAVENOUS
  Filled 2016-10-16: qty 41.6

## 2016-10-16 MED ORDER — FLUOROURACIL CHEMO INJECTION 2.5 GM/50ML
400.0000 mg/m2 | Freq: Once | INTRAVENOUS | Status: AC
  Administered 2016-10-16: 850 mg via INTRAVENOUS
  Filled 2016-10-16: qty 17

## 2016-10-16 MED ORDER — DEXAMETHASONE SODIUM PHOSPHATE 10 MG/ML IJ SOLN
10.0000 mg | Freq: Once | INTRAMUSCULAR | Status: AC
  Administered 2016-10-16: 10 mg via INTRAVENOUS

## 2016-10-16 NOTE — Telephone Encounter (Signed)
Gave patient AVS and calender per 10/16/2016 los.  

## 2016-10-16 NOTE — Patient Instructions (Signed)
Cancer Center Discharge Instructions for Patients Receiving Chemotherapy  Today you received the following chemotherapy agents Oxaliplatin, Leucovorin, Adrucil  To help prevent nausea and vomiting after your treatment, we encourage you to take your nausea medication    If you develop nausea and vomiting that is not controlled by your nausea medication, call the clinic.   BELOW ARE SYMPTOMS THAT SHOULD BE REPORTED IMMEDIATELY:  *FEVER GREATER THAN 100.5 F  *CHILLS WITH OR WITHOUT FEVER  NAUSEA AND VOMITING THAT IS NOT CONTROLLED WITH YOUR NAUSEA MEDICATION  *UNUSUAL SHORTNESS OF BREATH  *UNUSUAL BRUISING OR BLEEDING  TENDERNESS IN MOUTH AND THROAT WITH OR WITHOUT PRESENCE OF ULCERS  *URINARY PROBLEMS  *BOWEL PROBLEMS  UNUSUAL RASH Items with * indicate a potential emergency and should be followed up as soon as possible.  Feel free to call the clinic you have any questions or concerns. The clinic phone number is [phone].  Please show the CHEMO ALERT CARD at check-in to the Emergency Department and triage nurse.   

## 2016-10-16 NOTE — Progress Notes (Signed)
  [facility] OFFICE PROGRESS NOTE   Diagnosis: Esophagus cancer  INTERVAL HISTORY:   Justin [name] returns as scheduled. He completed cycle 3 FOLFOX beginning 10/02/2016. No mouth sores, nausea/vomiting, or diarrhea. He reports cold sensitivity and peripheral "tingling" following chemotherapy. This has resolved. No neuropathy symptoms at present. He has noted improvement in his appetite and dysphagia. He would like to return to work.  Objective:  Vital signs in last 24 hours:  Blood pressure 130/70, pulse 83, temperature 97.7 F (36.5 C), temperature source Oral, resp. rate 18, height 5\' 10"  (1.778 m), weight 185 lb 6.4 oz (84.1 kg), SpO2 100 %.    HEENT: No thrush or ulcers Resp: Lungs with distant breath sounds, scattered wheeze Cardio: Regular rate and rhythm GI: No hepatosplenomegaly, nontender Vascular: No leg edema Neuro: Moderate loss of vibratory sense at the fingertips bilaterally    Portacath/PICC-without erythema  Lab Results:  Lab Results  Component Value Date   WBC 8.6 10/16/2016   HGB 14.5 10/16/2016   HCT 43.1 10/16/2016   MCV 89.9 10/16/2016   PLT 159 10/16/2016   NEUTROABS 6.1 10/16/2016  10/02/2016: CEA 10.18   Medications: I have reviewed the patient's current medications.  Assessment/Plan: 1. 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 Staging CTs 08/18/2016 with nonspecific pulmonary nodules, liver metastases, borderline thoracic and gastrohepatic ligament lymphadenopathy ? Cycle 1 FOLFOX 09/04/2016 ? Cycle 2 FOLFOX 09/18/2016 ? Cycle 3 FOLFOX 10/02/2016 ? Cycle 4 FOLFOX 10/16/2016 2. Diabetes  3. COPD  4. Hypertension  5. History of colon polyps  6. Port-A-Cath placement 08/30/2016  7.  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    Disposition:  Justin [name] has completed 3 cycles of FOLFOX. He has tolerated the  chemotherapy well. The plan is to proceed with cycle 4 today. His clinical status has improved. He plans to return to work. He will return for an office visit and cycle 5 chemotherapy in 2 weeks. We will schedule a restaging CT after cycle 5. He has loss of vibratory sense in the fingertips. This may be related to diabetes or early oxaliplatin neuropathy. We will monitor this when he returns in 2 weeks.  25 minutes were spent with the patient today. The majority of the time was used for counseling and coordination of care. Betsy Coder, MD  10/16/2016  8:59 AM

## 2016-10-18 ENCOUNTER — Ambulatory Visit (HOSPITAL_BASED_OUTPATIENT_CLINIC_OR_DEPARTMENT_OTHER): Payer: Medicare Other

## 2016-10-18 VITALS — BP 112/67 | HR 84 | Temp 98.2°F | Resp 16

## 2016-10-18 DIAGNOSIS — C155 Malignant neoplasm of lower third of esophagus: Secondary | ICD-10-CM

## 2016-10-18 MED ORDER — SODIUM CHLORIDE 0.9% FLUSH
10.0000 mL | INTRAVENOUS | Status: DC | PRN
Start: 2016-10-18 — End: 2016-10-18
  Administered 2016-10-18: 10 mL
  Filled 2016-10-18: qty 10

## 2016-10-18 MED ORDER — HEPARIN SOD (PORK) LOCK FLUSH 100 UNIT/ML IV SOLN
500.0000 [IU] | Freq: Once | INTRAVENOUS | Status: AC | PRN
  Administered 2016-10-18: 500 [IU]
  Filled 2016-10-18: qty 5

## 2016-10-27 ENCOUNTER — Encounter: Payer: Self-pay | Admitting: Family Medicine

## 2016-10-27 ENCOUNTER — Ambulatory Visit (INDEPENDENT_AMBULATORY_CARE_PROVIDER_SITE_OTHER): Payer: Medicare Other | Admitting: Family Medicine

## 2016-10-27 VITALS — BP 120/70 | HR 90 | Resp 93 | Wt 187.4 lb

## 2016-10-27 DIAGNOSIS — I152 Hypertension secondary to endocrine disorders: Secondary | ICD-10-CM

## 2016-10-27 DIAGNOSIS — Z95828 Presence of other vascular implants and grafts: Secondary | ICD-10-CM

## 2016-10-27 DIAGNOSIS — R350 Frequency of micturition: Secondary | ICD-10-CM | POA: Diagnosis not present

## 2016-10-27 DIAGNOSIS — E1169 Type 2 diabetes mellitus with other specified complication: Secondary | ICD-10-CM | POA: Diagnosis not present

## 2016-10-27 DIAGNOSIS — C155 Malignant neoplasm of lower third of esophagus: Secondary | ICD-10-CM

## 2016-10-27 DIAGNOSIS — I10 Essential (primary) hypertension: Secondary | ICD-10-CM

## 2016-10-27 DIAGNOSIS — E1159 Type 2 diabetes mellitus with other circulatory complications: Secondary | ICD-10-CM

## 2016-10-27 DIAGNOSIS — E1121 Type 2 diabetes mellitus with diabetic nephropathy: Secondary | ICD-10-CM | POA: Diagnosis not present

## 2016-10-27 DIAGNOSIS — E785 Hyperlipidemia, unspecified: Secondary | ICD-10-CM | POA: Diagnosis not present

## 2016-10-27 DIAGNOSIS — E118 Type 2 diabetes mellitus with unspecified complications: Secondary | ICD-10-CM

## 2016-10-27 DIAGNOSIS — N401 Enlarged prostate with lower urinary tract symptoms: Secondary | ICD-10-CM | POA: Diagnosis not present

## 2016-10-27 LAB — POCT GLYCOSYLATED HEMOGLOBIN (HGB A1C): HEMOGLOBIN A1C: 6.6

## 2016-10-27 NOTE — Progress Notes (Signed)
   Subjective:    Patient ID: Justin [name], male    DOB: 1951-04-01, 66 y.o.   MRN: [id number]  HPI He is here for a recheck. He is now being followed by oncology for treatment of esophageal cancer. He is getting chemotherapy for this. He does have difficulty with fatigue after his chemotherapy. He seems be tolerating this fairly well. He is also had some difficulty with urinary frequency especially at night. He does note that fluid intake does affect is and when he cuts back on fluids at night it does decrease his nocturia. He continues on lisinopril as well as simvastatin and is having no difficulty with that. He also takes Januvia and is on finasteride. Continues also on Flomax. He does have a Port-A-Cath and seems to be handling that fairly well. He is considering going back to work on a part-time basis. He will discuss this further with his oncologist.   Review of Systems     Objective:   Physical Exam Alert and in no distress. 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 I encouraged him to continue with his present regimen. Recheck here in about 4 months.

## 2016-10-29 ENCOUNTER — Other Ambulatory Visit: Payer: Self-pay | Admitting: Oncology

## 2016-10-29 DIAGNOSIS — C155 Malignant neoplasm of lower third of esophagus: Secondary | ICD-10-CM | POA: Diagnosis not present

## 2016-10-30 ENCOUNTER — Other Ambulatory Visit (HOSPITAL_BASED_OUTPATIENT_CLINIC_OR_DEPARTMENT_OTHER): Payer: Medicare Other

## 2016-10-30 ENCOUNTER — Ambulatory Visit: Payer: Medicare Other

## 2016-10-30 ENCOUNTER — Telehealth: Payer: Self-pay | Admitting: Nurse Practitioner

## 2016-10-30 ENCOUNTER — Ambulatory Visit: Payer: Medicare Other | Admitting: Nutrition

## 2016-10-30 ENCOUNTER — Ambulatory Visit (HOSPITAL_BASED_OUTPATIENT_CLINIC_OR_DEPARTMENT_OTHER): Payer: Medicare Other | Admitting: Nurse Practitioner

## 2016-10-30 ENCOUNTER — Ambulatory Visit (HOSPITAL_BASED_OUTPATIENT_CLINIC_OR_DEPARTMENT_OTHER): Payer: Medicare Other

## 2016-10-30 VITALS — BP 131/86 | HR 77 | Temp 97.9°F | Resp 18 | Ht 70.0 in | Wt 185.1 lb

## 2016-10-30 DIAGNOSIS — I10 Essential (primary) hypertension: Secondary | ICD-10-CM

## 2016-10-30 DIAGNOSIS — Z5111 Encounter for antineoplastic chemotherapy: Secondary | ICD-10-CM

## 2016-10-30 DIAGNOSIS — R197 Diarrhea, unspecified: Secondary | ICD-10-CM

## 2016-10-30 DIAGNOSIS — Z95828 Presence of other vascular implants and grafts: Secondary | ICD-10-CM

## 2016-10-30 DIAGNOSIS — C787 Secondary malignant neoplasm of liver and intrahepatic bile duct: Secondary | ICD-10-CM | POA: Diagnosis not present

## 2016-10-30 DIAGNOSIS — C155 Malignant neoplasm of lower third of esophagus: Secondary | ICD-10-CM

## 2016-10-30 LAB — CBC WITH DIFFERENTIAL/PLATELET
BASO%: 0.6 % (ref 0.0–2.0)
BASOS ABS: 0.1 10*3/uL (ref 0.0–0.1)
EOS ABS: 0.2 10*3/uL (ref 0.0–0.5)
EOS%: 1.9 % (ref 0.0–7.0)
HCT: 44.7 % (ref 38.4–49.9)
HEMOGLOBIN: 15 g/dL (ref 13.0–17.1)
LYMPH%: 21.2 % (ref 14.0–49.0)
MCH: 30.4 pg (ref 27.2–33.4)
MCHC: 33.6 g/dL (ref 32.0–36.0)
MCV: 90.3 fL (ref 79.3–98.0)
MONO#: 1.2 10*3/uL — AB (ref 0.1–0.9)
MONO%: 13.4 % (ref 0.0–14.0)
NEUT%: 62.9 % (ref 39.0–75.0)
NEUTROS ABS: 5.8 10*3/uL (ref 1.5–6.5)
Platelets: 137 10*3/uL — ABNORMAL LOW (ref 140–400)
RBC: 4.95 10*6/uL (ref 4.20–5.82)
RDW: 15.8 % — ABNORMAL HIGH (ref 11.0–14.6)
WBC: 9.2 10*3/uL (ref 4.0–10.3)
lymph#: 2 10*3/uL (ref 0.9–3.3)

## 2016-10-30 LAB — CEA (IN HOUSE-CHCC): CEA (CHCC-IN HOUSE): 5.95 ng/mL — AB (ref 0.00–5.00)

## 2016-10-30 LAB — COMPREHENSIVE METABOLIC PANEL
ALBUMIN: 3.7 g/dL (ref 3.5–5.0)
ALT: 44 U/L (ref 0–55)
AST: 36 U/L — AB (ref 5–34)
Alkaline Phosphatase: 113 U/L (ref 40–150)
Anion Gap: 11 mEq/L (ref 3–11)
BUN: 8.2 mg/dL (ref 7.0–26.0)
CHLORIDE: 108 meq/L (ref 98–109)
CO2: 19 mEq/L — ABNORMAL LOW (ref 22–29)
Calcium: 9.8 mg/dL (ref 8.4–10.4)
Creatinine: 0.7 mg/dL (ref 0.7–1.3)
EGFR: >90 mL/min/{1.73_m2} (ref >90)
GLUCOSE: 169 mg/dL — AB (ref 70–140)
POTASSIUM: 3.9 meq/L (ref 3.5–5.1)
SODIUM: 138 meq/L (ref 136–145)
Total Bilirubin: 0.39 mg/dL (ref 0.20–1.20)
Total Protein: 7.3 g/dL (ref 6.4–8.3)

## 2016-10-30 MED ORDER — DEXTROSE 5 % IV SOLN
Freq: Once | INTRAVENOUS | Status: AC
  Administered 2016-10-30: 11:00:00 via INTRAVENOUS

## 2016-10-30 MED ORDER — DEXAMETHASONE SODIUM PHOSPHATE 10 MG/ML IJ SOLN
10.0000 mg | Freq: Once | INTRAMUSCULAR | Status: AC
  Administered 2016-10-30: 10 mg via INTRAVENOUS

## 2016-10-30 MED ORDER — PALONOSETRON HCL INJECTION 0.25 MG/5ML
INTRAVENOUS | Status: AC
  Filled 2016-10-30: qty 5

## 2016-10-30 MED ORDER — PALONOSETRON HCL INJECTION 0.25 MG/5ML
0.2500 mg | Freq: Once | INTRAVENOUS | Status: AC
  Administered 2016-10-30: 0.25 mg via INTRAVENOUS

## 2016-10-30 MED ORDER — LEUCOVORIN CALCIUM INJECTION 350 MG
400.0000 mg/m2 | Freq: Once | INTRAMUSCULAR | Status: AC
  Administered 2016-10-30: 832 mg via INTRAVENOUS
  Filled 2016-10-30: qty 41.6

## 2016-10-30 MED ORDER — DEXAMETHASONE SODIUM PHOSPHATE 10 MG/ML IJ SOLN
INTRAMUSCULAR | Status: AC
  Filled 2016-10-30: qty 1

## 2016-10-30 MED ORDER — OXALIPLATIN CHEMO INJECTION 100 MG/20ML
85.0000 mg/m2 | Freq: Once | INTRAVENOUS | Status: AC
  Administered 2016-10-30: 175 mg via INTRAVENOUS
  Filled 2016-10-30: qty 35

## 2016-10-30 MED ORDER — FLUOROURACIL CHEMO INJECTION 2.5 GM/50ML
400.0000 mg/m2 | Freq: Once | INTRAVENOUS | Status: AC
  Administered 2016-10-30: 850 mg via INTRAVENOUS
  Filled 2016-10-30: qty 17

## 2016-10-30 MED ORDER — SODIUM CHLORIDE 0.9% FLUSH
10.0000 mL | INTRAVENOUS | Status: DC | PRN
  Administered 2016-10-30: 10 mL via INTRAVENOUS
  Filled 2016-10-30: qty 10

## 2016-10-30 MED ORDER — SODIUM CHLORIDE 0.9 % IV SOLN
2400.0000 mg/m2 | INTRAVENOUS | Status: DC
  Administered 2016-10-30: 5000 mg via INTRAVENOUS
  Filled 2016-10-30: qty 100

## 2016-10-30 NOTE — Progress Notes (Signed)
Nutrition follow-up completed with patient receiving treatment for metastatic esophageal cancer. Weight improved documented as 185.1 pounds. Improve 183.3 pounds March 12. Patient reports he eats well most of the time except for few days after treatment. Noted he has some loose stools in the morning for which he will try Imodium. Reports he wants to go back to work. Is not drinking any oral nutrition supplements.  Nutrition diagnosis: Inadequate oral intake has improved.  Intervention: Patient educated to continue strategies for increased calories and protein to minimize weight loss. Encouraged milkshakes as needed for additional calories and protein. Questions were answered.  Teach back method used.  Monitoring, evaluation, goals:  Patient will tolerate adequate calories and protein for weight maintenance.  Next visit: Monday, April 23, during infusion.  **Disclaimer: This note was dictated with voice recognition software. Similar sounding words can inadvertently be transcribed and this note may contain transcription errors which may not have been corrected upon publication of note.**

## 2016-10-30 NOTE — Telephone Encounter (Signed)
Patient only wanted calender - per 4/9 los. No availability on [facility] schedule for 4/23 - sent message to lisa to see if its okay to leave on her schedule.

## 2016-10-30 NOTE — Progress Notes (Signed)
  [name] OFFICE PROGRESS NOTE   Diagnosis:  Esophagus cancer  INTERVAL HISTORY:   [name] returns as scheduled. He completed cycle 4 FOLFOX 10/16/2016. He denies nausea/vomiting. No mouth sores. For the past 2 days he has had loose stools during early morning hours. He denies dysphagia. No pain. He notes tingling in the fingertips with cold exposure.  Objective:  Vital signs in last 24 hours:  Blood pressure 131/86, pulse 77, temperature 97.9 F (36.6 C), temperature source Oral, resp. rate 18, height 5\' 10"  (1.778 m), weight 185 lb 1.6 oz (84 kg), SpO2 98 %.    HEENT: No thrush or ulcers. Resp: Bilateral expiratory rhonchi. No respiratory distress. Cardio: Regular rate and rhythm. GI: Abdomen soft and nontender. No hepatomegaly. Vascular: No leg edema. Neuro: Vibratory sense moderately decreased over the fingertips per tuning fork exam.  Port-A-Cath without erythema.  Lab Results:  Lab Results  Component Value Date   WBC 8.6 10/16/2016   HGB 14.5 10/16/2016   HCT 43.1 10/16/2016   MCV 89.9 10/16/2016   PLT 159 10/16/2016   NEUTROABS 6.1 10/16/2016    Imaging:  No results found.  Medications: I have reviewed the patient's current medications.  Assessment/Plan: 1. 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 Staging CTs 08/18/2016 with nonspecific pulmonary nodules, liver metastases, borderline thoracic and gastrohepatic ligament lymphadenopathy ? Cycle 1 FOLFOX 09/04/2016 ? Cycle 2 FOLFOX 09/18/2016 ? Cycle 3 FOLFOX 10/02/2016 ? Cycle 4 FOLFOX 10/16/2016 ? Cycle 5 FOLFOX 10/30/2016 2. Diabetes  3. COPD  4. Hypertension  5. History of colon polyps  6. Port-A-Cath placement 08/30/2016  7.  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   Disposition: Justin [name] appears stable. He has completed 4 cycles of FOLFOX. Plan to proceed with  cycle 5 today as scheduled. We are referring him for restaging CT scans prior to his return visit in 2 weeks.  For the past few days he has had loose stools in the early morning hours. If this increases he will try Imodium.  He will return for a follow-up visit on 11/13/2016. He will contact the office in the interim with any problems.    Ned Card ANP/GNP-BC   10/30/2016  10:33 AM

## 2016-10-30 NOTE — Patient Instructions (Signed)
[facility] Discharge Instructions for Patients Receiving Chemotherapy  Today you received the following chemotherapy agents 5 FU/Leucovorin/Oxaliplatin To help prevent nausea and vomiting after your treatment, we encourage you to take your nausea medication as prescribed.   If you develop nausea and vomiting that is not controlled by your nausea medication, call the clinic.   BELOW ARE SYMPTOMS THAT SHOULD BE REPORTED IMMEDIATELY:  *FEVER GREATER THAN 100.5 F  *CHILLS WITH OR WITHOUT FEVER  NAUSEA AND VOMITING THAT IS NOT CONTROLLED WITH YOUR NAUSEA MEDICATION  *UNUSUAL SHORTNESS OF BREATH  *UNUSUAL BRUISING OR BLEEDING  TENDERNESS IN MOUTH AND THROAT WITH OR WITHOUT PRESENCE OF ULCERS  *URINARY PROBLEMS  *BOWEL PROBLEMS  UNUSUAL RASH Items with * indicate a potential emergency and should be followed up as soon as possible.  Feel free to call the clinic you have any questions or concerns. The clinic phone number is [phone].  Please show the CHEMO ALERT CARD at check-in to the Emergency Department and triage nurse.   

## 2016-10-30 NOTE — Patient Instructions (Signed)
Implanted Port Home Guide An implanted port is a type of central line that is placed under the skin. Central lines are used to provide IV access when treatment or nutrition needs to be given through a person's veins. Implanted ports are used for long-term IV access. An implanted port may be placed because:  You need IV medicine that would be irritating to the small veins in your hands or arms.  You need long-term IV medicines, such as antibiotics.  You need IV nutrition for a long period.  You need frequent blood draws for lab tests.  You need dialysis.  Implanted ports are usually placed in the chest area, but they can also be placed in the upper arm, the abdomen, or the leg. An implanted port has two main parts:  Reservoir. The reservoir is round and will appear as a small, raised area under your skin. The reservoir is the part where a needle is inserted to give medicines or draw blood.  Catheter. The catheter is a thin, flexible tube that extends from the reservoir. The catheter is placed into a large vein. Medicine that is inserted into the reservoir goes into the catheter and then into the vein.  How will I care for my incision site? Do not get the incision site wet. Bathe or shower as directed by your health care provider. How is my port accessed? Special steps must be taken to access the port:  Before the port is accessed, a numbing cream can be placed on the skin. This helps numb the skin over the port site.  Your health care provider uses a sterile technique to access the port. ? Your health care provider must put on a mask and sterile gloves. ? The skin over your port is cleaned carefully with an antiseptic and allowed to dry. ? The port is gently pinched between sterile gloves, and a needle is inserted into the port.  Only "non-coring" port needles should be used to access the port. Once the port is accessed, a blood return should be checked. This helps ensure that the port  is in the vein and is not clogged.  If your port needs to remain accessed for a constant infusion, a clear (transparent) bandage will be placed over the needle site. The bandage and needle will need to be changed every week, or as directed by your health care provider.  Keep the bandage covering the needle clean and dry. Do not get it wet. Follow your health care provider's instructions on how to take a shower or bath while the port is accessed.  If your port does not need to stay accessed, no bandage is needed over the port.  What is flushing? Flushing helps keep the port from getting clogged. Follow your health care provider's instructions on how and when to flush the port. Ports are usually flushed with saline solution or a medicine called heparin. The need for flushing will depend on how the port is used.  If the port is used for intermittent medicines or blood draws, the port will need to be flushed: ? After medicines have been given. ? After blood has been drawn. ? As part of routine maintenance.  If a constant infusion is running, the port may not need to be flushed.  How long will my port stay implanted? The port can stay in for as long as your health care provider thinks it is needed. When it is time for the port to come out, surgery will be   done to remove it. The procedure is similar to the one performed when the port was put in. When should I seek immediate medical care? When you have an implanted port, you should seek immediate medical care if:  You notice a bad smell coming from the incision site.  You have swelling, redness, or drainage at the incision site.  You have more swelling or pain at the port site or the surrounding area.  You have a fever that is not controlled with medicine.  This information is not intended to replace advice given to you by your health care provider. Make sure you discuss any questions you have with your health care provider. Document  Released: 07/10/2005 Document Revised: 12/16/2015 Document Reviewed: 03/17/2013 Elsevier Interactive Patient Education  2017 [employer]  

## 2016-11-01 ENCOUNTER — Ambulatory Visit (HOSPITAL_BASED_OUTPATIENT_CLINIC_OR_DEPARTMENT_OTHER): Payer: Medicare Other

## 2016-11-01 VITALS — BP 120/70 | HR 76 | Temp 97.9°F | Resp 18

## 2016-11-01 DIAGNOSIS — C155 Malignant neoplasm of lower third of esophagus: Secondary | ICD-10-CM

## 2016-11-01 DIAGNOSIS — Z452 Encounter for adjustment and management of vascular access device: Secondary | ICD-10-CM

## 2016-11-01 DIAGNOSIS — Z95828 Presence of other vascular implants and grafts: Secondary | ICD-10-CM

## 2016-11-01 MED ORDER — HEPARIN SOD (PORK) LOCK FLUSH 100 UNIT/ML IV SOLN
500.0000 [IU] | Freq: Once | INTRAVENOUS | Status: AC | PRN
  Administered 2016-11-01: 500 [IU] via INTRAVENOUS
  Filled 2016-11-01: qty 5

## 2016-11-01 MED ORDER — SODIUM CHLORIDE 0.9% FLUSH
10.0000 mL | INTRAVENOUS | Status: DC | PRN
  Administered 2016-11-01: 10 mL via INTRAVENOUS
  Filled 2016-11-01: qty 10

## 2016-11-02 DIAGNOSIS — C155 Malignant neoplasm of lower third of esophagus: Secondary | ICD-10-CM | POA: Diagnosis not present

## 2016-11-10 ENCOUNTER — Ambulatory Visit (HOSPITAL_COMMUNITY)
Admission: RE | Admit: 2016-11-10 | Discharge: 2016-11-10 | Disposition: A | Discharge status: Home / Self Care | Payer: Medicare Other | Source: Ambulatory Visit | Attending: Nurse Practitioner | Admitting: Nurse Practitioner

## 2016-11-10 DIAGNOSIS — R918 Other nonspecific abnormal finding of lung field: Secondary | ICD-10-CM | POA: Insufficient documentation

## 2016-11-10 DIAGNOSIS — N400 Enlarged prostate without lower urinary tract symptoms: Secondary | ICD-10-CM | POA: Diagnosis not present

## 2016-11-10 DIAGNOSIS — C155 Malignant neoplasm of lower third of esophagus: Secondary | ICD-10-CM | POA: Diagnosis not present

## 2016-11-10 DIAGNOSIS — C159 Malignant neoplasm of esophagus, unspecified: Secondary | ICD-10-CM | POA: Diagnosis not present

## 2016-11-10 DIAGNOSIS — R590 Localized enlarged lymph nodes: Secondary | ICD-10-CM | POA: Diagnosis not present

## 2016-11-10 DIAGNOSIS — I700 Atherosclerosis of aorta: Secondary | ICD-10-CM | POA: Diagnosis not present

## 2016-11-10 DIAGNOSIS — C787 Secondary malignant neoplasm of liver and intrahepatic bile duct: Secondary | ICD-10-CM | POA: Diagnosis not present

## 2016-11-10 MED ORDER — IOPAMIDOL (ISOVUE-300) INJECTION 61%
100.0000 mL | Freq: Once | INTRAVENOUS | Status: AC | PRN
  Administered 2016-11-10: 100 mL via INTRAVENOUS

## 2016-11-10 MED ORDER — IOPAMIDOL (ISOVUE-300) INJECTION 61%
INTRAVENOUS | Status: AC
  Filled 2016-11-10: qty 100

## 2016-11-12 ENCOUNTER — Other Ambulatory Visit: Payer: Self-pay | Admitting: Oncology

## 2016-11-13 ENCOUNTER — Ambulatory Visit (HOSPITAL_BASED_OUTPATIENT_CLINIC_OR_DEPARTMENT_OTHER): Payer: Medicare Other

## 2016-11-13 ENCOUNTER — Ambulatory Visit: Payer: Medicare Other

## 2016-11-13 ENCOUNTER — Ambulatory Visit (HOSPITAL_BASED_OUTPATIENT_CLINIC_OR_DEPARTMENT_OTHER): Payer: Medicare Other | Admitting: Nurse Practitioner

## 2016-11-13 ENCOUNTER — Other Ambulatory Visit (HOSPITAL_BASED_OUTPATIENT_CLINIC_OR_DEPARTMENT_OTHER): Payer: Medicare Other

## 2016-11-13 ENCOUNTER — Ambulatory Visit: Payer: Medicare Other | Admitting: Nutrition

## 2016-11-13 ENCOUNTER — Telehealth: Payer: Self-pay | Admitting: Oncology

## 2016-11-13 VITALS — BP 153/83 | HR 75 | Temp 98.2°F | Resp 18 | Wt 191.2 lb

## 2016-11-13 DIAGNOSIS — Z5111 Encounter for antineoplastic chemotherapy: Secondary | ICD-10-CM | POA: Diagnosis not present

## 2016-11-13 DIAGNOSIS — C155 Malignant neoplasm of lower third of esophagus: Secondary | ICD-10-CM

## 2016-11-13 DIAGNOSIS — C787 Secondary malignant neoplasm of liver and intrahepatic bile duct: Secondary | ICD-10-CM

## 2016-11-13 DIAGNOSIS — J449 Chronic obstructive pulmonary disease, unspecified: Secondary | ICD-10-CM

## 2016-11-13 DIAGNOSIS — I10 Essential (primary) hypertension: Secondary | ICD-10-CM | POA: Diagnosis not present

## 2016-11-13 DIAGNOSIS — Z95828 Presence of other vascular implants and grafts: Secondary | ICD-10-CM

## 2016-11-13 LAB — COMPREHENSIVE METABOLIC PANEL
ALT: 22 U/L (ref 0–55)
ANION GAP: 10 meq/L (ref 3–11)
AST: 20 U/L (ref 5–34)
Albumin: 3.4 g/dL — ABNORMAL LOW (ref 3.5–5.0)
Alkaline Phosphatase: 101 U/L (ref 40–150)
BILIRUBIN TOTAL: 0.39 mg/dL (ref 0.20–1.20)
BUN: 6.7 mg/dL — ABNORMAL LOW (ref 7.0–26.0)
CALCIUM: 9.3 mg/dL (ref 8.4–10.4)
CHLORIDE: 108 meq/L (ref 98–109)
CO2: 21 mEq/L — ABNORMAL LOW (ref 22–29)
CREATININE: 0.7 mg/dL (ref 0.7–1.3)
EGFR: >90 mL/min/{1.73_m2} (ref >90)
Glucose: 168 mg/dl — ABNORMAL HIGH (ref 70–140)
Potassium: 3.4 mEq/L — ABNORMAL LOW (ref 3.5–5.1)
Sodium: 139 mEq/L (ref 136–145)
Total Protein: 6.7 g/dL (ref 6.4–8.3)

## 2016-11-13 LAB — CBC WITH DIFFERENTIAL/PLATELET
BASO%: 0.3 % (ref 0.0–2.0)
BASOS ABS: 0 10*3/uL (ref 0.0–0.1)
EOS%: 1 % (ref 0.0–7.0)
Eosinophils Absolute: 0.1 10*3/uL (ref 0.0–0.5)
HEMATOCRIT: 40.9 % (ref 38.4–49.9)
HGB: 14 g/dL (ref 13.0–17.1)
LYMPH%: 19.6 % (ref 14.0–49.0)
MCH: 30.6 pg (ref 27.2–33.4)
MCHC: 34.2 g/dL (ref 32.0–36.0)
MCV: 89.5 fL (ref 79.3–98.0)
MONO#: 1.3 10*3/uL — ABNORMAL HIGH (ref 0.1–0.9)
MONO%: 12.7 % (ref 0.0–14.0)
NEUT#: 6.6 10*3/uL — ABNORMAL HIGH (ref 1.5–6.5)
NEUT%: 66.4 % (ref 39.0–75.0)
Platelets: 117 10*3/uL — ABNORMAL LOW (ref 140–400)
RBC: 4.57 10*6/uL (ref 4.20–5.82)
RDW: 16.6 % — ABNORMAL HIGH (ref 11.0–14.6)
WBC: 10 10*3/uL (ref 4.0–10.3)
lymph#: 2 10*3/uL (ref 0.9–3.3)

## 2016-11-13 MED ORDER — DEXAMETHASONE SODIUM PHOSPHATE 10 MG/ML IJ SOLN
INTRAMUSCULAR | Status: AC
  Filled 2016-11-13: qty 1

## 2016-11-13 MED ORDER — SODIUM CHLORIDE 0.9 % IV SOLN
2400.0000 mg/m2 | INTRAVENOUS | Status: DC
  Administered 2016-11-13: 5000 mg via INTRAVENOUS
  Filled 2016-11-13: qty 100

## 2016-11-13 MED ORDER — PALONOSETRON HCL INJECTION 0.25 MG/5ML
0.2500 mg | Freq: Once | INTRAVENOUS | Status: AC
  Administered 2016-11-13: 0.25 mg via INTRAVENOUS

## 2016-11-13 MED ORDER — PALONOSETRON HCL INJECTION 0.25 MG/5ML
INTRAVENOUS | Status: AC
  Filled 2016-11-13: qty 5

## 2016-11-13 MED ORDER — HEPARIN SOD (PORK) LOCK FLUSH 100 UNIT/ML IV SOLN
500.0000 [IU] | Freq: Once | INTRAVENOUS | Status: DC | PRN
  Filled 2016-11-13: qty 5

## 2016-11-13 MED ORDER — DEXAMETHASONE SODIUM PHOSPHATE 10 MG/ML IJ SOLN
10.0000 mg | Freq: Once | INTRAMUSCULAR | Status: AC
  Administered 2016-11-13: 10 mg via INTRAVENOUS

## 2016-11-13 MED ORDER — FLUOROURACIL CHEMO INJECTION 2.5 GM/50ML
400.0000 mg/m2 | Freq: Once | INTRAVENOUS | Status: AC
  Administered 2016-11-13: 850 mg via INTRAVENOUS
  Filled 2016-11-13: qty 17

## 2016-11-13 MED ORDER — ALTEPLASE 2 MG IJ SOLR
2.0000 mg | Freq: Once | INTRAMUSCULAR | Status: DC | PRN
  Filled 2016-11-13: qty 2

## 2016-11-13 MED ORDER — DEXTROSE 5 % IV SOLN
Freq: Once | INTRAVENOUS | Status: AC
  Administered 2016-11-13: 10:00:00 via INTRAVENOUS

## 2016-11-13 MED ORDER — LEUCOVORIN CALCIUM INJECTION 350 MG
400.0000 mg/m2 | Freq: Once | INTRAVENOUS | Status: AC
  Administered 2016-11-13: 832 mg via INTRAVENOUS
  Filled 2016-11-13: qty 41.6

## 2016-11-13 MED ORDER — OXALIPLATIN CHEMO INJECTION 100 MG/20ML
85.0000 mg/m2 | Freq: Once | INTRAVENOUS | Status: AC
  Administered 2016-11-13: 175 mg via INTRAVENOUS
  Filled 2016-11-13: qty 35

## 2016-11-13 MED ORDER — SODIUM CHLORIDE 0.9% FLUSH
10.0000 mL | INTRAVENOUS | Status: DC | PRN
  Administered 2016-11-13: 10 mL via INTRAVENOUS
  Filled 2016-11-13: qty 10

## 2016-11-13 NOTE — Telephone Encounter (Signed)
Scheduled appts per 4/23 los. - unable to schedule 5/7 appts due to availability in treatment area- patients wife said she will come back for schedule later today.

## 2016-11-13 NOTE — Progress Notes (Signed)
Nutrition follow-up completed in infusion for metastatic esophageal cancer. Weight improved documented as 191.2 pounds April 23rd. Patient reports he feels well and is eating well. Reports several loose stools in the morning.  However, this is not a problem.  Nutrition diagnosis: Inadequate oral intake resolved.  Educated patient to continue strategies for adequate calories and protein for weight maintenance. Encouraged patient to contact me for any questions or concerns.

## 2016-11-13 NOTE — Patient Instructions (Signed)

## 2016-11-13 NOTE — Progress Notes (Signed)
  [facility] OFFICE PROGRESS NOTE   Diagnosis:  Esophagus cancer  INTERVAL HISTORY:   Justin [name] returns as scheduled. He completed cycle 5 FOLFOX 10/30/2016. He denies nausea/vomiting. No mouth sores. No diarrhea. He avoids cold 4-5 days after each treatment. He denies any numbness or tingling in his hands or feet. He reports a good appetite. No significant dysphagia.  Objective:  Vital signs in last 24 hours:  Blood pressure (!) 153/83, pulse 75, temperature 98.2 F (36.8 C), temperature source Oral, resp. rate 18, weight 191 lb 3.2 oz (86.7 kg), SpO2 100 %.    HEENT: No thrush or ulcers. Resp: Lungs with scattered wheezes. No respiratory distress. Cardio: Regular rate and rhythm. GI: Abdomen soft and nontender. No hepatomegaly. Vascular: No leg edema. Calves soft and nontender. Neuro: Vibratory sense moderately decreased over the fingertips on the left hand, mildly decreased fingertips on the right hand.  Port-A-Cath without erythema.    Lab Results:  Lab Results  Component Value Date   WBC 10.0 11/13/2016   HGB 14.0 11/13/2016   HCT 40.9 11/13/2016   MCV 89.5 11/13/2016   PLT 117 (L) 11/13/2016   NEUTROABS 6.6 (H) 11/13/2016    Imaging:  No results found.  Medications: I have reviewed the patient's current medications.  Assessment/Plan: 1. 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 Staging CTs 08/18/2016 with nonspecific pulmonary nodules, liver metastases, borderline thoracic and gastrohepatic ligament lymphadenopathy ? Cycle 1 FOLFOX 09/04/2016 ? Cycle 2 FOLFOX 09/18/2016 ? Cycle 3 FOLFOX 10/02/2016 ? Cycle 4 FOLFOX 10/16/2016 ? Cycle 5 FOLFOX 10/30/2016 ? Restaging CTs 11/10/2016-decreased wall thickness distal esophagus; stable borderline mediastinal lymphadenopathy; multiple liver metastases have completely resolved; stable scattered nonspecific tiny bilateral lung nodules ? Cycle 6 FOLFOX  11/13/2016 2. Diabetes  3. COPD  4. Hypertension  5. History of colon polyps  6. Port-A-Cath placement 08/30/2016  7. 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   Disposition: Justin [name] appears stable. He has completed 5 cycles of FOLFOX. Recent restaging CT scans show evidence of improvement. Plan to proceed with cycle 6 FOLFOX today as scheduled. He will return for a follow-up visit and cycle 7 in 2 weeks. He will contact the office in the interim with any problems.  Patient seen with Dr. Benay Spice. CT images reviewed on the computer with Justin [name] and his family. 25 minutes were spent face-to-face at today's visit with the majority of that time involved in counseling/coordination of care.    Ned Card ANP/GNP-BC   11/13/2016  9:51 AM  This was a shared visit with Ned Card. We reviewed the restaging CT images with Justin [name] and his family. There is evidence of a clinical and radiologic response to FOLFOX. The plan is to continue FOLFOX chemotherapy.  Julieanne Manson, M.D.

## 2016-11-14 ENCOUNTER — Telehealth: Payer: Self-pay | Admitting: Oncology

## 2016-11-14 NOTE — Telephone Encounter (Signed)
Scheduled appt per 4/23 los. And [facility] schedule appt for 5/7 to see Lattie Haw at 11;15 am- patient is aware of added appt

## 2016-11-15 ENCOUNTER — Ambulatory Visit (HOSPITAL_BASED_OUTPATIENT_CLINIC_OR_DEPARTMENT_OTHER): Payer: Medicare Other

## 2016-11-15 VITALS — BP 118/74 | HR 93 | Temp 97.8°F | Resp 20

## 2016-11-15 DIAGNOSIS — C155 Malignant neoplasm of lower third of esophagus: Secondary | ICD-10-CM

## 2016-11-15 MED ORDER — HEPARIN SOD (PORK) LOCK FLUSH 100 UNIT/ML IV SOLN
500.0000 [IU] | Freq: Once | INTRAVENOUS | Status: AC | PRN
  Administered 2016-11-15: 500 [IU]
  Filled 2016-11-15: qty 5

## 2016-11-15 MED ORDER — SODIUM CHLORIDE 0.9% FLUSH
10.0000 mL | INTRAVENOUS | Status: DC | PRN
  Administered 2016-11-15: 10 mL
  Filled 2016-11-15: qty 10

## 2016-11-15 NOTE — Patient Instructions (Signed)
Implanted Port Home Guide An implanted port is a type of central line that is placed under the skin. Central lines are used to provide IV access when treatment or nutrition needs to be given through a person's veins. Implanted ports are used for long-term IV access. An implanted port may be placed because:  You need IV medicine that would be irritating to the small veins in your hands or arms.  You need long-term IV medicines, such as antibiotics.  You need IV nutrition for a long period.  You need frequent blood draws for lab tests.  You need dialysis.  Implanted ports are usually placed in the chest area, but they can also be placed in the upper arm, the abdomen, or the leg. An implanted port has two main parts:  Reservoir. The reservoir is round and will appear as a small, raised area under your skin. The reservoir is the part where a needle is inserted to give medicines or draw blood.  Catheter. The catheter is a thin, flexible tube that extends from the reservoir. The catheter is placed into a large vein. Medicine that is inserted into the reservoir goes into the catheter and then into the vein.  How will I care for my incision site? Do not get the incision site wet. Bathe or shower as directed by your health care provider. How is my port accessed? Special steps must be taken to access the port:  Before the port is accessed, a numbing cream can be placed on the skin. This helps numb the skin over the port site.  Your health care provider uses a sterile technique to access the port. ? Your health care provider must put on a mask and sterile gloves. ? The skin over your port is cleaned carefully with an antiseptic and allowed to dry. ? The port is gently pinched between sterile gloves, and a needle is inserted into the port.  Only "non-coring" port needles should be used to access the port. Once the port is accessed, a blood return should be checked. This helps ensure that the port  is in the vein and is not clogged.  If your port needs to remain accessed for a constant infusion, a clear (transparent) bandage will be placed over the needle site. The bandage and needle will need to be changed every week, or as directed by your health care provider.  Keep the bandage covering the needle clean and dry. Do not get it wet. Follow your health care provider's instructions on how to take a shower or bath while the port is accessed.  If your port does not need to stay accessed, no bandage is needed over the port.  What is flushing? Flushing helps keep the port from getting clogged. Follow your health care provider's instructions on how and when to flush the port. Ports are usually flushed with saline solution or a medicine called heparin. The need for flushing will depend on how the port is used.  If the port is used for intermittent medicines or blood draws, the port will need to be flushed: ? After medicines have been given. ? After blood has been drawn. ? As part of routine maintenance.  If a constant infusion is running, the port may not need to be flushed.  How long will my port stay implanted? The port can stay in for as long as your health care provider thinks it is needed. When it is time for the port to come out, surgery will be   done to remove it. The procedure is similar to the one performed when the port was put in. When should I seek immediate medical care? When you have an implanted port, you should seek immediate medical care if:  You notice a bad smell coming from the incision site.  You have swelling, redness, or drainage at the incision site.  You have more swelling or pain at the port site or the surrounding area.  You have a fever that is not controlled with medicine.  This information is not intended to replace advice given to you by your health care provider. Make sure you discuss any questions you have with your health care provider. Document  Released: 07/10/2005 Document Revised: 12/16/2015 Document Reviewed: 03/17/2013 Elsevier Interactive Patient Education  2017 [employer]  

## 2016-11-26 ENCOUNTER — Other Ambulatory Visit: Payer: Self-pay | Admitting: Oncology

## 2016-11-27 ENCOUNTER — Encounter: Payer: Self-pay | Admitting: *Deleted

## 2016-11-27 ENCOUNTER — Other Ambulatory Visit (HOSPITAL_BASED_OUTPATIENT_CLINIC_OR_DEPARTMENT_OTHER): Payer: Medicare Other

## 2016-11-27 ENCOUNTER — Ambulatory Visit (HOSPITAL_BASED_OUTPATIENT_CLINIC_OR_DEPARTMENT_OTHER): Payer: Medicare Other | Admitting: Nurse Practitioner

## 2016-11-27 ENCOUNTER — Telehealth: Payer: Self-pay | Admitting: Nurse Practitioner

## 2016-11-27 ENCOUNTER — Ambulatory Visit: Payer: Medicare Other

## 2016-11-27 ENCOUNTER — Ambulatory Visit (HOSPITAL_BASED_OUTPATIENT_CLINIC_OR_DEPARTMENT_OTHER): Payer: Medicare Other

## 2016-11-27 VITALS — BP 140/78 | HR 83 | Temp 97.7°F | Resp 18 | Ht 70.0 in | Wt 190.6 lb

## 2016-11-27 DIAGNOSIS — J449 Chronic obstructive pulmonary disease, unspecified: Secondary | ICD-10-CM

## 2016-11-27 DIAGNOSIS — C155 Malignant neoplasm of lower third of esophagus: Secondary | ICD-10-CM | POA: Diagnosis not present

## 2016-11-27 DIAGNOSIS — E119 Type 2 diabetes mellitus without complications: Secondary | ICD-10-CM | POA: Diagnosis not present

## 2016-11-27 DIAGNOSIS — C787 Secondary malignant neoplasm of liver and intrahepatic bile duct: Secondary | ICD-10-CM

## 2016-11-27 DIAGNOSIS — Z5111 Encounter for antineoplastic chemotherapy: Secondary | ICD-10-CM | POA: Diagnosis not present

## 2016-11-27 LAB — COMPREHENSIVE METABOLIC PANEL
ALT: 132 U/L — ABNORMAL HIGH (ref 0–55)
AST: 95 U/L — ABNORMAL HIGH (ref 5–34)
Albumin: 3.9 g/dL (ref 3.5–5.0)
Alkaline Phosphatase: 155 U/L — ABNORMAL HIGH (ref 40–150)
Anion Gap: 9 mEq/L (ref 3–11)
BILIRUBIN TOTAL: 0.79 mg/dL (ref 0.20–1.20)
BUN: 7.5 mg/dL (ref 7.0–26.0)
CO2: 23 meq/L (ref 22–29)
CREATININE: 0.7 mg/dL (ref 0.7–1.3)
Calcium: 9.8 mg/dL (ref 8.4–10.4)
Chloride: 106 mEq/L (ref 98–109)
EGFR: >90 mL/min/{1.73_m2} (ref >90)
GLUCOSE: 148 mg/dL — AB (ref 70–140)
Potassium: 3.7 mEq/L (ref 3.5–5.1)
SODIUM: 138 meq/L (ref 136–145)
TOTAL PROTEIN: 7.7 g/dL (ref 6.4–8.3)

## 2016-11-27 LAB — CBC WITH DIFFERENTIAL/PLATELET
BASO%: 0.7 % (ref 0.0–2.0)
Basophils Absolute: 0.1 10*3/uL (ref 0.0–0.1)
EOS%: 1.7 % (ref 0.0–7.0)
Eosinophils Absolute: 0.2 10*3/uL (ref 0.0–0.5)
HCT: 46.9 % (ref 38.4–49.9)
HGB: 15.7 g/dL (ref 13.0–17.1)
LYMPH%: 22 % (ref 14.0–49.0)
MCH: 30.8 pg (ref 27.2–33.4)
MCHC: 33.4 g/dL (ref 32.0–36.0)
MCV: 92.3 fL (ref 79.3–98.0)
MONO#: 1.5 10*3/uL — AB (ref 0.1–0.9)
MONO%: 14.7 % — ABNORMAL HIGH (ref 0.0–14.0)
NEUT%: 60.9 % (ref 39.0–75.0)
NEUTROS ABS: 6.2 10*3/uL (ref 1.5–6.5)
PLATELETS: 124 10*3/uL — AB (ref 140–400)
RBC: 5.08 10*6/uL (ref 4.20–5.82)
RDW: 17.2 % — ABNORMAL HIGH (ref 11.0–14.6)
WBC: 10.2 10*3/uL (ref 4.0–10.3)
lymph#: 2.2 10*3/uL (ref 0.9–3.3)

## 2016-11-27 MED ORDER — SODIUM CHLORIDE 0.9 % IV SOLN
2400.0000 mg/m2 | INTRAVENOUS | Status: DC
  Administered 2016-11-27: 5000 mg via INTRAVENOUS
  Filled 2016-11-27: qty 100

## 2016-11-27 MED ORDER — DEXAMETHASONE SODIUM PHOSPHATE 10 MG/ML IJ SOLN
10.0000 mg | Freq: Once | INTRAMUSCULAR | Status: AC
  Administered 2016-11-27: 10 mg via INTRAVENOUS

## 2016-11-27 MED ORDER — OXALIPLATIN CHEMO INJECTION 100 MG/20ML
85.0000 mg/m2 | Freq: Once | INTRAVENOUS | Status: AC
  Administered 2016-11-27: 175 mg via INTRAVENOUS
  Filled 2016-11-27: qty 35

## 2016-11-27 MED ORDER — DEXTROSE 5 % IV SOLN
Freq: Once | INTRAVENOUS | Status: AC
  Administered 2016-11-27: 14:00:00 via INTRAVENOUS

## 2016-11-27 MED ORDER — DEXAMETHASONE SODIUM PHOSPHATE 10 MG/ML IJ SOLN
INTRAMUSCULAR | Status: AC
  Filled 2016-11-27: qty 1

## 2016-11-27 MED ORDER — PALONOSETRON HCL INJECTION 0.25 MG/5ML
INTRAVENOUS | Status: AC
  Filled 2016-11-27: qty 5

## 2016-11-27 MED ORDER — FLUOROURACIL CHEMO INJECTION 2.5 GM/50ML
400.0000 mg/m2 | Freq: Once | INTRAVENOUS | Status: AC
  Administered 2016-11-27: 850 mg via INTRAVENOUS
  Filled 2016-11-27: qty 17

## 2016-11-27 MED ORDER — DEXTROSE 5 % IV SOLN
400.0000 mg/m2 | Freq: Once | INTRAVENOUS | Status: AC
  Administered 2016-11-27: 832 mg via INTRAVENOUS
  Filled 2016-11-27: qty 41.6

## 2016-11-27 MED ORDER — SODIUM CHLORIDE 0.9 % IJ SOLN
10.0000 mL | Freq: Once | INTRAMUSCULAR | Status: AC
  Administered 2016-11-27: 10 mL via INTRAVENOUS
  Filled 2016-11-27: qty 10

## 2016-11-27 MED ORDER — PALONOSETRON HCL INJECTION 0.25 MG/5ML
0.2500 mg | Freq: Once | INTRAVENOUS | Status: AC
  Administered 2016-11-27: 0.25 mg via INTRAVENOUS

## 2016-11-27 NOTE — Progress Notes (Signed)
  [name] OFFICE PROGRESS NOTE   Diagnosis:  Esophagus cancer  INTERVAL HISTORY:   Justin [name] returns as scheduled. He completed cycle 6 FOLFOX 11/13/2016. He denies nausea/vomiting. No mouth sores. No diarrhea. No numbness or tingling in his hands or feet. Dysphagia continues to be markedly improved. He has a good appetite.  Objective:  Vital signs in last 24 hours:  Blood pressure 140/78, pulse 83, temperature 97.7 F (36.5 C), temperature source Oral, resp. rate 18, height 5\' 10"  (1.778 m), weight 190 lb 9.6 oz (86.5 kg), SpO2 99 %.    HEENT: No thrush or ulcers. Resp: Lungs with scattered wheezes. No respiratory distress. Cardio: Regular rate and rhythm. GI: Abdomen soft and nontender. No hepatomegaly. Vascular: No leg edema. Neuro: Vibratory sense mildly decreased over the fingertips per tuning fork exam.  Port-A-Cath without erythema.  Lab Results:  Lab Results  Component Value Date   WBC 10.0 11/13/2016   HGB 14.0 11/13/2016   HCT 40.9 11/13/2016   MCV 89.5 11/13/2016   PLT 117 (L) 11/13/2016   NEUTROABS 6.6 (H) 11/13/2016    Imaging:  No results found.  Medications: I have reviewed the patient's current medications.  Assessment/Plan: 1. 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 Staging CTs 08/18/2016 with nonspecific pulmonary nodules, liver metastases, borderline thoracic and gastrohepatic ligament lymphadenopathy ? Cycle 1 FOLFOX 09/04/2016 ? Cycle 2 FOLFOX 09/18/2016 ? Cycle 3 FOLFOX 10/02/2016 ? Cycle 4 FOLFOX 10/16/2016 ? Cycle 5 FOLFOX 10/30/2016 ? Restaging CTs 11/10/2016-decreased wall thickness distal esophagus; stable borderline mediastinal lymphadenopathy; multiple liver metastases have completely resolved; stable scattered nonspecific tiny bilateral lung nodules ? Cycle 6 FOLFOX 11/13/2016 ? Cycle 7 FOLFOX 11/27/2016 2. Diabetes  3. COPD  4. Hypertension  5. History  of colon polyps  6. Port-A-Cath placement 08/30/2016  7. 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 appears stable. He has completed 7 cycles of FOLFOX. Plan to proceed with cycle 8 today as scheduled pending adequate labs. He will return for a follow-up visit in 2 weeks. He will contact the office in the interim with any problems.    Ned Card ANP/GNP-BC   11/27/2016  11:18 AM

## 2016-11-27 NOTE — Telephone Encounter (Signed)
Gave patient AVS and calender per 5/7 los.  

## 2016-11-27 NOTE — Patient Instructions (Signed)

## 2016-11-27 NOTE — Progress Notes (Signed)
Per MD Benay Spice, okay to treat with CMET.

## 2016-11-27 NOTE — Patient Instructions (Signed)
[facility] Discharge Instructions for Patients Receiving Chemotherapy  Today you received the following chemotherapy agents FOLFOX.   To help prevent nausea and vomiting after your treatment, we encourage you to take your nausea medication as directed.    If you develop nausea and vomiting that is not controlled by your nausea medication, call the clinic.   BELOW ARE SYMPTOMS THAT SHOULD BE REPORTED IMMEDIATELY:  *FEVER GREATER THAN 100.5 F  *CHILLS WITH OR WITHOUT FEVER  NAUSEA AND VOMITING THAT IS NOT CONTROLLED WITH YOUR NAUSEA MEDICATION  *UNUSUAL SHORTNESS OF BREATH  *UNUSUAL BRUISING OR BLEEDING  TENDERNESS IN MOUTH AND THROAT WITH OR WITHOUT PRESENCE OF ULCERS  *URINARY PROBLEMS  *BOWEL PROBLEMS  UNUSUAL RASH Items with * indicate a potential emergency and should be followed up as soon as possible.  Feel free to call the clinic you have any questions or concerns. The clinic phone number is [phone].  Please show the CHEMO ALERT CARD at check-in to the Emergency Department and triage nurse.   

## 2016-11-27 NOTE — Progress Notes (Signed)
Per Dr. Benay Spice: OK to treat with elevated liver enzymes. Darden Dates, Infusion RN made aware.

## 2016-11-27 NOTE — Progress Notes (Signed)
Blood return noted before, during and after Adrucil push.  

## 2016-11-29 ENCOUNTER — Ambulatory Visit (HOSPITAL_BASED_OUTPATIENT_CLINIC_OR_DEPARTMENT_OTHER): Payer: Medicare Other

## 2016-11-29 VITALS — BP 110/61 | HR 95 | Temp 97.5°F | Resp 18

## 2016-11-29 DIAGNOSIS — C155 Malignant neoplasm of lower third of esophagus: Secondary | ICD-10-CM

## 2016-11-29 MED ORDER — HEPARIN SOD (PORK) LOCK FLUSH 100 UNIT/ML IV SOLN
500.0000 [IU] | Freq: Once | INTRAVENOUS | Status: AC | PRN
  Administered 2016-11-29: 500 [IU]
  Filled 2016-11-29: qty 5

## 2016-11-29 MED ORDER — SODIUM CHLORIDE 0.9% FLUSH
10.0000 mL | INTRAVENOUS | Status: DC | PRN
Start: 2016-11-29 — End: 2016-11-29
  Administered 2016-11-29: 10 mL
  Filled 2016-11-29: qty 10

## 2016-11-29 NOTE — Patient Instructions (Signed)
Implanted Port Home Guide An implanted port is a type of central line that is placed under the skin. Central lines are used to provide IV access when treatment or nutrition needs to be given through a person's veins. Implanted ports are used for long-term IV access. An implanted port may be placed because:  You need IV medicine that would be irritating to the small veins in your hands or arms.  You need long-term IV medicines, such as antibiotics.  You need IV nutrition for a long period.  You need frequent blood draws for lab tests.  You need dialysis.  Implanted ports are usually placed in the chest area, but they can also be placed in the upper arm, the abdomen, or the leg. An implanted port has two main parts:  Reservoir. The reservoir is round and will appear as a small, raised area under your skin. The reservoir is the part where a needle is inserted to give medicines or draw blood.  Catheter. The catheter is a thin, flexible tube that extends from the reservoir. The catheter is placed into a large vein. Medicine that is inserted into the reservoir goes into the catheter and then into the vein.  How will I care for my incision site? Do not get the incision site wet. Bathe or shower as directed by your health care provider. How is my port accessed? Special steps must be taken to access the port:  Before the port is accessed, a numbing cream can be placed on the skin. This helps numb the skin over the port site.  Your health care provider uses a sterile technique to access the port. ? Your health care provider must put on a mask and sterile gloves. ? The skin over your port is cleaned carefully with an antiseptic and allowed to dry. ? The port is gently pinched between sterile gloves, and a needle is inserted into the port.  Only "non-coring" port needles should be used to access the port. Once the port is accessed, a blood return should be checked. This helps ensure that the port  is in the vein and is not clogged.  If your port needs to remain accessed for a constant infusion, a clear (transparent) bandage will be placed over the needle site. The bandage and needle will need to be changed every week, or as directed by your health care provider.  Keep the bandage covering the needle clean and dry. Do not get it wet. Follow your health care provider's instructions on how to take a shower or bath while the port is accessed.  If your port does not need to stay accessed, no bandage is needed over the port.  What is flushing? Flushing helps keep the port from getting clogged. Follow your health care provider's instructions on how and when to flush the port. Ports are usually flushed with saline solution or a medicine called heparin. The need for flushing will depend on how the port is used.  If the port is used for intermittent medicines or blood draws, the port will need to be flushed: ? After medicines have been given. ? After blood has been drawn. ? As part of routine maintenance.  If a constant infusion is running, the port may not need to be flushed.  How long will my port stay implanted? The port can stay in for as long as your health care provider thinks it is needed. When it is time for the port to come out, surgery will be   done to remove it. The procedure is similar to the one performed when the port was put in. When should I seek immediate medical care? When you have an implanted port, you should seek immediate medical care if:  You notice a bad smell coming from the incision site.  You have swelling, redness, or drainage at the incision site.  You have more swelling or pain at the port site or the surrounding area.  You have a fever that is not controlled with medicine.  This information is not intended to replace advice given to you by your health care provider. Make sure you discuss any questions you have with your health care provider. Document  Released: 07/10/2005 Document Revised: 12/16/2015 Document Reviewed: 03/17/2013 Elsevier Interactive Patient Education  2017 [employer]  

## 2016-12-02 DIAGNOSIS — C155 Malignant neoplasm of lower third of esophagus: Secondary | ICD-10-CM | POA: Diagnosis not present

## 2016-12-09 ENCOUNTER — Other Ambulatory Visit: Payer: Self-pay | Admitting: Oncology

## 2016-12-11 ENCOUNTER — Other Ambulatory Visit (HOSPITAL_BASED_OUTPATIENT_CLINIC_OR_DEPARTMENT_OTHER): Payer: Medicare Other

## 2016-12-11 ENCOUNTER — Ambulatory Visit: Payer: Medicare Other

## 2016-12-11 ENCOUNTER — Telehealth: Payer: Self-pay | Admitting: Oncology

## 2016-12-11 ENCOUNTER — Ambulatory Visit (HOSPITAL_BASED_OUTPATIENT_CLINIC_OR_DEPARTMENT_OTHER): Payer: Medicare Other | Admitting: Oncology

## 2016-12-11 ENCOUNTER — Ambulatory Visit (HOSPITAL_BASED_OUTPATIENT_CLINIC_OR_DEPARTMENT_OTHER): Payer: Medicare Other

## 2016-12-11 VITALS — BP 123/77 | HR 81 | Temp 98.0°F | Resp 18 | Ht 70.0 in | Wt 189.4 lb

## 2016-12-11 DIAGNOSIS — Z5111 Encounter for antineoplastic chemotherapy: Secondary | ICD-10-CM

## 2016-12-11 DIAGNOSIS — C155 Malignant neoplasm of lower third of esophagus: Secondary | ICD-10-CM

## 2016-12-11 DIAGNOSIS — C787 Secondary malignant neoplasm of liver and intrahepatic bile duct: Secondary | ICD-10-CM

## 2016-12-11 DIAGNOSIS — J449 Chronic obstructive pulmonary disease, unspecified: Secondary | ICD-10-CM | POA: Diagnosis not present

## 2016-12-11 DIAGNOSIS — E119 Type 2 diabetes mellitus without complications: Secondary | ICD-10-CM

## 2016-12-11 DIAGNOSIS — Z95828 Presence of other vascular implants and grafts: Secondary | ICD-10-CM

## 2016-12-11 LAB — COMPREHENSIVE METABOLIC PANEL
ALBUMIN: 3.7 g/dL (ref 3.5–5.0)
ALK PHOS: 109 U/L (ref 40–150)
ALT: 27 U/L (ref 0–55)
ANION GAP: 10 meq/L (ref 3–11)
AST: 23 U/L (ref 5–34)
BILIRUBIN TOTAL: 0.43 mg/dL (ref 0.20–1.20)
BUN: 15.1 mg/dL (ref 7.0–26.0)
CALCIUM: 9.4 mg/dL (ref 8.4–10.4)
CO2: 22 mEq/L (ref 22–29)
Chloride: 108 mEq/L (ref 98–109)
Creatinine: 0.8 mg/dL (ref 0.7–1.3)
Glucose: 156 mg/dl — ABNORMAL HIGH (ref 70–140)
Potassium: 3.8 mEq/L (ref 3.5–5.1)
Sodium: 139 mEq/L (ref 136–145)
TOTAL PROTEIN: 7.4 g/dL (ref 6.4–8.3)

## 2016-12-11 LAB — CBC WITH DIFFERENTIAL/PLATELET
BASO%: 0.3 % (ref 0.0–2.0)
BASOS ABS: 0 10*3/uL (ref 0.0–0.1)
EOS ABS: 0.1 10*3/uL (ref 0.0–0.5)
EOS%: 1.2 % (ref 0.0–7.0)
HEMATOCRIT: 42.8 % (ref 38.4–49.9)
HEMOGLOBIN: 14.4 g/dL (ref 13.0–17.1)
LYMPH#: 1.5 10*3/uL (ref 0.9–3.3)
LYMPH%: 19.2 % (ref 14.0–49.0)
MCH: 31.1 pg (ref 27.2–33.4)
MCHC: 33.6 g/dL (ref 32.0–36.0)
MCV: 92.4 fL (ref 79.3–98.0)
MONO#: 1.4 10*3/uL — ABNORMAL HIGH (ref 0.1–0.9)
MONO%: 17.8 % — ABNORMAL HIGH (ref 0.0–14.0)
NEUT#: 4.7 10*3/uL (ref 1.5–6.5)
NEUT%: 61.5 % (ref 39.0–75.0)
NRBC: 0 % (ref 0–0)
PLATELETS: 126 10*3/uL — AB (ref 140–400)
RBC: 4.63 10*6/uL (ref 4.20–5.82)
RDW: 16 % — AB (ref 11.0–14.6)
WBC: 7.6 10*3/uL (ref 4.0–10.3)

## 2016-12-11 LAB — CEA (IN HOUSE-CHCC): CEA (CHCC-IN HOUSE): 3.63 ng/mL (ref 0.00–5.00)

## 2016-12-11 MED ORDER — PALONOSETRON HCL INJECTION 0.25 MG/5ML
INTRAVENOUS | Status: AC
  Filled 2016-12-11: qty 5

## 2016-12-11 MED ORDER — OXALIPLATIN CHEMO INJECTION 100 MG/20ML
85.0000 mg/m2 | Freq: Once | INTRAVENOUS | Status: AC
  Administered 2016-12-11: 175 mg via INTRAVENOUS
  Filled 2016-12-11: qty 35

## 2016-12-11 MED ORDER — PALONOSETRON HCL INJECTION 0.25 MG/5ML
0.2500 mg | Freq: Once | INTRAVENOUS | Status: AC
Start: 2016-12-11 — End: 2016-12-11
  Administered 2016-12-11: 0.25 mg via INTRAVENOUS

## 2016-12-11 MED ORDER — DEXAMETHASONE SODIUM PHOSPHATE 10 MG/ML IJ SOLN
INTRAMUSCULAR | Status: AC
  Filled 2016-12-11: qty 1

## 2016-12-11 MED ORDER — LEUCOVORIN CALCIUM INJECTION 350 MG
400.0000 mg/m2 | Freq: Once | INTRAVENOUS | Status: AC
  Administered 2016-12-11: 832 mg via INTRAVENOUS
  Filled 2016-12-11: qty 41.6

## 2016-12-11 MED ORDER — DEXAMETHASONE SODIUM PHOSPHATE 10 MG/ML IJ SOLN
10.0000 mg | Freq: Once | INTRAMUSCULAR | Status: AC
  Administered 2016-12-11: 10 mg via INTRAVENOUS

## 2016-12-11 MED ORDER — DEXTROSE 5 % IV SOLN
Freq: Once | INTRAVENOUS | Status: AC
  Administered 2016-12-11: 11:00:00 via INTRAVENOUS

## 2016-12-11 MED ORDER — SODIUM CHLORIDE 0.9% FLUSH
10.0000 mL | INTRAVENOUS | Status: DC | PRN
  Administered 2016-12-11: 10 mL via INTRAVENOUS
  Filled 2016-12-11: qty 10

## 2016-12-11 MED ORDER — FLUOROURACIL CHEMO INJECTION 2.5 GM/50ML
400.0000 mg/m2 | Freq: Once | INTRAVENOUS | Status: AC
  Administered 2016-12-11: 850 mg via INTRAVENOUS
  Filled 2016-12-11: qty 17

## 2016-12-11 MED ORDER — SODIUM CHLORIDE 0.9 % IV SOLN
2400.0000 mg/m2 | INTRAVENOUS | Status: DC
  Administered 2016-12-11: 5000 mg via INTRAVENOUS
  Filled 2016-12-11: qty 100

## 2016-12-11 NOTE — Patient Instructions (Signed)

## 2016-12-11 NOTE — Progress Notes (Signed)
Blood return noted before, during and after Adrucil push.  

## 2016-12-11 NOTE — Progress Notes (Signed)
  [place] OFFICE PROGRESS NOTE   Diagnosis: Esophagus cancer  INTERVAL HISTORY:   Mr. Dames returns as scheduled. He completed another cycle of FOLFOX 11/27/2016. No nausea/vomiting, mouth sores, or diarrhea. No neuropathy symptoms. He has dysphagia only with certain foods.  Objective:  Vital signs in last 24 hours:  Blood pressure 123/77, pulse 81, temperature 98 F (36.7 C), temperature source Oral, resp. rate 18, height '5\' 10"'$  (1.778 m), weight 189 lb 6.4 oz (85.9 kg), SpO2 97 %.    HEENT: No thrush or ulcers Resp: Lungs clear bilaterally Cardio: Regular rate and rhythm GI: No hepatosplenomegaly, nontender Vascular: No leg edema Neuro: Mild-moderate loss of vibratory sense at the fingertips bilaterally    Portacath/PICC-without erythema  Lab Results:  Lab Results  Component Value Date   WBC 7.6 12/11/2016   HGB 14.4 12/11/2016   HCT 42.8 12/11/2016   MCV 92.4 12/11/2016   PLT 126 (L) 12/11/2016   NEUTROABS 4.7 12/11/2016    CMP     Component Value Date/Time   NA 139 12/11/2016 0839   K 3.8 12/11/2016 0839   CL 104 08/30/2016 1158   CO2 22 12/11/2016 0839   GLUCOSE 156 (H) 12/11/2016 0839   BUN 15.1 12/11/2016 0839   CREATININE 0.8 12/11/2016 0839   CALCIUM 9.4 12/11/2016 0839   PROT 7.4 12/11/2016 0839   ALBUMIN 3.7 12/11/2016 0839   AST 23 12/11/2016 0839   ALT 27 12/11/2016 0839   ALKPHOS 109 12/11/2016 0839   BILITOT 0.43 12/11/2016 0839   GFRNONAA >60 08/30/2016 1158   GFRAA >60 08/30/2016 1158    Lab Results  Component Value Date   CEA1 5.95 (H) 10/30/2016     Medications: I have reviewed the patient's current medications.  Assessment/Plan: 1. 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 Staging CTs 08/18/2016 with nonspecific pulmonary nodules, liver metastases, borderline thoracic and gastrohepatic ligament lymphadenopathy ? Cycle 1 FOLFOX 09/04/2016 ? Cycle  2 FOLFOX 09/18/2016 ? Cycle 3 FOLFOX 10/02/2016 ? Cycle 4 FOLFOX 10/16/2016 ? Cycle 5 FOLFOX 10/30/2016 ? Restaging CTs 11/10/2016-decreased wall thickness distal esophagus; stable borderline mediastinal lymphadenopathy; multiple liver metastases have completely resolved; stable scattered nonspecific tiny bilateral lung nodules ? Cycle 6 FOLFOX 11/13/2016 ? Cycle 7 FOLFOX 11/27/2016 ? Cycle 8 FOLFOX 12/11/2016 2. Diabetes  3. COPD  4. Hypertension  5. History of colon polyps  6. Port-A-Cath placement 08/30/2016  7. 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     Disposition:  Mr. Charney appears stable. He will complete another cycle of FOLFOX today. The plan is to discontinue oxaliplatin after 9 or 10 cycles of FOLFOX. He will return for an office visit and chemotherapy in 2 weeks.  15 minutes were spent with the patient today. The majority of the time was used for counseling and coordination of care.  Betsy Coder, MD  12/11/2016  9:51 AM

## 2016-12-11 NOTE — Patient Instructions (Signed)
[facility] Discharge Instructions for Patients Receiving Chemotherapy  Today you received the following chemotherapy agents: Oxaliplatin, Leucovorin and Adrucil.   To help prevent nausea and vomiting after your treatment, we encourage you to take your nausea medication as directed.  If you develop nausea and vomiting that is not controlled by your nausea medication, call the clinic.   BELOW ARE SYMPTOMS THAT SHOULD BE REPORTED IMMEDIATELY:  *FEVER GREATER THAN 100.5 F  *CHILLS WITH OR WITHOUT FEVER  NAUSEA AND VOMITING THAT IS NOT CONTROLLED WITH YOUR NAUSEA MEDICATION  *UNUSUAL SHORTNESS OF BREATH  *UNUSUAL BRUISING OR BLEEDING  TENDERNESS IN MOUTH AND THROAT WITH OR WITHOUT PRESENCE OF ULCERS  *URINARY PROBLEMS  *BOWEL PROBLEMS  UNUSUAL RASH Items with * indicate a potential emergency and should be followed up as soon as possible.  Feel free to call the clinic you have any questions or concerns. The clinic phone number is [phone].  Please show the CHEMO ALERT CARD at check-in to the Emergency Department and triage nurse.   

## 2016-12-11 NOTE — Telephone Encounter (Signed)
Gave patient avs report and appointments for May and June.  °

## 2016-12-13 ENCOUNTER — Ambulatory Visit (HOSPITAL_BASED_OUTPATIENT_CLINIC_OR_DEPARTMENT_OTHER): Payer: Medicare Other

## 2016-12-13 VITALS — BP 121/67 | HR 98 | Temp 98.4°F | Resp 18

## 2016-12-13 DIAGNOSIS — C155 Malignant neoplasm of lower third of esophagus: Secondary | ICD-10-CM

## 2016-12-13 DIAGNOSIS — Z452 Encounter for adjustment and management of vascular access device: Secondary | ICD-10-CM | POA: Diagnosis not present

## 2016-12-13 MED ORDER — SODIUM CHLORIDE 0.9% FLUSH
10.0000 mL | INTRAVENOUS | Status: DC | PRN
Start: 2016-12-13 — End: 2016-12-13
  Administered 2016-12-13: 10 mL
  Filled 2016-12-13: qty 10

## 2016-12-13 MED ORDER — HEPARIN SOD (PORK) LOCK FLUSH 100 UNIT/ML IV SOLN
500.0000 [IU] | Freq: Once | INTRAVENOUS | Status: AC | PRN
  Administered 2016-12-13: 500 [IU]
  Filled 2016-12-13: qty 5

## 2016-12-18 ENCOUNTER — Other Ambulatory Visit: Payer: Self-pay | Admitting: Oncology

## 2016-12-25 ENCOUNTER — Ambulatory Visit: Payer: Medicare Other

## 2016-12-25 ENCOUNTER — Other Ambulatory Visit (HOSPITAL_BASED_OUTPATIENT_CLINIC_OR_DEPARTMENT_OTHER): Payer: Medicare Other

## 2016-12-25 ENCOUNTER — Ambulatory Visit (HOSPITAL_BASED_OUTPATIENT_CLINIC_OR_DEPARTMENT_OTHER): Payer: Medicare Other | Admitting: Oncology

## 2016-12-25 ENCOUNTER — Ambulatory Visit (HOSPITAL_BASED_OUTPATIENT_CLINIC_OR_DEPARTMENT_OTHER): Payer: Medicare Other

## 2016-12-25 VITALS — BP 141/77 | HR 80 | Temp 97.9°F | Resp 18 | Ht 70.0 in | Wt 190.0 lb

## 2016-12-25 DIAGNOSIS — Z5111 Encounter for antineoplastic chemotherapy: Secondary | ICD-10-CM | POA: Diagnosis not present

## 2016-12-25 DIAGNOSIS — Z95828 Presence of other vascular implants and grafts: Secondary | ICD-10-CM

## 2016-12-25 DIAGNOSIS — R040 Epistaxis: Secondary | ICD-10-CM | POA: Diagnosis not present

## 2016-12-25 DIAGNOSIS — I10 Essential (primary) hypertension: Secondary | ICD-10-CM | POA: Diagnosis not present

## 2016-12-25 DIAGNOSIS — J449 Chronic obstructive pulmonary disease, unspecified: Secondary | ICD-10-CM

## 2016-12-25 DIAGNOSIS — C155 Malignant neoplasm of lower third of esophagus: Secondary | ICD-10-CM

## 2016-12-25 LAB — CBC WITH DIFFERENTIAL/PLATELET
BASO%: 0.8 % (ref 0.0–2.0)
BASOS ABS: 0.1 10*3/uL (ref 0.0–0.1)
EOS ABS: 0.1 10*3/uL (ref 0.0–0.5)
EOS%: 1.1 % (ref 0.0–7.0)
HCT: 43.7 % (ref 38.4–49.9)
HGB: 14.8 g/dL (ref 13.0–17.1)
LYMPH%: 20.8 % (ref 14.0–49.0)
MCH: 31.9 pg (ref 27.2–33.4)
MCHC: 33.9 g/dL (ref 32.0–36.0)
MCV: 94 fL (ref 79.3–98.0)
MONO#: 1.2 10*3/uL — ABNORMAL HIGH (ref 0.1–0.9)
MONO%: 16.4 % — ABNORMAL HIGH (ref 0.0–14.0)
NEUT#: 4.3 10*3/uL (ref 1.5–6.5)
NEUT%: 60.9 % (ref 39.0–75.0)
Platelets: 107 10*3/uL — ABNORMAL LOW (ref 140–400)
RBC: 4.65 10*6/uL (ref 4.20–5.82)
RDW: 16.6 % — ABNORMAL HIGH (ref 11.0–14.6)
WBC: 7.1 10*3/uL (ref 4.0–10.3)
lymph#: 1.5 10*3/uL (ref 0.9–3.3)

## 2016-12-25 LAB — COMPREHENSIVE METABOLIC PANEL
ALT: 25 U/L (ref 0–55)
ANION GAP: 12 meq/L — AB (ref 3–11)
AST: 23 U/L (ref 5–34)
Albumin: 3.6 g/dL (ref 3.5–5.0)
Alkaline Phosphatase: 108 U/L (ref 40–150)
BUN: 9.4 mg/dL (ref 7.0–26.0)
CO2: 20 meq/L — AB (ref 22–29)
Calcium: 9.3 mg/dL (ref 8.4–10.4)
Chloride: 108 mEq/L (ref 98–109)
Creatinine: 0.8 mg/dL (ref 0.7–1.3)
GLUCOSE: 156 mg/dL — AB (ref 70–140)
POTASSIUM: 3.5 meq/L (ref 3.5–5.1)
SODIUM: 141 meq/L (ref 136–145)
Total Bilirubin: 0.44 mg/dL (ref 0.20–1.20)
Total Protein: 7.2 g/dL (ref 6.4–8.3)

## 2016-12-25 MED ORDER — DEXAMETHASONE SODIUM PHOSPHATE 10 MG/ML IJ SOLN
10.0000 mg | Freq: Once | INTRAMUSCULAR | Status: AC
  Administered 2016-12-25: 10 mg via INTRAVENOUS

## 2016-12-25 MED ORDER — DEXTROSE 5 % IV SOLN
400.0000 mg/m2 | Freq: Once | INTRAVENOUS | Status: AC
  Administered 2016-12-25: 832 mg via INTRAVENOUS
  Filled 2016-12-25: qty 41.6

## 2016-12-25 MED ORDER — FLUOROURACIL CHEMO INJECTION 5 GM/100ML
2400.0000 mg/m2 | INTRAVENOUS | Status: DC
  Administered 2016-12-25: 5000 mg via INTRAVENOUS
  Filled 2016-12-25: qty 100

## 2016-12-25 MED ORDER — DEXTROSE 5 % IV SOLN
Freq: Once | INTRAVENOUS | Status: AC
  Administered 2016-12-25: 09:00:00 via INTRAVENOUS

## 2016-12-25 MED ORDER — PALONOSETRON HCL INJECTION 0.25 MG/5ML
INTRAVENOUS | Status: AC
  Filled 2016-12-25: qty 5

## 2016-12-25 MED ORDER — PALONOSETRON HCL INJECTION 0.25 MG/5ML
0.2500 mg | Freq: Once | INTRAVENOUS | Status: AC
  Administered 2016-12-25: 0.25 mg via INTRAVENOUS

## 2016-12-25 MED ORDER — SODIUM CHLORIDE 0.9% FLUSH
10.0000 mL | INTRAVENOUS | Status: AC | PRN
  Administered 2016-12-25: 10 mL via INTRAVENOUS
  Filled 2016-12-25: qty 10

## 2016-12-25 MED ORDER — DEXAMETHASONE SODIUM PHOSPHATE 10 MG/ML IJ SOLN
INTRAMUSCULAR | Status: AC
  Filled 2016-12-25: qty 1

## 2016-12-25 MED ORDER — FLUOROURACIL CHEMO INJECTION 2.5 GM/50ML
400.0000 mg/m2 | Freq: Once | INTRAVENOUS | Status: AC
Start: 2016-12-25 — End: 2016-12-25
  Administered 2016-12-25: 850 mg via INTRAVENOUS
  Filled 2016-12-25: qty 17

## 2016-12-25 MED ORDER — OXALIPLATIN CHEMO INJECTION 100 MG/20ML
85.0000 mg/m2 | Freq: Once | INTRAVENOUS | Status: AC
  Administered 2016-12-25: 175 mg via INTRAVENOUS
  Filled 2016-12-25: qty 35

## 2016-12-25 NOTE — Progress Notes (Signed)
  [name] OFFICE PROGRESS NOTE   Diagnosis: Esophagus cancer  INTERVAL HISTORY:   Justin [name] returns as scheduled. He completed another cycle of FOLFOX 12/11/2016. He denies nausea/vomiting, mouth sores, and diarrhea. Cold sensitivity last 3-4 days following chemotherapy. No neuropathy symptoms at present. He had an episode of right-sided nose bleeding.   Objective:  Vital signs in last 24 hours:  Blood pressure (!) 141/77, pulse 80, temperature 97.9 F (36.6 C), temperature source Oral, resp. rate 18, height 5' 10" (1.778 m), weight 190 lb (86.2 kg), SpO2 96 %.    HEENT: No thrush or ulcers Resp: Distant breath sounds, scattered wheeze, no respiratory distress Cardio: Regular rate and rhythm GI: No hepatosplenomegaly Vascular: No leg edema Neuro: Moderate loss of vibratory sense at the fingertips bilaterally     Portacath/PICC-without erythema  Lab Results:  Lab Results  Component Value Date   WBC 7.1 12/25/2016   HGB 14.8 12/25/2016   HCT 43.7 12/25/2016   MCV 94.0 12/25/2016   PLT 107 (L) 12/25/2016   NEUTROABS 4.3 12/25/2016    CMP     Component Value Date/Time   NA 139 12/11/2016 0839   K 3.8 12/11/2016 0839   CL 104 08/30/2016 1158   CO2 22 12/11/2016 0839   GLUCOSE 156 (H) 12/11/2016 0839   BUN 15.1 12/11/2016 0839   CREATININE 0.8 12/11/2016 0839   CALCIUM 9.4 12/11/2016 0839   PROT 7.4 12/11/2016 0839   ALBUMIN 3.7 12/11/2016 0839   AST 23 12/11/2016 0839   ALT 27 12/11/2016 0839   ALKPHOS 109 12/11/2016 0839   BILITOT 0.43 12/11/2016 0839   GFRNONAA >60 08/30/2016 1158   GFRAA >60 08/30/2016 1158    Lab Results  Component Value Date   CEA1 3.63 12/11/2016    Medications: I have reviewed the patient's current medications.  Assessment/Plan: 1. 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 Staging CTs 08/18/2016 with nonspecific pulmonary nodules, liver metastases,  borderline thoracic and gastrohepatic ligament lymphadenopathy ? Cycle 1 FOLFOX 09/04/2016 ? Cycle 2 FOLFOX 09/18/2016 ? Cycle 3 FOLFOX 10/02/2016 ? Cycle 4 FOLFOX 10/16/2016 ? Cycle 5 FOLFOX 10/30/2016 ? Restaging CTs 11/10/2016-decreased wall thickness distal esophagus; stable borderline mediastinal lymphadenopathy; multiple liver metastases have completely resolved; stable scattered nonspecific tiny bilateral lung nodules ? Cycle 6 FOLFOX 11/13/2016 ? Cycle 7 FOLFOX 11/27/2016 ? Cycle 8 FOLFOX 12/11/2016 ? Cycle 9 FOLFOX 12/25/2016 2. Diabetes  3. COPD  4. Hypertension  5. History of colon polyps  6. Port-A-Cath placement 08/30/2016  7. 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   Disposition:  Justin [name] appears unchanged. He will complete cycle 9 FOLFOX today. I doubt the right-sided nose bleed was related to chemotherapy. He will contact us for recurrent bleeding. Justin [name] will return for an office visit in 2 weeks.  The plan is to switch to maintenance 5 fluorouracil or Xeloda after this cycle of chemotherapy.  15 minutes were spent with the patient today. The majority of the time was used for counseling and coordination of care.  Donneta Romberg, MD  12/25/2016  8:38 AM

## 2016-12-25 NOTE — Patient Instructions (Signed)
[facility] Discharge Instructions for Patients Receiving Chemotherapy  Today you received the following chemotherapy agents: Oxaliplatin, Leucovorin and Adrucil.  To help prevent nausea and vomiting after your treatment, we encourage you to take your nausea medication as directed. No Zofran for 3 days. Take Compazine instead.    If you develop nausea and vomiting that is not controlled by your nausea medication, call the clinic.   BELOW ARE SYMPTOMS THAT SHOULD BE REPORTED IMMEDIATELY:  *FEVER GREATER THAN 100.5 F  *CHILLS WITH OR WITHOUT FEVER  NAUSEA AND VOMITING THAT IS NOT CONTROLLED WITH YOUR NAUSEA MEDICATION  *UNUSUAL SHORTNESS OF BREATH  *UNUSUAL BRUISING OR BLEEDING  TENDERNESS IN MOUTH AND THROAT WITH OR WITHOUT PRESENCE OF ULCERS  *URINARY PROBLEMS  *BOWEL PROBLEMS  UNUSUAL RASH Items with * indicate a potential emergency and should be followed up as soon as possible.  Feel free to call the clinic you have any questions or concerns. The clinic phone number is [phone].  Please show the CHEMO ALERT CARD at check-in to the Emergency Department and triage nurse.   

## 2016-12-25 NOTE — Patient Instructions (Signed)
Implanted Port Home Guide An implanted port is a type of central line that is placed under the skin. Central lines are used to provide IV access when treatment or nutrition needs to be given through a person's veins. Implanted ports are used for long-term IV access. An implanted port may be placed because:  You need IV medicine that would be irritating to the small veins in your hands or arms.  You need long-term IV medicines, such as antibiotics.  You need IV nutrition for a long period.  You need frequent blood draws for lab tests.  You need dialysis.  Implanted ports are usually placed in the chest area, but they can also be placed in the upper arm, the abdomen, or the leg. An implanted port has two main parts:  Reservoir. The reservoir is round and will appear as a small, raised area under your skin. The reservoir is the part where a needle is inserted to give medicines or draw blood.  Catheter. The catheter is a thin, flexible tube that extends from the reservoir. The catheter is placed into a large vein. Medicine that is inserted into the reservoir goes into the catheter and then into the vein.  How will I care for my incision site? Do not get the incision site wet. Bathe or shower as directed by your health care provider. How is my port accessed? Special steps must be taken to access the port:  Before the port is accessed, a numbing cream can be placed on the skin. This helps numb the skin over the port site.  Your health care provider uses a sterile technique to access the port. ? Your health care provider must put on a mask and sterile gloves. ? The skin over your port is cleaned carefully with an antiseptic and allowed to dry. ? The port is gently pinched between sterile gloves, and a needle is inserted into the port.  Only "non-coring" port needles should be used to access the port. Once the port is accessed, a blood return should be checked. This helps ensure that the port  is in the vein and is not clogged.  If your port needs to remain accessed for a constant infusion, a clear (transparent) bandage will be placed over the needle site. The bandage and needle will need to be changed every week, or as directed by your health care provider.  Keep the bandage covering the needle clean and dry. Do not get it wet. Follow your health care provider's instructions on how to take a shower or bath while the port is accessed.  If your port does not need to stay accessed, no bandage is needed over the port.  What is flushing? Flushing helps keep the port from getting clogged. Follow your health care provider's instructions on how and when to flush the port. Ports are usually flushed with saline solution or a medicine called heparin. The need for flushing will depend on how the port is used.  If the port is used for intermittent medicines or blood draws, the port will need to be flushed: ? After medicines have been given. ? After blood has been drawn. ? As part of routine maintenance.  If a constant infusion is running, the port may not need to be flushed.  How long will my port stay implanted? The port can stay in for as long as your health care provider thinks it is needed. When it is time for the port to come out, surgery will be   done to remove it. The procedure is similar to the one performed when the port was put in. When should I seek immediate medical care? When you have an implanted port, you should seek immediate medical care if:  You notice a bad smell coming from the incision site.  You have swelling, redness, or drainage at the incision site.  You have more swelling or pain at the port site or the surrounding area.  You have a fever that is not controlled with medicine.  This information is not intended to replace advice given to you by your health care provider. Make sure you discuss any questions you have with your health care provider. Document  Released: 07/10/2005 Document Revised: 12/16/2015 Document Reviewed: 03/17/2013 Elsevier Interactive Patient Education  2017 [employer]  

## 2016-12-27 ENCOUNTER — Ambulatory Visit (HOSPITAL_BASED_OUTPATIENT_CLINIC_OR_DEPARTMENT_OTHER): Payer: Medicare Other

## 2016-12-27 VITALS — BP 123/68 | HR 92 | Temp 97.4°F | Resp 20

## 2016-12-27 DIAGNOSIS — C787 Secondary malignant neoplasm of liver and intrahepatic bile duct: Secondary | ICD-10-CM | POA: Diagnosis not present

## 2016-12-27 DIAGNOSIS — Z452 Encounter for adjustment and management of vascular access device: Secondary | ICD-10-CM | POA: Diagnosis not present

## 2016-12-27 DIAGNOSIS — C155 Malignant neoplasm of lower third of esophagus: Secondary | ICD-10-CM | POA: Diagnosis not present

## 2016-12-27 MED ORDER — HEPARIN SOD (PORK) LOCK FLUSH 100 UNIT/ML IV SOLN
500.0000 [IU] | Freq: Once | INTRAVENOUS | Status: AC | PRN
  Administered 2016-12-27: 500 [IU]
  Filled 2016-12-27: qty 5

## 2016-12-27 MED ORDER — SODIUM CHLORIDE 0.9% FLUSH
3.0000 mL | INTRAVENOUS | Status: DC | PRN
  Administered 2016-12-27: 3 mL via INTRAVENOUS
  Filled 2016-12-27: qty 10

## 2016-12-27 NOTE — Patient Instructions (Signed)
Implanted Port Home Guide An implanted port is a type of central line that is placed under the skin. Central lines are used to provide IV access when treatment or nutrition needs to be given through a person's veins. Implanted ports are used for long-term IV access. An implanted port may be placed because:  You need IV medicine that would be irritating to the small veins in your hands or arms.  You need long-term IV medicines, such as antibiotics.  You need IV nutrition for a long period.  You need frequent blood draws for lab tests.  You need dialysis.  Implanted ports are usually placed in the chest area, but they can also be placed in the upper arm, the abdomen, or the leg. An implanted port has two main parts:  Reservoir. The reservoir is round and will appear as a small, raised area under your skin. The reservoir is the part where a needle is inserted to give medicines or draw blood.  Catheter. The catheter is a thin, flexible tube that extends from the reservoir. The catheter is placed into a large vein. Medicine that is inserted into the reservoir goes into the catheter and then into the vein.  How will I care for my incision site? Do not get the incision site wet. Bathe or shower as directed by your health care provider. How is my port accessed? Special steps must be taken to access the port:  Before the port is accessed, a numbing cream can be placed on the skin. This helps numb the skin over the port site.  Your health care provider uses a sterile technique to access the port. ? Your health care provider must put on a mask and sterile gloves. ? The skin over your port is cleaned carefully with an antiseptic and allowed to dry. ? The port is gently pinched between sterile gloves, and a needle is inserted into the port.  Only "non-coring" port needles should be used to access the port. Once the port is accessed, a blood return should be checked. This helps ensure that the port  is in the vein and is not clogged.  If your port needs to remain accessed for a constant infusion, a clear (transparent) bandage will be placed over the needle site. The bandage and needle will need to be changed every week, or as directed by your health care provider.  Keep the bandage covering the needle clean and dry. Do not get it wet. Follow your health care provider's instructions on how to take a shower or bath while the port is accessed.  If your port does not need to stay accessed, no bandage is needed over the port.  What is flushing? Flushing helps keep the port from getting clogged. Follow your health care provider's instructions on how and when to flush the port. Ports are usually flushed with saline solution or a medicine called heparin. The need for flushing will depend on how the port is used.  If the port is used for intermittent medicines or blood draws, the port will need to be flushed: ? After medicines have been given. ? After blood has been drawn. ? As part of routine maintenance.  If a constant infusion is running, the port may not need to be flushed.  How long will my port stay implanted? The port can stay in for as long as your health care provider thinks it is needed. When it is time for the port to come out, surgery will be   done to remove it. The procedure is similar to the one performed when the port was put in. When should I seek immediate medical care? When you have an implanted port, you should seek immediate medical care if:  You notice a bad smell coming from the incision site.  You have swelling, redness, or drainage at the incision site.  You have more swelling or pain at the port site or the surrounding area.  You have a fever that is not controlled with medicine.  This information is not intended to replace advice given to you by your health care provider. Make sure you discuss any questions you have with your health care provider. Document  Released: 07/10/2005 Document Revised: 12/16/2015 Document Reviewed: 03/17/2013 Elsevier Interactive Patient Education  2017 [employer]  

## 2017-01-02 DIAGNOSIS — C155 Malignant neoplasm of lower third of esophagus: Secondary | ICD-10-CM | POA: Diagnosis not present

## 2017-01-07 ENCOUNTER — Other Ambulatory Visit: Payer: Self-pay | Admitting: Oncology

## 2017-01-08 ENCOUNTER — Ambulatory Visit (HOSPITAL_BASED_OUTPATIENT_CLINIC_OR_DEPARTMENT_OTHER): Payer: Medicare Other

## 2017-01-08 ENCOUNTER — Ambulatory Visit (HOSPITAL_BASED_OUTPATIENT_CLINIC_OR_DEPARTMENT_OTHER): Payer: Medicare Other | Admitting: Oncology

## 2017-01-08 ENCOUNTER — Other Ambulatory Visit (HOSPITAL_BASED_OUTPATIENT_CLINIC_OR_DEPARTMENT_OTHER): Payer: Medicare Other

## 2017-01-08 ENCOUNTER — Ambulatory Visit: Payer: Medicare Other

## 2017-01-08 ENCOUNTER — Telehealth: Payer: Self-pay | Admitting: Oncology

## 2017-01-08 VITALS — BP 147/79 | HR 82 | Temp 97.9°F | Resp 18 | Ht 70.0 in | Wt 191.2 lb

## 2017-01-08 DIAGNOSIS — I10 Essential (primary) hypertension: Secondary | ICD-10-CM

## 2017-01-08 DIAGNOSIS — C155 Malignant neoplasm of lower third of esophagus: Secondary | ICD-10-CM

## 2017-01-08 DIAGNOSIS — E119 Type 2 diabetes mellitus without complications: Secondary | ICD-10-CM | POA: Diagnosis not present

## 2017-01-08 DIAGNOSIS — Z95828 Presence of other vascular implants and grafts: Secondary | ICD-10-CM

## 2017-01-08 DIAGNOSIS — Z5111 Encounter for antineoplastic chemotherapy: Secondary | ICD-10-CM | POA: Diagnosis not present

## 2017-01-08 LAB — COMPREHENSIVE METABOLIC PANEL
ALT: 27 U/L (ref 0–55)
AST: 27 U/L (ref 5–34)
Albumin: 3.6 g/dL (ref 3.5–5.0)
Alkaline Phosphatase: 110 U/L (ref 40–150)
Anion Gap: 9 mEq/L (ref 3–11)
BILIRUBIN TOTAL: 0.43 mg/dL (ref 0.20–1.20)
BUN: 8.7 mg/dL (ref 7.0–26.0)
CALCIUM: 9.4 mg/dL (ref 8.4–10.4)
CO2: 20 mEq/L — ABNORMAL LOW (ref 22–29)
CREATININE: 0.8 mg/dL (ref 0.7–1.3)
Chloride: 109 mEq/L (ref 98–109)
EGFR: >90 mL/min/{1.73_m2} (ref >90)
Glucose: 139 mg/dl (ref 70–140)
Potassium: 3.5 mEq/L (ref 3.5–5.1)
Sodium: 139 mEq/L (ref 136–145)
TOTAL PROTEIN: 7.2 g/dL (ref 6.4–8.3)

## 2017-01-08 LAB — CBC WITH DIFFERENTIAL/PLATELET
BASO%: 0.4 % (ref 0.0–2.0)
Basophils Absolute: 0 10*3/uL (ref 0.0–0.1)
EOS%: 1.1 % (ref 0.0–7.0)
Eosinophils Absolute: 0.1 10*3/uL (ref 0.0–0.5)
HEMATOCRIT: 43.4 % (ref 38.4–49.9)
HGB: 14.5 g/dL (ref 13.0–17.1)
LYMPH#: 1.5 10*3/uL (ref 0.9–3.3)
LYMPH%: 20.1 % (ref 14.0–49.0)
MCH: 31.5 pg (ref 27.2–33.4)
MCHC: 33.4 g/dL (ref 32.0–36.0)
MCV: 94.3 fL (ref 79.3–98.0)
MONO#: 1.4 10*3/uL — AB (ref 0.1–0.9)
MONO%: 18.8 % — ABNORMAL HIGH (ref 0.0–14.0)
NEUT%: 59.6 % (ref 39.0–75.0)
NEUTROS ABS: 4.3 10*3/uL (ref 1.5–6.5)
PLATELETS: 72 10*3/uL — AB (ref 140–400)
RBC: 4.6 10*6/uL (ref 4.20–5.82)
RDW: 15.4 % — ABNORMAL HIGH (ref 11.0–14.6)
WBC: 7.3 10*3/uL (ref 4.0–10.3)

## 2017-01-08 LAB — CEA (IN HOUSE-CHCC): CEA (CHCC-IN HOUSE): 3.9 ng/mL (ref 0.00–5.00)

## 2017-01-08 MED ORDER — FLUOROURACIL CHEMO INJECTION 2.5 GM/50ML
400.0000 mg/m2 | Freq: Once | INTRAVENOUS | Status: AC
  Administered 2017-01-08: 850 mg via INTRAVENOUS
  Filled 2017-01-08: qty 17

## 2017-01-08 MED ORDER — LEUCOVORIN CALCIUM INJECTION 350 MG
400.0000 mg/m2 | Freq: Once | INTRAVENOUS | Status: AC
  Administered 2017-01-08: 832 mg via INTRAVENOUS
  Filled 2017-01-08: qty 41.6

## 2017-01-08 MED ORDER — SODIUM CHLORIDE 0.9% FLUSH
10.0000 mL | INTRAVENOUS | Status: DC | PRN
  Administered 2017-01-08: 10 mL via INTRAVENOUS
  Filled 2017-01-08: qty 10

## 2017-01-08 MED ORDER — SODIUM CHLORIDE 0.9 % IV SOLN
2400.0000 mg/m2 | INTRAVENOUS | Status: DC
  Administered 2017-01-08: 5000 mg via INTRAVENOUS
  Filled 2017-01-08: qty 100

## 2017-01-08 NOTE — Telephone Encounter (Signed)
Scheduled appt per 6/18 los - Gave patient AVS and calender.  

## 2017-01-08 NOTE — Progress Notes (Signed)
  [place] OFFICE PROGRESS NOTE   Diagnosis: Esophagus cancer  INTERVAL HISTORY:   Justin [name] returns as scheduled. He completed another cycle of FOLFOX 12/25/2016. He tolerated chemotherapy well. In the extremities. This does not interfere with activity. No other complaint. He is attempting to stop smoking.  Objective:  Vital signs in last 24 hours:  Blood pressure (!) 147/79, pulse 82, temperature 97.9 F (36.6 C), temperature source Oral, resp. rate 18, height 5' 10" (1.778 m), weight 191 lb 3.2 oz (86.7 kg), SpO2 97 %.    HEENT: No thrush or ulcers Resp: Scattered rhonchi, no respiratory distress Cardio: Regular rate and rhythm GI: No hepatosplenomegaly, nontender Vascular: No leg edema Neuro: Moderate loss of vibratory sense at the fingertips bilaterally  Skin: Mild hyperpigmentation and skin thickening of the palms   Portacath/PICC-without erythema  Lab Results:  Lab Results  Component Value Date   WBC 7.3 01/08/2017   HGB 14.5 01/08/2017   HCT 43.4 01/08/2017   MCV 94.3 01/08/2017   PLT 72 (L) 01/08/2017   NEUTROABS 4.3 01/08/2017    CMP     Component Value Date/Time   NA 139 01/08/2017 0813   K 3.5 01/08/2017 0813   CL 104 08/30/2016 1158   CO2 20 (L) 01/08/2017 0813   GLUCOSE 139 01/08/2017 0813   BUN 8.7 01/08/2017 0813   CREATININE 0.8 01/08/2017 0813   CALCIUM 9.4 01/08/2017 0813   PROT 7.2 01/08/2017 0813   ALBUMIN 3.6 01/08/2017 0813   AST 27 01/08/2017 0813   ALT 27 01/08/2017 0813   ALKPHOS 110 01/08/2017 0813   BILITOT 0.43 01/08/2017 0813   GFRNONAA >60 08/30/2016 1158   GFRAA >60 08/30/2016 1158    Lab Results  Component Value Date   CEA1 3.63 12/11/2016     Medications: I have reviewed the patient's current medications.  Assessment/Plan: 1. 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 Staging CTs 08/18/2016 with nonspecific pulmonary nodules, liver  metastases, borderline thoracic and gastrohepatic ligament lymphadenopathy ? Cycle 1 FOLFOX 09/04/2016 ? Cycle 2 FOLFOX 09/18/2016 ? Cycle 3 FOLFOX 10/02/2016 ? Cycle 4 FOLFOX 10/16/2016 ? Cycle 5 FOLFOX 10/30/2016 ? Restaging CTs 11/10/2016-decreased wall thickness distal esophagus; stable borderline mediastinal lymphadenopathy; multiple liver metastases have completely resolved; stable scattered nonspecific tiny bilateral lung nodules ? Cycle 6 FOLFOX 11/13/2016 ? Cycle 7 FOLFOX 11/27/2016 ? Cycle 8 FOLFOX 12/11/2016 ? Cycle 9 FOLFOX 12/25/2016 ? Cycle 10 FOLFOX (oxaliplatin deleted) 01/08/2017 2. Diabetes  3. COPD  4. Hypertension  5. History of colon polyps  6. Port-A-Cath placement 08/30/2016  7. 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     Disposition:  Justin [name] has completed 9 cycles of FOLFOX. There has been clinical and x-ray in improvement of the metastatic esophagus cancer. We decided to discontinue oxaliplatin with the final cycle of FOLFOX today.  We discussed observation versus maintenance Xeloda. He agrees to maintenance Xeloda. The plan is to start Xeloda on 01/29/2017. We reviewed the potential toxicities associated with Xeloda including the chance of hand/foot syndrome, mucositis, diarrhea, and increased sun sensitivity.  Justin [name] will return for an office visit on 01/26/2017.  25 minutes were spent with the patient today. The majority of time was used for counseling and coordination of care.  Donneta Romberg, MD  01/08/2017  9:49 AM

## 2017-01-10 ENCOUNTER — Ambulatory Visit (HOSPITAL_BASED_OUTPATIENT_CLINIC_OR_DEPARTMENT_OTHER): Payer: Medicare Other

## 2017-01-10 VITALS — BP 129/87 | HR 110 | Temp 97.7°F | Resp 18

## 2017-01-10 DIAGNOSIS — Z452 Encounter for adjustment and management of vascular access device: Secondary | ICD-10-CM | POA: Diagnosis not present

## 2017-01-10 DIAGNOSIS — C155 Malignant neoplasm of lower third of esophagus: Secondary | ICD-10-CM | POA: Diagnosis not present

## 2017-01-10 MED ORDER — SODIUM CHLORIDE 0.9% FLUSH
10.0000 mL | INTRAVENOUS | Status: DC | PRN
  Administered 2017-01-10: 10 mL
  Filled 2017-01-10: qty 10

## 2017-01-10 MED ORDER — HEPARIN SOD (PORK) LOCK FLUSH 100 UNIT/ML IV SOLN
500.0000 [IU] | Freq: Once | INTRAVENOUS | Status: AC | PRN
  Administered 2017-01-10: 500 [IU]
  Filled 2017-01-10: qty 5

## 2017-01-10 NOTE — Patient Instructions (Signed)
Implanted Port Home Guide An implanted port is a type of central line that is placed under the skin. Central lines are used to provide IV access when treatment or nutrition needs to be given through a person's veins. Implanted ports are used for long-term IV access. An implanted port may be placed because:  You need IV medicine that would be irritating to the small veins in your hands or arms.  You need long-term IV medicines, such as antibiotics.  You need IV nutrition for a long period.  You need frequent blood draws for lab tests.  You need dialysis.  Implanted ports are usually placed in the chest area, but they can also be placed in the upper arm, the abdomen, or the leg. An implanted port has two main parts:  Reservoir. The reservoir is round and will appear as a small, raised area under your skin. The reservoir is the part where a needle is inserted to give medicines or draw blood.  Catheter. The catheter is a thin, flexible tube that extends from the reservoir. The catheter is placed into a large vein. Medicine that is inserted into the reservoir goes into the catheter and then into the vein.  How will I care for my incision site? Do not get the incision site wet. Bathe or shower as directed by your health care provider. How is my port accessed? Special steps must be taken to access the port:  Before the port is accessed, a numbing cream can be placed on the skin. This helps numb the skin over the port site.  Your health care provider uses a sterile technique to access the port. ? Your health care provider must put on a mask and sterile gloves. ? The skin over your port is cleaned carefully with an antiseptic and allowed to dry. ? The port is gently pinched between sterile gloves, and a needle is inserted into the port.  Only "non-coring" port needles should be used to access the port. Once the port is accessed, a blood return should be checked. This helps ensure that the port  is in the vein and is not clogged.  If your port needs to remain accessed for a constant infusion, a clear (transparent) bandage will be placed over the needle site. The bandage and needle will need to be changed every week, or as directed by your health care provider.  Keep the bandage covering the needle clean and dry. Do not get it wet. Follow your health care provider's instructions on how to take a shower or bath while the port is accessed.  If your port does not need to stay accessed, no bandage is needed over the port.  What is flushing? Flushing helps keep the port from getting clogged. Follow your health care provider's instructions on how and when to flush the port. Ports are usually flushed with saline solution or a medicine called heparin. The need for flushing will depend on how the port is used.  If the port is used for intermittent medicines or blood draws, the port will need to be flushed: ? After medicines have been given. ? After blood has been drawn. ? As part of routine maintenance.  If a constant infusion is running, the port may not need to be flushed.  How long will my port stay implanted? The port can stay in for as long as your health care provider thinks it is needed. When it is time for the port to come out, surgery will be   done to remove it. The procedure is similar to the one performed when the port was put in. When should I seek immediate medical care? When you have an implanted port, you should seek immediate medical care if:  You notice a bad smell coming from the incision site.  You have swelling, redness, or drainage at the incision site.  You have more swelling or pain at the port site or the surrounding area.  You have a fever that is not controlled with medicine.  This information is not intended to replace advice given to you by your health care provider. Make sure you discuss any questions you have with your health care provider. Document  Released: 07/10/2005 Document Revised: 12/16/2015 Document Reviewed: 03/17/2013 Elsevier Interactive Patient Education  2017 [employer]  

## 2017-01-11 ENCOUNTER — Telehealth: Payer: Self-pay | Admitting: Pharmacist

## 2017-01-11 DIAGNOSIS — C155 Malignant neoplasm of lower third of esophagus: Secondary | ICD-10-CM

## 2017-01-11 MED ORDER — CAPECITABINE 500 MG PO TABS: 1500.0000 mg | ORAL_TABLET | Freq: Two times a day (BID) | ORAL | 0 refills | Status: DC

## 2017-01-11 NOTE — Telephone Encounter (Signed)
Oral Chemotherapy Pharmacist Encounter  Received new prescription for Xeloda for the maintenance treatment of metastatic esophageal cancer. Noted planned start date 01/29/17.  Labs from 01/08/17 reviewed, noted pltc=72k, OK for treatment  Current medication list in Epic assessed, no DDIs with Xeloda identified.  Prescription has been e-scribed to the [facility] for benefits investigation and approval.  Oral Oncology Clinic will continue to follow for prior authorization, copayment issues, and initial counseling.  Thank you,  Johny Drilling, PharmD, BCPS, BCOP 01/11/2017  3:21 PM Oral Oncology Clinic [phone]

## 2017-01-15 NOTE — Telephone Encounter (Signed)
Received notification that patient's copayment was $331.15 monthly which is cost prohibitive to the patient.  We have exhausted all options for copayment assistance or manufacturer assistance.   MD will be informed.    Oral Oncology clinic will continue to follow.   Justin [name]. [facility] Oral Oncology Patient Advocate 01/15/2017 11:29 AM

## 2017-01-16 NOTE — Telephone Encounter (Signed)
Need to schedule for folfox without oxaliplatin or can come to office visit as scheduled and discuss options

## 2017-01-26 ENCOUNTER — Ambulatory Visit: Payer: Medicare Other

## 2017-01-26 ENCOUNTER — Other Ambulatory Visit (HOSPITAL_BASED_OUTPATIENT_CLINIC_OR_DEPARTMENT_OTHER): Payer: Medicare Other

## 2017-01-26 ENCOUNTER — Ambulatory Visit (HOSPITAL_BASED_OUTPATIENT_CLINIC_OR_DEPARTMENT_OTHER): Payer: Medicare Other | Admitting: Oncology

## 2017-01-26 ENCOUNTER — Telehealth: Payer: Self-pay | Admitting: Oncology

## 2017-01-26 VITALS — BP 136/84 | HR 87 | Temp 97.8°F | Resp 18 | Ht 70.0 in | Wt 189.7 lb

## 2017-01-26 DIAGNOSIS — C787 Secondary malignant neoplasm of liver and intrahepatic bile duct: Secondary | ICD-10-CM

## 2017-01-26 DIAGNOSIS — I10 Essential (primary) hypertension: Secondary | ICD-10-CM

## 2017-01-26 DIAGNOSIS — C155 Malignant neoplasm of lower third of esophagus: Secondary | ICD-10-CM

## 2017-01-26 LAB — CBC WITH DIFFERENTIAL/PLATELET
BASO%: 0.3 % (ref 0.0–2.0)
Basophils Absolute: 0 10*3/uL (ref 0.0–0.1)
EOS ABS: 0.1 10*3/uL (ref 0.0–0.5)
EOS%: 0.6 % (ref 0.0–7.0)
HEMATOCRIT: 44.4 % (ref 38.4–49.9)
HGB: 15.1 g/dL (ref 13.0–17.1)
LYMPH#: 1.7 10*3/uL (ref 0.9–3.3)
LYMPH%: 15.2 % (ref 14.0–49.0)
MCH: 31.9 pg (ref 27.2–33.4)
MCHC: 34 g/dL (ref 32.0–36.0)
MCV: 93.9 fL (ref 79.3–98.0)
MONO#: 1.3 10*3/uL — AB (ref 0.1–0.9)
MONO%: 11.4 % (ref 0.0–14.0)
NEUT%: 72.5 % (ref 39.0–75.0)
NEUTROS ABS: 8.3 10*3/uL — AB (ref 1.5–6.5)
Platelets: 133 10*3/uL — ABNORMAL LOW (ref 140–400)
RBC: 4.73 10*6/uL (ref 4.20–5.82)
RDW: 14.9 % — ABNORMAL HIGH (ref 11.0–14.6)
WBC: 11.4 10*3/uL — AB (ref 4.0–10.3)

## 2017-01-26 LAB — COMPREHENSIVE METABOLIC PANEL
ALK PHOS: 113 U/L (ref 40–150)
ALT: 26 U/L (ref 0–55)
ANION GAP: 9 meq/L (ref 3–11)
AST: 24 U/L (ref 5–34)
Albumin: 3.8 g/dL (ref 3.5–5.0)
BILIRUBIN TOTAL: 0.47 mg/dL (ref 0.20–1.20)
BUN: 9.9 mg/dL (ref 7.0–26.0)
CALCIUM: 9.7 mg/dL (ref 8.4–10.4)
CO2: 20 mEq/L — ABNORMAL LOW (ref 22–29)
CREATININE: 0.8 mg/dL (ref 0.7–1.3)
Chloride: 107 mEq/L (ref 98–109)
Glucose: 155 mg/dl — ABNORMAL HIGH (ref 70–140)
Potassium: 3.8 mEq/L (ref 3.5–5.1)
Sodium: 136 mEq/L (ref 136–145)
TOTAL PROTEIN: 7.8 g/dL (ref 6.4–8.3)

## 2017-01-26 NOTE — Telephone Encounter (Signed)
Scheduled appt per 7/6 los - Gave patient AVS and calender per los.  

## 2017-01-26 NOTE — Progress Notes (Signed)
Received care of pt ambulatory from waiting room. Port accessed without difficulty and flushed with 40 mls of normal saline with no blood return noted. Attempted multiple position changes to gain blood return with no success. Erline Levine, RN (Camera operator) attempted to get blood return also with no success. Pt port flushed and deaccessed per policy and labs drawn from pt right arm AC without difficulty. PT aware of need for flush appointment to get blood return. Message left with Dr. Gearldine Shown RN to make appointment. Pt sent to waiting room in no apparent distress and aware of plan of care and had no further questions.

## 2017-01-26 NOTE — Telephone Encounter (Signed)
Oral Chemotherapy Pharmacist Encounter  Xeloda copayment prohibitively expensive for patient. Patient to change to IV chemotherapy.  No further needs from [facility] identified at this time. Oral Oncology Clinic will sign off. Please let us know if we can be of assistance in the future.  Johny Drilling, PharmD, BCPS, BCOP 01/26/2017  9:53 AM Oral Oncology Clinic [phone]

## 2017-01-26 NOTE — Patient Instructions (Signed)
Implanted Port Home Guide An implanted port is a type of central line that is placed under the skin. Central lines are used to provide IV access when treatment or nutrition needs to be given through a person's veins. Implanted ports are used for long-term IV access. An implanted port may be placed because:  You need IV medicine that would be irritating to the small veins in your hands or arms.  You need long-term IV medicines, such as antibiotics.  You need IV nutrition for a long period.  You need frequent blood draws for lab tests.  You need dialysis.  Implanted ports are usually placed in the chest area, but they can also be placed in the upper arm, the abdomen, or the leg. An implanted port has two main parts:  Reservoir. The reservoir is round and will appear as a small, raised area under your skin. The reservoir is the part where a needle is inserted to give medicines or draw blood.  Catheter. The catheter is a thin, flexible tube that extends from the reservoir. The catheter is placed into a large vein. Medicine that is inserted into the reservoir goes into the catheter and then into the vein.  How will I care for my incision site? Do not get the incision site wet. Bathe or shower as directed by your health care provider. How is my port accessed? Special steps must be taken to access the port:  Before the port is accessed, a numbing cream can be placed on the skin. This helps numb the skin over the port site.  Your health care provider uses a sterile technique to access the port. ? Your health care provider must put on a mask and sterile gloves. ? The skin over your port is cleaned carefully with an antiseptic and allowed to dry. ? The port is gently pinched between sterile gloves, and a needle is inserted into the port.  Only "non-coring" port needles should be used to access the port. Once the port is accessed, a blood return should be checked. This helps ensure that the port  is in the vein and is not clogged.  If your port needs to remain accessed for a constant infusion, a clear (transparent) bandage will be placed over the needle site. The bandage and needle will need to be changed every week, or as directed by your health care provider.  Keep the bandage covering the needle clean and dry. Do not get it wet. Follow your health care provider's instructions on how to take a shower or bath while the port is accessed.  If your port does not need to stay accessed, no bandage is needed over the port.  What is flushing? Flushing helps keep the port from getting clogged. Follow your health care provider's instructions on how and when to flush the port. Ports are usually flushed with saline solution or a medicine called heparin. The need for flushing will depend on how the port is used.  If the port is used for intermittent medicines or blood draws, the port will need to be flushed: ? After medicines have been given. ? After blood has been drawn. ? As part of routine maintenance.  If a constant infusion is running, the port may not need to be flushed.  How long will my port stay implanted? The port can stay in for as long as your health care provider thinks it is needed. When it is time for the port to come out, surgery will be   done to remove it. The procedure is similar to the one performed when the port was put in. When should I seek immediate medical care? When you have an implanted port, you should seek immediate medical care if:  You notice a bad smell coming from the incision site.  You have swelling, redness, or drainage at the incision site.  You have more swelling or pain at the port site or the surrounding area.  You have a fever that is not controlled with medicine.  This information is not intended to replace advice given to you by your health care provider. Make sure you discuss any questions you have with your health care provider. Document  Released: 07/10/2005 Document Revised: 12/16/2015 Document Reviewed: 03/17/2013 Elsevier Interactive Patient Education  2017 [employer]  

## 2017-01-26 NOTE — Progress Notes (Signed)
  [place] OFFICE PROGRESS NOTE   Diagnosis: Esophagus cancer  INTERVAL HISTORY:   [name] returns as scheduled. He completed another cycle of 5-fluorouracil 01/08/2017. He feels well. No dysphagia. Mild numbness in the fingers. No other complaint. He wants to return to work. He was prescribed Xeloda for maintenance therapy. His insurance company requires a high co-pay for the Xeloda. He cannot afford this medication.  Objective:  Vital signs in last 24 hours:  Blood pressure 136/84, pulse 87, temperature 97.8 F (36.6 C), temperature source Oral, resp. rate 18, height '5\' 10"'$  (1.778 m), weight 189 lb 11.2 oz (86 kg), SpO2 99 %.    HEENT: No thrush or ulcers Resp: Scattered bilateral wheezes/rhonchi, no respiratory distress Cardio: Regular rate and rhythm GI: No hepatomegaly, no mass, nontender Vascular: No leg edema Neuro: Moderate decrease in vibratory sense at the fingertips bilaterally  Skin: Skin thickening at the hands, no erythema   Portacath/PICC-without erythema  Lab Results:  Lab Results  Component Value Date   WBC 11.4 (H) 01/26/2017   HGB 15.1 01/26/2017   HCT 44.4 01/26/2017   MCV 93.9 01/26/2017   PLT 133 (L) 01/26/2017   NEUTROABS 8.3 (H) 01/26/2017      Lab Results  Component Value Date   CEA1 3.90 01/08/2017     Medications: I have reviewed the patient's current medications.  Assessment/Plan: 1. 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 Staging CTs 08/18/2016 with nonspecific pulmonary nodules, liver metastases, borderline thoracic and gastrohepatic ligament lymphadenopathy ? Cycle 1 FOLFOX 09/04/2016 ? Cycle 2 FOLFOX 09/18/2016 ? Cycle 3 FOLFOX 10/02/2016 ? Cycle 4 FOLFOX 10/16/2016 ? Cycle 5 FOLFOX 10/30/2016 ? Restaging CTs 11/10/2016-decreased wall thickness distal esophagus; stable borderline mediastinal lymphadenopathy; multiple liver metastases have  completely resolved; stable scattered nonspecific tiny bilateral lung nodules ? Cycle 6 FOLFOX 11/13/2016 ? Cycle 7 FOLFOX 11/27/2016 ? Cycle 8 FOLFOX 12/11/2016 ? Cycle 9 FOLFOX 12/25/2016 ? Cycle 10 FOLFOX (oxaliplatin deleted) 01/08/2017 ? Initiation of maintenance therapy with 5-FU/leucovorin 02/06/2017 2. Diabetes  3. COPD  4. Hypertension  5. History of colon polyps  6. Port-A-Cath placement 08/30/2016  7. 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    Disposition:  [name] appears stable. We discussed treatment options including a treatment break and maintenance therapy with 5-fluorouracil. He understands the lack of a clear survival benefit with maintenance therapy, but the hope of prolonging the current remission. He agrees to proceed with maintenance 5-FU/leucovorin. He cannot afford Xeloda.  He will return to begin maintenance therapy on 02/06/2017. He will be scheduled for an office visit and chemotherapy on 02/20/2017.  We discussed the overall prognosis. He understands the current treatment is palliative.  He has mild oxaliplatin neuropathy. He would like to return to work. I recommended he worked as tolerated realizing he may be limited by neuropathy.  25 minutes were spent with the patient today. The majority of the time was used for counseling and coordination of care.  Donneta Romberg, MD  01/26/2017  9:42 AM

## 2017-02-01 DIAGNOSIS — C155 Malignant neoplasm of lower third of esophagus: Secondary | ICD-10-CM | POA: Diagnosis not present

## 2017-02-05 ENCOUNTER — Other Ambulatory Visit: Payer: Self-pay | Admitting: Nurse Practitioner

## 2017-02-05 DIAGNOSIS — C155 Malignant neoplasm of lower third of esophagus: Secondary | ICD-10-CM

## 2017-02-06 ENCOUNTER — Other Ambulatory Visit (HOSPITAL_BASED_OUTPATIENT_CLINIC_OR_DEPARTMENT_OTHER): Payer: Medicare Other

## 2017-02-06 ENCOUNTER — Other Ambulatory Visit: Payer: Self-pay

## 2017-02-06 ENCOUNTER — Ambulatory Visit (HOSPITAL_BASED_OUTPATIENT_CLINIC_OR_DEPARTMENT_OTHER): Payer: Medicare Other

## 2017-02-06 VITALS — BP 116/67 | HR 80 | Temp 98.6°F | Resp 17

## 2017-02-06 DIAGNOSIS — C155 Malignant neoplasm of lower third of esophagus: Secondary | ICD-10-CM

## 2017-02-06 DIAGNOSIS — Z5111 Encounter for antineoplastic chemotherapy: Secondary | ICD-10-CM

## 2017-02-06 LAB — CBC WITH DIFFERENTIAL/PLATELET
BASO%: 0.4 % (ref 0.0–2.0)
BASOS ABS: 0.1 10*3/uL (ref 0.0–0.1)
EOS ABS: 0.1 10*3/uL (ref 0.0–0.5)
EOS%: 0.9 % (ref 0.0–7.0)
HEMATOCRIT: 45 % (ref 38.4–49.9)
HGB: 15.2 g/dL (ref 13.0–17.1)
LYMPH#: 2.5 10*3/uL (ref 0.9–3.3)
LYMPH%: 18.3 % (ref 14.0–49.0)
MCH: 31.7 pg (ref 27.2–33.4)
MCHC: 33.8 g/dL (ref 32.0–36.0)
MCV: 93.9 fL (ref 79.3–98.0)
MONO#: 1.1 10*3/uL — ABNORMAL HIGH (ref 0.1–0.9)
MONO%: 7.7 % (ref 0.0–14.0)
NEUT#: 10.1 10*3/uL — ABNORMAL HIGH (ref 1.5–6.5)
NEUT%: 72.7 % (ref 39.0–75.0)
PLATELETS: 162 10*3/uL (ref 140–400)
RBC: 4.79 10*6/uL (ref 4.20–5.82)
RDW: 14.4 % (ref 11.0–14.6)
WBC: 13.9 10*3/uL — ABNORMAL HIGH (ref 4.0–10.3)

## 2017-02-06 LAB — COMPREHENSIVE METABOLIC PANEL
ALBUMIN: 3.7 g/dL (ref 3.5–5.0)
ALK PHOS: 104 U/L (ref 40–150)
ALT: 20 U/L (ref 0–55)
ANION GAP: 13 meq/L — AB (ref 3–11)
AST: 18 U/L (ref 5–34)
BUN: 9.8 mg/dL (ref 7.0–26.0)
CALCIUM: 9.9 mg/dL (ref 8.4–10.4)
CHLORIDE: 105 meq/L (ref 98–109)
CO2: 23 mEq/L (ref 22–29)
Creatinine: 0.8 mg/dL (ref 0.7–1.3)
Glucose: 116 mg/dl (ref 70–140)
POTASSIUM: 3.6 meq/L (ref 3.5–5.1)
Sodium: 141 mEq/L (ref 136–145)
Total Bilirubin: 0.4 mg/dL (ref 0.20–1.20)
Total Protein: 7.9 g/dL (ref 6.4–8.3)

## 2017-02-06 MED ORDER — LEUCOVORIN CALCIUM INJECTION 350 MG
400.0000 mg/m2 | Freq: Once | INTRAVENOUS | Status: AC
  Administered 2017-02-06: 832 mg via INTRAVENOUS
  Filled 2017-02-06: qty 41.6

## 2017-02-06 MED ORDER — SODIUM CHLORIDE 0.9 % IV SOLN
2400.0000 mg/m2 | INTRAVENOUS | Status: DC
  Administered 2017-02-06: 5000 mg via INTRAVENOUS
  Filled 2017-02-06: qty 100

## 2017-02-06 MED ORDER — LIDOCAINE-PRILOCAINE 2.5-2.5 % EX CREA: 1.0000 "application " | TOPICAL_CREAM | CUTANEOUS | 0 refills | Status: DC | PRN

## 2017-02-06 MED ORDER — FLUOROURACIL CHEMO INJECTION 2.5 GM/50ML
400.0000 mg/m2 | Freq: Once | INTRAVENOUS | Status: AC
  Administered 2017-02-06: 850 mg via INTRAVENOUS
  Filled 2017-02-06: qty 17

## 2017-02-06 NOTE — Patient Instructions (Signed)
[facility] Discharge Instructions for Patients Receiving Chemotherapy  Today you received the following chemotherapy agents: Leucovorin and Adrucil  To help prevent nausea and vomiting after your treatment, we encourage you to take your nausea medication as directed.    If you develop nausea and vomiting that is not controlled by your nausea medication, call the clinic.   BELOW ARE SYMPTOMS THAT SHOULD BE REPORTED IMMEDIATELY:  *FEVER GREATER THAN 100.5 F  *CHILLS WITH OR WITHOUT FEVER  NAUSEA AND VOMITING THAT IS NOT CONTROLLED WITH YOUR NAUSEA MEDICATION  *UNUSUAL SHORTNESS OF BREATH  *UNUSUAL BRUISING OR BLEEDING  TENDERNESS IN MOUTH AND THROAT WITH OR WITHOUT PRESENCE OF ULCERS  *URINARY PROBLEMS  *BOWEL PROBLEMS  UNUSUAL RASH Items with * indicate a potential emergency and should be followed up as soon as possible.  Feel free to call the clinic you have any questions or concerns. The clinic phone number is [phone].  Please show the CHEMO ALERT CARD at check-in to the Emergency Department and triage nurse.   

## 2017-02-08 ENCOUNTER — Other Ambulatory Visit: Payer: Self-pay

## 2017-02-08 ENCOUNTER — Ambulatory Visit (HOSPITAL_BASED_OUTPATIENT_CLINIC_OR_DEPARTMENT_OTHER): Payer: Medicare Other

## 2017-02-08 VITALS — BP 132/42 | HR 71 | Temp 98.6°F | Resp 16

## 2017-02-08 DIAGNOSIS — C155 Malignant neoplasm of lower third of esophagus: Secondary | ICD-10-CM

## 2017-02-08 MED ORDER — HEPARIN SOD (PORK) LOCK FLUSH 100 UNIT/ML IV SOLN
500.0000 [IU] | Freq: Once | INTRAVENOUS | Status: AC | PRN
  Administered 2017-02-08: 500 [IU]
  Filled 2017-02-08: qty 5

## 2017-02-08 MED ORDER — SODIUM CHLORIDE 0.9% FLUSH
10.0000 mL | INTRAVENOUS | Status: DC | PRN
  Administered 2017-02-08: 10 mL
  Filled 2017-02-08: qty 10

## 2017-02-08 NOTE — Patient Instructions (Signed)
Implanted Port Home Guide An implanted port is a type of central line that is placed under the skin. Central lines are used to provide IV access when treatment or nutrition needs to be given through a person's veins. Implanted ports are used for long-term IV access. An implanted port may be placed because:  You need IV medicine that would be irritating to the small veins in your hands or arms.  You need long-term IV medicines, such as antibiotics.  You need IV nutrition for a long period.  You need frequent blood draws for lab tests.  You need dialysis.  Implanted ports are usually placed in the chest area, but they can also be placed in the upper arm, the abdomen, or the leg. An implanted port has two main parts:  Reservoir. The reservoir is round and will appear as a small, raised area under your skin. The reservoir is the part where a needle is inserted to give medicines or draw blood.  Catheter. The catheter is a thin, flexible tube that extends from the reservoir. The catheter is placed into a large vein. Medicine that is inserted into the reservoir goes into the catheter and then into the vein.  How will I care for my incision site? Do not get the incision site wet. Bathe or shower as directed by your health care provider. How is my port accessed? Special steps must be taken to access the port:  Before the port is accessed, a numbing cream can be placed on the skin. This helps numb the skin over the port site.  Your health care provider uses a sterile technique to access the port. ? Your health care provider must put on a mask and sterile gloves. ? The skin over your port is cleaned carefully with an antiseptic and allowed to dry. ? The port is gently pinched between sterile gloves, and a needle is inserted into the port.  Only "non-coring" port needles should be used to access the port. Once the port is accessed, a blood return should be checked. This helps ensure that the port  is in the vein and is not clogged.  If your port needs to remain accessed for a constant infusion, a clear (transparent) bandage will be placed over the needle site. The bandage and needle will need to be changed every week, or as directed by your health care provider.  Keep the bandage covering the needle clean and dry. Do not get it wet. Follow your health care provider's instructions on how to take a shower or bath while the port is accessed.  If your port does not need to stay accessed, no bandage is needed over the port.  What is flushing? Flushing helps keep the port from getting clogged. Follow your health care provider's instructions on how and when to flush the port. Ports are usually flushed with saline solution or a medicine called heparin. The need for flushing will depend on how the port is used.  If the port is used for intermittent medicines or blood draws, the port will need to be flushed: ? After medicines have been given. ? After blood has been drawn. ? As part of routine maintenance.  If a constant infusion is running, the port may not need to be flushed.  How long will my port stay implanted? The port can stay in for as long as your health care provider thinks it is needed. When it is time for the port to come out, surgery will be   done to remove it. The procedure is similar to the one performed when the port was put in. When should I seek immediate medical care? When you have an implanted port, you should seek immediate medical care if:  You notice a bad smell coming from the incision site.  You have swelling, redness, or drainage at the incision site.  You have more swelling or pain at the port site or the surrounding area.  You have a fever that is not controlled with medicine.  This information is not intended to replace advice given to you by your health care provider. Make sure you discuss any questions you have with your health care provider. Document  Released: 07/10/2005 Document Revised: 12/16/2015 Document Reviewed: 03/17/2013 Elsevier Interactive Patient Education  2017 [employer]  

## 2017-02-12 ENCOUNTER — Encounter: Payer: Self-pay | Admitting: Family Medicine

## 2017-02-12 ENCOUNTER — Telehealth: Payer: Self-pay

## 2017-02-12 ENCOUNTER — Ambulatory Visit (INDEPENDENT_AMBULATORY_CARE_PROVIDER_SITE_OTHER): Payer: Medicare Other | Admitting: Family Medicine

## 2017-02-12 VITALS — BP 140/90 | HR 91 | Temp 98.0°F | Ht 70.0 in | Wt 194.0 lb

## 2017-02-12 DIAGNOSIS — C155 Malignant neoplasm of lower third of esophagus: Secondary | ICD-10-CM | POA: Diagnosis not present

## 2017-02-12 DIAGNOSIS — F17200 Nicotine dependence, unspecified, uncomplicated: Secondary | ICD-10-CM | POA: Diagnosis not present

## 2017-02-12 DIAGNOSIS — E118 Type 2 diabetes mellitus with unspecified complications: Secondary | ICD-10-CM

## 2017-02-12 DIAGNOSIS — L03818 Cellulitis of other sites: Secondary | ICD-10-CM | POA: Diagnosis not present

## 2017-02-12 MED ORDER — DOXYCYCLINE HYCLATE 100 MG PO TABS: 100.0000 mg | ORAL_TABLET | Freq: Two times a day (BID) | ORAL | 0 refills | Status: DC

## 2017-02-12 NOTE — Progress Notes (Signed)
   Subjective:    Patient ID: Justin [name], male    DOB: March 30, 1951, 66 y.o.   MRN: [id number]  HPI He is here for consult concerning getting bit by an insect approximately 2 weeks ago. He did cause some swelling and discomfort in his right hand in the area of the fifth metatarsal dorsal surface. Last night he did open it up and did express some purulent material from the wound. He is being followed by oncology for esophageal CA. He also has an underlying history of diabetes. He does continue to smoke and has no interest in quitting.  Review of Systems     Objective:   Physical Exam right hand exam over the dorsal surface does show a central one and half centimeter whitish area of skin with some small amount of exudate being able to be expressed from it. There is surrounding erythema.      Assessment & Plan:  Cellulitis of other specified site - Plan: doxycycline (VIBRA-TABS) 100 MG tablet  Cancer of distal third of esophagus (HCC)  Current smoker I circled the area with the pen and ask him to keep an eye on this to see if it gets worse. Hopefully the Vibra-Tabs will take care of the problem.

## 2017-02-12 NOTE — Telephone Encounter (Signed)
Pt received a mosquito bite 6 days ago and the hand is swollen and infected. Wife is asking if he needs to see Dr Benay Spice or PCP. Instructed her to call PCP and if unable to get in to call us back. OK to receive antibiotic.

## 2017-02-18 ENCOUNTER — Other Ambulatory Visit: Payer: Self-pay | Admitting: Oncology

## 2017-02-20 ENCOUNTER — Other Ambulatory Visit (HOSPITAL_BASED_OUTPATIENT_CLINIC_OR_DEPARTMENT_OTHER): Payer: Medicare Other

## 2017-02-20 ENCOUNTER — Telehealth: Payer: Self-pay | Admitting: Oncology

## 2017-02-20 ENCOUNTER — Ambulatory Visit (HOSPITAL_BASED_OUTPATIENT_CLINIC_OR_DEPARTMENT_OTHER): Payer: Medicare Other

## 2017-02-20 ENCOUNTER — Ambulatory Visit (HOSPITAL_BASED_OUTPATIENT_CLINIC_OR_DEPARTMENT_OTHER): Payer: Medicare Other | Admitting: Nurse Practitioner

## 2017-02-20 ENCOUNTER — Ambulatory Visit: Payer: Medicare Other

## 2017-02-20 ENCOUNTER — Telehealth: Payer: Self-pay

## 2017-02-20 VITALS — BP 137/80 | HR 90 | Temp 98.0°F | Resp 16 | Ht 70.0 in | Wt 192.3 lb

## 2017-02-20 DIAGNOSIS — I10 Essential (primary) hypertension: Secondary | ICD-10-CM

## 2017-02-20 DIAGNOSIS — C155 Malignant neoplasm of lower third of esophagus: Secondary | ICD-10-CM | POA: Diagnosis not present

## 2017-02-20 DIAGNOSIS — J449 Chronic obstructive pulmonary disease, unspecified: Secondary | ICD-10-CM

## 2017-02-20 DIAGNOSIS — C787 Secondary malignant neoplasm of liver and intrahepatic bile duct: Secondary | ICD-10-CM

## 2017-02-20 DIAGNOSIS — Z5111 Encounter for antineoplastic chemotherapy: Secondary | ICD-10-CM

## 2017-02-20 DIAGNOSIS — Z95828 Presence of other vascular implants and grafts: Secondary | ICD-10-CM

## 2017-02-20 LAB — COMPREHENSIVE METABOLIC PANEL
ALBUMIN: 3.8 g/dL (ref 3.5–5.0)
ALK PHOS: 104 U/L (ref 40–150)
ALT: 22 U/L (ref 0–55)
AST: 20 U/L (ref 5–34)
Anion Gap: 10 mEq/L (ref 3–11)
BUN: 10.3 mg/dL (ref 7.0–26.0)
CALCIUM: 9.6 mg/dL (ref 8.4–10.4)
CO2: 21 mEq/L — ABNORMAL LOW (ref 22–29)
CREATININE: 0.8 mg/dL (ref 0.7–1.3)
Chloride: 105 mEq/L (ref 98–109)
EGFR: >90 mL/min/{1.73_m2} (ref >90)
Glucose: 150 mg/dl — ABNORMAL HIGH (ref 70–140)
Potassium: 3.6 mEq/L (ref 3.5–5.1)
Sodium: 136 mEq/L (ref 136–145)
Total Bilirubin: 0.38 mg/dL (ref 0.20–1.20)
Total Protein: 7.9 g/dL (ref 6.4–8.3)

## 2017-02-20 LAB — CBC WITH DIFFERENTIAL/PLATELET
BASO%: 0.2 % (ref 0.0–2.0)
BASOS ABS: 0 10*3/uL (ref 0.0–0.1)
EOS ABS: 0.2 10*3/uL (ref 0.0–0.5)
EOS%: 1.2 % (ref 0.0–7.0)
HCT: 44.8 % (ref 38.4–49.9)
HEMOGLOBIN: 15.1 g/dL (ref 13.0–17.1)
LYMPH%: 15.8 % (ref 14.0–49.0)
MCH: 31.6 pg (ref 27.2–33.4)
MCHC: 33.7 g/dL (ref 32.0–36.0)
MCV: 93.7 fL (ref 79.3–98.0)
MONO#: 0.8 10*3/uL (ref 0.1–0.9)
MONO%: 7 % (ref 0.0–14.0)
NEUT#: 9.1 10*3/uL — ABNORMAL HIGH (ref 1.5–6.5)
NEUT%: 75.8 % — AB (ref 39.0–75.0)
Platelets: 130 10*3/uL — ABNORMAL LOW (ref 140–400)
RBC: 4.78 10*6/uL (ref 4.20–5.82)
RDW: 14.3 % (ref 11.0–14.6)
WBC: 12.1 10*3/uL — ABNORMAL HIGH (ref 4.0–10.3)
lymph#: 1.9 10*3/uL (ref 0.9–3.3)

## 2017-02-20 LAB — CEA (IN HOUSE-CHCC): CEA (CHCC-IN HOUSE): 3.6 ng/mL (ref 0.00–5.00)

## 2017-02-20 MED ORDER — LEUCOVORIN CALCIUM INJECTION 350 MG
400.0000 mg/m2 | Freq: Once | INTRAVENOUS | Status: AC
  Administered 2017-02-20: 832 mg via INTRAVENOUS
  Filled 2017-02-20: qty 41.6

## 2017-02-20 MED ORDER — FLUOROURACIL CHEMO INJECTION 2.5 GM/50ML
400.0000 mg/m2 | Freq: Once | INTRAVENOUS | Status: AC
  Administered 2017-02-20: 850 mg via INTRAVENOUS
  Filled 2017-02-20: qty 17

## 2017-02-20 MED ORDER — SODIUM CHLORIDE 0.9 % IV SOLN
2400.0000 mg/m2 | INTRAVENOUS | Status: DC
  Administered 2017-02-20: 5000 mg via INTRAVENOUS
  Filled 2017-02-20: qty 100

## 2017-02-20 MED ORDER — SODIUM CHLORIDE 0.9% FLUSH
10.0000 mL | INTRAVENOUS | Status: DC | PRN
  Administered 2017-02-20: 10 mL via INTRAVENOUS
  Filled 2017-02-20: qty 10

## 2017-02-20 MED ORDER — DEXTROSE 5 % IV SOLN
Freq: Once | INTRAVENOUS | Status: AC
  Administered 2017-02-20: 11:00:00 via INTRAVENOUS

## 2017-02-20 NOTE — Progress Notes (Signed)
  [facility] OFFICE PROGRESS NOTE   Diagnosis:  Esophagus cancer  INTERVAL HISTORY:   [name] returns as scheduled. He began maintenance therapy with 5-FU/leucovorin 02/06/2017. He notes progressive fatigue. He denies nausea/vomiting. No mouth sores. No diarrhea. No hand or foot pain or redness. He has persistent numbness in the fingertips and toes. No dysphagia. He is tolerating a regular diet.  Objective:  Vital signs in last 24 hours:  Blood pressure 137/80, pulse 90, temperature 98 F (36.7 C), temperature source Oral, resp. rate 16, height '5\' 10"'$  (1.778 m), weight 192 lb 4.8 oz (87.2 kg), SpO2 96 %.    HEENT: No thrush or ulcers. Resp: Lungs clear bilaterally. Cardio: Regular rate and rhythm. GI: No hepatomegaly. Vascular: No leg edema. Calves soft and nontender.  Skin: Palms without erythema. Port-A-Cath without erythema.    Lab Results:  Lab Results  Component Value Date   WBC 12.1 (H) 02/20/2017   HGB 15.1 02/20/2017   HCT 44.8 02/20/2017   MCV 93.7 02/20/2017   PLT 130 (L) 02/20/2017   NEUTROABS 9.1 (H) 02/20/2017    Imaging:  No results found.  Medications: I have reviewed the patient's current medications.  Assessment/Plan: 1. 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 Staging CTs 08/18/2016 with nonspecific pulmonary nodules, liver metastases, borderline thoracic and gastrohepatic ligament lymphadenopathy ? Cycle 1 FOLFOX 09/04/2016 ? Cycle 2 FOLFOX 09/18/2016 ? Cycle 3 FOLFOX 10/02/2016 ? Cycle 4 FOLFOX 10/16/2016 ? Cycle 5 FOLFOX 10/30/2016 ? Restaging CTs 11/10/2016-decreased wall thickness distal esophagus; stable borderline mediastinal lymphadenopathy; multiple liver metastases have completely resolved; stable scattered nonspecific tiny bilateral lung nodules ? Cycle 6 FOLFOX 11/13/2016 ? Cycle 7 FOLFOX 11/27/2016 ? Cycle 8 FOLFOX 12/11/2016 ? Cycle 9 FOLFOX  12/25/2016 ? Cycle 10 FOLFOX (oxaliplatin deleted) 01/08/2017 ? Initiation of maintenance therapy with 5-FU/leucovorin 02/06/2017 2. Diabetes  3. COPD  4. Hypertension  5. History of colon polyps  6. Port-A-Cath placement 08/30/2016  7. 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   Disposition:[name] appears stable. Plan to continue maintenance therapy with 5-FU/leucovorin every 2 weeks. We will see him in follow-up 03/06/2017. He will contact the office in the interim with any problems.    Ned Card ANP/GNP-BC   02/20/2017  8:48 AM

## 2017-02-20 NOTE — Telephone Encounter (Signed)
Gave relative avs report and appointments for August.

## 2017-02-20 NOTE — Patient Instructions (Signed)
[facility] Discharge Instructions for Patients Receiving Chemotherapy  Today you received the following chemotherapy agents Leucovorin, Adrucil  To help prevent nausea and vomiting after your treatment, we encourage you to take your nausea medication   If you develop nausea and vomiting that is not controlled by your nausea medication, call the clinic.   BELOW ARE SYMPTOMS THAT SHOULD BE REPORTED IMMEDIATELY:  *FEVER GREATER THAN 100.5 F  *CHILLS WITH OR WITHOUT FEVER  NAUSEA AND VOMITING THAT IS NOT CONTROLLED WITH YOUR NAUSEA MEDICATION  *UNUSUAL SHORTNESS OF BREATH  *UNUSUAL BRUISING OR BLEEDING  TENDERNESS IN MOUTH AND THROAT WITH OR WITHOUT PRESENCE OF ULCERS  *URINARY PROBLEMS  *BOWEL PROBLEMS  UNUSUAL RASH Items with * indicate a potential emergency and should be followed up as soon as possible.  Feel free to call the clinic you have any questions or concerns. The clinic phone number is [phone].  Please show the [facility] at check-in to the Emergency Department and triage nurse.

## 2017-02-20 NOTE — Telephone Encounter (Signed)
Patient stopped by to get a pump d/c added at 1100 on thursday

## 2017-02-20 NOTE — Patient Instructions (Signed)
Implanted Port Home Guide An implanted port is a type of central line that is placed under the skin. Central lines are used to provide IV access when treatment or nutrition needs to be given through a person's veins. Implanted ports are used for long-term IV access. An implanted port may be placed because:  You need IV medicine that would be irritating to the small veins in your hands or arms.  You need long-term IV medicines, such as antibiotics.  You need IV nutrition for a long period.  You need frequent blood draws for lab tests.  You need dialysis.  Implanted ports are usually placed in the chest area, but they can also be placed in the upper arm, the abdomen, or the leg. An implanted port has two main parts:  Reservoir. The reservoir is round and will appear as a small, raised area under your skin. The reservoir is the part where a needle is inserted to give medicines or draw blood.  Catheter. The catheter is a thin, flexible tube that extends from the reservoir. The catheter is placed into a large vein. Medicine that is inserted into the reservoir goes into the catheter and then into the vein.  How will I care for my incision site? Do not get the incision site wet. Bathe or shower as directed by your health care provider. How is my port accessed? Special steps must be taken to access the port:  Before the port is accessed, a numbing cream can be placed on the skin. This helps numb the skin over the port site.  Your health care provider uses a sterile technique to access the port. ? Your health care provider must put on a mask and sterile gloves. ? The skin over your port is cleaned carefully with an antiseptic and allowed to dry. ? The port is gently pinched between sterile gloves, and a needle is inserted into the port.  Only "non-coring" port needles should be used to access the port. Once the port is accessed, a blood return should be checked. This helps ensure that the port  is in the vein and is not clogged.  If your port needs to remain accessed for a constant infusion, a clear (transparent) bandage will be placed over the needle site. The bandage and needle will need to be changed every week, or as directed by your health care provider.  Keep the bandage covering the needle clean and dry. Do not get it wet. Follow your health care provider's instructions on how to take a shower or bath while the port is accessed.  If your port does not need to stay accessed, no bandage is needed over the port.  What is flushing? Flushing helps keep the port from getting clogged. Follow your health care provider's instructions on how and when to flush the port. Ports are usually flushed with saline solution or a medicine called heparin. The need for flushing will depend on how the port is used.  If the port is used for intermittent medicines or blood draws, the port will need to be flushed: ? After medicines have been given. ? After blood has been drawn. ? As part of routine maintenance.  If a constant infusion is running, the port may not need to be flushed.  How long will my port stay implanted? The port can stay in for as long as your health care provider thinks it is needed. When it is time for the port to come out, surgery will be   done to remove it. The procedure is similar to the one performed when the port was put in. When should I seek immediate medical care? When you have an implanted port, you should seek immediate medical care if:  You notice a bad smell coming from the incision site.  You have swelling, redness, or drainage at the incision site.  You have more swelling or pain at the port site or the surrounding area.  You have a fever that is not controlled with medicine.  This information is not intended to replace advice given to you by your health care provider. Make sure you discuss any questions you have with your health care provider. Document  Released: 07/10/2005 Document Revised: 12/16/2015 Document Reviewed: 03/17/2013 Elsevier Interactive Patient Education  2017 [employer]  

## 2017-02-22 ENCOUNTER — Ambulatory Visit (HOSPITAL_BASED_OUTPATIENT_CLINIC_OR_DEPARTMENT_OTHER): Payer: Medicare Other

## 2017-02-22 VITALS — BP 145/77 | HR 80 | Temp 97.8°F | Resp 18

## 2017-02-22 DIAGNOSIS — C155 Malignant neoplasm of lower third of esophagus: Secondary | ICD-10-CM

## 2017-02-22 MED ORDER — SODIUM CHLORIDE 0.9% FLUSH
10.0000 mL | INTRAVENOUS | Status: DC | PRN
  Administered 2017-02-22: 10 mL
  Filled 2017-02-22: qty 10

## 2017-02-22 MED ORDER — HEPARIN SOD (PORK) LOCK FLUSH 100 UNIT/ML IV SOLN
500.0000 [IU] | Freq: Once | INTRAVENOUS | Status: AC | PRN
  Administered 2017-02-22: 500 [IU]
  Filled 2017-02-22: qty 5

## 2017-02-22 NOTE — Patient Instructions (Signed)
Implanted Port Home Guide An implanted port is a type of central line that is placed under the skin. Central lines are used to provide IV access when treatment or nutrition needs to be given through a person's veins. Implanted ports are used for long-term IV access. An implanted port may be placed because:  You need IV medicine that would be irritating to the small veins in your hands or arms.  You need long-term IV medicines, such as antibiotics.  You need IV nutrition for a long period.  You need frequent blood draws for lab tests.  You need dialysis.  Implanted ports are usually placed in the chest area, but they can also be placed in the upper arm, the abdomen, or the leg. An implanted port has two main parts:  Reservoir. The reservoir is round and will appear as a small, raised area under your skin. The reservoir is the part where a needle is inserted to give medicines or draw blood.  Catheter. The catheter is a thin, flexible tube that extends from the reservoir. The catheter is placed into a large vein. Medicine that is inserted into the reservoir goes into the catheter and then into the vein.  How will I care for my incision site? Do not get the incision site wet. Bathe or shower as directed by your health care provider. How is my port accessed? Special steps must be taken to access the port:  Before the port is accessed, a numbing cream can be placed on the skin. This helps numb the skin over the port site.  Your health care provider uses a sterile technique to access the port. ? Your health care provider must put on a mask and sterile gloves. ? The skin over your port is cleaned carefully with an antiseptic and allowed to dry. ? The port is gently pinched between sterile gloves, and a needle is inserted into the port.  Only "non-coring" port needles should be used to access the port. Once the port is accessed, a blood return should be checked. This helps ensure that the port  is in the vein and is not clogged.  If your port needs to remain accessed for a constant infusion, a clear (transparent) bandage will be placed over the needle site. The bandage and needle will need to be changed every week, or as directed by your health care provider.  Keep the bandage covering the needle clean and dry. Do not get it wet. Follow your health care provider's instructions on how to take a shower or bath while the port is accessed.  If your port does not need to stay accessed, no bandage is needed over the port.  What is flushing? Flushing helps keep the port from getting clogged. Follow your health care provider's instructions on how and when to flush the port. Ports are usually flushed with saline solution or a medicine called heparin. The need for flushing will depend on how the port is used.  If the port is used for intermittent medicines or blood draws, the port will need to be flushed: ? After medicines have been given. ? After blood has been drawn. ? As part of routine maintenance.  If a constant infusion is running, the port may not need to be flushed.  How long will my port stay implanted? The port can stay in for as long as your health care provider thinks it is needed. When it is time for the port to come out, surgery will be   done to remove it. The procedure is similar to the one performed when the port was put in. When should I seek immediate medical care? When you have an implanted port, you should seek immediate medical care if:  You notice a bad smell coming from the incision site.  You have swelling, redness, or drainage at the incision site.  You have more swelling or pain at the port site or the surrounding area.  You have a fever that is not controlled with medicine.  This information is not intended to replace advice given to you by your health care provider. Make sure you discuss any questions you have with your health care provider. Document  Released: 07/10/2005 Document Revised: 12/16/2015 Document Reviewed: 03/17/2013 Elsevier Interactive Patient Education  2017 [employer]  

## 2017-02-26 ENCOUNTER — Encounter: Payer: Self-pay | Admitting: Family Medicine

## 2017-02-26 ENCOUNTER — Ambulatory Visit (INDEPENDENT_AMBULATORY_CARE_PROVIDER_SITE_OTHER): Payer: Medicare Other | Admitting: Family Medicine

## 2017-02-26 VITALS — BP 150/80 | HR 84 | Ht 70.0 in | Wt 196.0 lb

## 2017-02-26 DIAGNOSIS — E1159 Type 2 diabetes mellitus with other circulatory complications: Secondary | ICD-10-CM

## 2017-02-26 DIAGNOSIS — Z1159 Encounter for screening for other viral diseases: Secondary | ICD-10-CM | POA: Diagnosis not present

## 2017-02-26 DIAGNOSIS — T451X5A Adverse effect of antineoplastic and immunosuppressive drugs, initial encounter: Secondary | ICD-10-CM

## 2017-02-26 DIAGNOSIS — I10 Essential (primary) hypertension: Secondary | ICD-10-CM | POA: Diagnosis not present

## 2017-02-26 DIAGNOSIS — C155 Malignant neoplasm of lower third of esophagus: Secondary | ICD-10-CM | POA: Diagnosis not present

## 2017-02-26 DIAGNOSIS — Z125 Encounter for screening for malignant neoplasm of prostate: Secondary | ICD-10-CM

## 2017-02-26 DIAGNOSIS — E1169 Type 2 diabetes mellitus with other specified complication: Secondary | ICD-10-CM

## 2017-02-26 DIAGNOSIS — G620 Drug-induced polyneuropathy: Secondary | ICD-10-CM

## 2017-02-26 DIAGNOSIS — F17200 Nicotine dependence, unspecified, uncomplicated: Secondary | ICD-10-CM | POA: Diagnosis not present

## 2017-02-26 DIAGNOSIS — E785 Hyperlipidemia, unspecified: Secondary | ICD-10-CM

## 2017-02-26 DIAGNOSIS — E118 Type 2 diabetes mellitus with unspecified complications: Secondary | ICD-10-CM | POA: Diagnosis not present

## 2017-02-26 LAB — POCT UA - MICROALBUMIN
Creatinine, POC: 82 mg/dL
MICROALBUMIN (UR) POC: 11.6 mg/L

## 2017-02-26 LAB — LIPID PANEL
Cholesterol: 115 mg/dL (ref <200)
HDL: 24 mg/dL — ABNORMAL LOW (ref >40)
LDL CALC: 69 mg/dL (ref <100)
TRIGLYCERIDES: 110 mg/dL (ref <150)
Total CHOL/HDL Ratio: 4.8 Ratio (ref <5.0)
VLDL: 22 mg/dL (ref <30)

## 2017-02-26 LAB — POCT GLYCOSYLATED HEMOGLOBIN (HGB A1C): Hemoglobin A1C: 7

## 2017-02-26 NOTE — Progress Notes (Signed)
Subjective:   HPI  Justin [name] is a 66 y.o. male who presents for No chief complaint on file.   Medical care team includes: Denita Lung, MD here for primary care  Oncology Dr Benay Spice Preventative care: [employer] Last ophthalmology visit: couple years ago Last dental visit:N/A Last colonoscopy:11/04/10 Last prostate exam: ? Last EKG: Last labs:01/28/11  Prior vaccinations:  TD or Tdap:10/31/08 Influenza:05/26/16 Pneumococcal:23: 11/05/08, 13: 05/22/14 Shingles/Zostavax:04/07/11   Advanced directive: Yes. Asked for a copy Concerns: He does check his blood sugars and they usually are under 120. He does have regular eye checks. He is having some tingling and burning sensations in his feet and in his hands. He continues on chemotherapy for his cancer. He is also having some slight hoarseness of his voice because of this. He does continue to smoke but is considering stopping and having a contest with his wife concerning this. His exercise is minimal. He continues on simvastatin for his cholesterol is having no aches or pains. He also is taking lisinopril/HCTZ and has not had any swelling or coughing. Continues on Januvia and is doing well on this. Reviewed their medical, surgical, family, social, medication, and allergy history and updated chart as appropriate.  Past Medical History:  Diagnosis Date  . Allergy   . Cancer ([place])   . Cataract   . Diabetes mellitus   . Headache   . History of kidney stones   . Hx of colonic polyps    pre-cancerous  . Hypercholesterolemia   . Hypertension     Past Surgical History:  Procedure Laterality Date  . COLONOSCOPY  [id number]  . ESOPHAGOGASTRODUODENOSCOPY (EGD) WITH PROPOFOL N/A 08/18/2016   Procedure: ESOPHAGOGASTRODUODENOSCOPY (EGD) WITH PROPOFOL;  Surgeon: Carol Ada, MD;  Location: [facility];  Service: Endoscopy;  Laterality: N/A;  . IR GENERIC HISTORICAL  08/30/2016   IR US GUIDE VASC ACCESS RIGHT 08/30/2016 Corrie Mckusick, DO  [facility]  . IR GENERIC HISTORICAL  08/30/2016   IR FLUORO GUIDE PORT INSERTION RIGHT 08/30/2016 Corrie Mckusick, DO [facility]    Social History   Social History  . Marital status: Married    Spouse name: N/A  . Number of children: N/A  . Years of education: N/A   Occupational History  . Not on file.   Social History Main Topics  . Smoking status: Current Every Day Smoker    Packs/day: 1.00    Years: 50.00    Types: Cigarettes  . Smokeless tobacco: Never Used  . Alcohol use No  . Drug use: No  . Sexual activity: Yes   Other Topics Concern  . Not on file   Social History Narrative  . No narrative on file    Family History  Problem Relation Age of Onset  . Stomach cancer Father   . Breast cancer Sister   . Liver cancer Brother      Current Outpatient Prescriptions:  .  doxycycline (VIBRA-TABS) 100 MG tablet, Take 1 tablet (100 mg total) by mouth 2 (two) times daily., Disp: 20 tablet, Rfl: 0 .  finasteride (PROSCAR) 5 MG tablet, Take 1 tablet (5 mg total) by mouth daily., Disp: 90 tablet, Rfl: 3 .  glucose blood test strip, PT IS TO TEST ONE TO TWO TIMES A DAY, Disp: 100 each, Rfl: PRN .  ibuprofen (ADVIL,MOTRIN) 100 MG chewable tablet, Chew 1 tablet (100 mg total) by mouth every 8 (eight) hours as needed. Reported on 09/30/2015 (Patient taking differently: Chew 200 mg by mouth every  8 (eight) hours as needed. Reported on 09/30/2015), Disp: 30 tablet, Rfl: 5 .  JANUVIA 100 MG tablet, TAKE 1 TABLET DAILY, Disp: 90 tablet, Rfl: 1 .  Lancets (FREESTYLE) lancets, PT IS TO TEST ONE TO TWO TIMES A DAY, Disp: 100 each, Rfl: PRN .  lidocaine-prilocaine (EMLA) cream, Apply 1 application topically as needed. Apply to port site and cover with plastic wrap one hour before port access, Disp: 30 g, Rfl: 0 .  lisinopril-hydrochlorothiazide (PRINZIDE,ZESTORETIC) 10-12.5 MG tablet, Take 1 tablet by mouth daily., Disp: 90 tablet, Rfl: 3 .  prochlorperazine (COMPAZINE) 10 MG tablet, Take 1  tablet (10 mg total) by mouth every 6 (six) hours as needed for nausea or vomiting. (Patient not taking: Reported on 12/11/2016), Disp: 30 tablet, Rfl: 0 .  simvastatin (ZOCOR) 10 MG tablet, Take 1 tablet (10 mg total) by mouth at bedtime. (Patient taking differently: Take 10 mg by mouth daily. ), Disp: 90 tablet, Rfl: 3 No current facility-administered medications for this visit.   Facility-Administered Medications Ordered in Other Visits:  .  sodium chloride flush (NS) 0.9 % injection 10 mL, 10 mL, Intravenous, PRN, Ladell Pier, MD, 10 mL at 12/25/16 0802  No Known Allergies  Review of Systems Negative except as above     Objective:  General appearance: alert, no distress, WD/WN, Caucasian male Hemoglobin A1c is 7.0  Assessment and Plan :    Cancer of distal third of esophagus ([place])  Current smoker  Type 2 diabetes mellitus with complication, without long-term current use of insulin ([place]) - Plan: POCT UA - Microalbumin, HgB A1c  Hypertension associated with diabetes ([place])  Hyperlipidemia associated with type 2 diabetes mellitus ([place]) - Plan: Lipid panel  Need for hepatitis C screening test - Plan: Hepatitis C antibody  Screening for prostate cancer - Plan: PSA  Neuropathy due to chemotherapeutic drug ([place]) I encouraged him to go ahead and quit smoking and set up a contest between he and his wife. He will continue in his cancer chemotherapy. Also continue on his diabetes/hypertension/hyperlipidemia therapies. His neuropathy is being followed closely by Dr. Benay Spice  Physical exam - discussed and counseled on healthy lifestyle, diet, exercise, preventative care, vaccinations, sick and well care, proper use of emergency dept and after hours care, and addressed their concerns.

## 2017-02-27 LAB — PSA: PSA: 0.3 ng/mL (ref <=4.0)

## 2017-02-27 LAB — HEPATITIS C ANTIBODY: HCV Ab: NONREACTIVE

## 2017-03-04 ENCOUNTER — Other Ambulatory Visit: Payer: Self-pay | Admitting: Oncology

## 2017-03-04 DIAGNOSIS — C155 Malignant neoplasm of lower third of esophagus: Secondary | ICD-10-CM | POA: Diagnosis not present

## 2017-03-06 ENCOUNTER — Other Ambulatory Visit (HOSPITAL_BASED_OUTPATIENT_CLINIC_OR_DEPARTMENT_OTHER): Payer: Medicare Other

## 2017-03-06 ENCOUNTER — Telehealth: Payer: Self-pay | Admitting: Nurse Practitioner

## 2017-03-06 ENCOUNTER — Ambulatory Visit: Payer: Medicare Other

## 2017-03-06 ENCOUNTER — Ambulatory Visit (HOSPITAL_BASED_OUTPATIENT_CLINIC_OR_DEPARTMENT_OTHER): Payer: Medicare Other

## 2017-03-06 ENCOUNTER — Ambulatory Visit (HOSPITAL_BASED_OUTPATIENT_CLINIC_OR_DEPARTMENT_OTHER): Payer: Medicare Other | Admitting: Nurse Practitioner

## 2017-03-06 VITALS — BP 142/82 | HR 81 | Temp 97.9°F | Resp 18 | Ht 70.0 in | Wt 193.3 lb

## 2017-03-06 DIAGNOSIS — C787 Secondary malignant neoplasm of liver and intrahepatic bile duct: Secondary | ICD-10-CM

## 2017-03-06 DIAGNOSIS — C155 Malignant neoplasm of lower third of esophagus: Secondary | ICD-10-CM

## 2017-03-06 DIAGNOSIS — Z95828 Presence of other vascular implants and grafts: Secondary | ICD-10-CM

## 2017-03-06 DIAGNOSIS — E119 Type 2 diabetes mellitus without complications: Secondary | ICD-10-CM

## 2017-03-06 DIAGNOSIS — R918 Other nonspecific abnormal finding of lung field: Secondary | ICD-10-CM

## 2017-03-06 DIAGNOSIS — Z5111 Encounter for antineoplastic chemotherapy: Secondary | ICD-10-CM | POA: Diagnosis not present

## 2017-03-06 LAB — CBC WITH DIFFERENTIAL/PLATELET
BASO%: 0.7 % (ref 0.0–2.0)
BASOS ABS: 0.1 10*3/uL (ref 0.0–0.1)
EOS%: 1.2 % (ref 0.0–7.0)
Eosinophils Absolute: 0.1 10*3/uL (ref 0.0–0.5)
HEMATOCRIT: 45.9 % (ref 38.4–49.9)
HEMOGLOBIN: 15.5 g/dL (ref 13.0–17.1)
LYMPH#: 2.1 10*3/uL (ref 0.9–3.3)
LYMPH%: 17 % (ref 14.0–49.0)
MCH: 31.8 pg (ref 27.2–33.4)
MCHC: 33.8 g/dL (ref 32.0–36.0)
MCV: 94 fL (ref 79.3–98.0)
MONO#: 1 10*3/uL — AB (ref 0.1–0.9)
MONO%: 8.5 % (ref 0.0–14.0)
NEUT#: 8.9 10*3/uL — ABNORMAL HIGH (ref 1.5–6.5)
NEUT%: 72.6 % (ref 39.0–75.0)
PLATELETS: 166 10*3/uL (ref 140–400)
RBC: 4.88 10*6/uL (ref 4.20–5.82)
RDW: 14.8 % — AB (ref 11.0–14.6)
WBC: 12.2 10*3/uL — ABNORMAL HIGH (ref 4.0–10.3)

## 2017-03-06 LAB — COMPREHENSIVE METABOLIC PANEL
ALBUMIN: 3.7 g/dL (ref 3.5–5.0)
ALK PHOS: 105 U/L (ref 40–150)
ALT: 20 U/L (ref 0–55)
AST: 20 U/L (ref 5–34)
Anion Gap: 9 mEq/L (ref 3–11)
BILIRUBIN TOTAL: 0.45 mg/dL (ref 0.20–1.20)
BUN: 10.1 mg/dL (ref 7.0–26.0)
CO2: 23 meq/L (ref 22–29)
CREATININE: 0.9 mg/dL (ref 0.7–1.3)
Calcium: 9.8 mg/dL (ref 8.4–10.4)
Chloride: 106 mEq/L (ref 98–109)
EGFR: 89 mL/min/{1.73_m2} — ABNORMAL LOW (ref >90)
GLUCOSE: 160 mg/dL — AB (ref 70–140)
Potassium: 3.7 mEq/L (ref 3.5–5.1)
SODIUM: 138 meq/L (ref 136–145)
TOTAL PROTEIN: 7.8 g/dL (ref 6.4–8.3)

## 2017-03-06 LAB — CEA (IN HOUSE-CHCC): CEA (CHCC-In House): 4.07 ng/mL (ref 0.00–5.00)

## 2017-03-06 MED ORDER — LIDOCAINE-PRILOCAINE 2.5-2.5 % EX CREA: 1.0000 "application " | TOPICAL_CREAM | CUTANEOUS | 2 refills | Status: DC | PRN

## 2017-03-06 MED ORDER — FLUOROURACIL CHEMO INJECTION 2.5 GM/50ML
400.0000 mg/m2 | Freq: Once | INTRAVENOUS | Status: AC
  Administered 2017-03-06: 850 mg via INTRAVENOUS
  Filled 2017-03-06: qty 17

## 2017-03-06 MED ORDER — DEXTROSE 5 % IV SOLN
Freq: Once | INTRAVENOUS | Status: AC
  Administered 2017-03-06: 11:00:00 via INTRAVENOUS

## 2017-03-06 MED ORDER — LEUCOVORIN CALCIUM INJECTION 350 MG
400.0000 mg/m2 | Freq: Once | INTRAVENOUS | Status: AC
  Administered 2017-03-06: 832 mg via INTRAVENOUS
  Filled 2017-03-06: qty 41.6

## 2017-03-06 MED ORDER — SODIUM CHLORIDE 0.9 % IV SOLN: Freq: Once | INTRAVENOUS | Status: DC

## 2017-03-06 MED ORDER — SODIUM CHLORIDE 0.9 % IV SOLN
2400.0000 mg/m2 | INTRAVENOUS | Status: DC
  Administered 2017-03-06: 5000 mg via INTRAVENOUS
  Filled 2017-03-06: qty 100

## 2017-03-06 MED ORDER — SODIUM CHLORIDE 0.9% FLUSH
10.0000 mL | INTRAVENOUS | Status: DC | PRN
  Administered 2017-03-06: 10 mL via INTRAVENOUS
  Filled 2017-03-06: qty 10

## 2017-03-06 NOTE — Telephone Encounter (Signed)
Scheduled appt per 8/14 los - Gave patient AVS and calender per los.  

## 2017-03-06 NOTE — Patient Instructions (Signed)
[facility] Discharge Instructions for Patients Receiving Chemotherapy  Today you received the following chemotherapy agents: Leucovorin/5 FU To help prevent nausea and vomiting after your treatment, we encourage you to take your nausea medication as prescribed.   If you develop nausea and vomiting that is not controlled by your nausea medication, call the clinic.   BELOW ARE SYMPTOMS THAT SHOULD BE REPORTED IMMEDIATELY:  *FEVER GREATER THAN 100.5 F  *CHILLS WITH OR WITHOUT FEVER  NAUSEA AND VOMITING THAT IS NOT CONTROLLED WITH YOUR NAUSEA MEDICATION  *UNUSUAL SHORTNESS OF BREATH  *UNUSUAL BRUISING OR BLEEDING  TENDERNESS IN MOUTH AND THROAT WITH OR WITHOUT PRESENCE OF ULCERS  *URINARY PROBLEMS  *BOWEL PROBLEMS  UNUSUAL RASH Items with * indicate a potential emergency and should be followed up as soon as possible.  Feel free to call the clinic you have any questions or concerns. The clinic phone number is [phone].  Please show the CHEMO ALERT CARD at check-in to the Emergency Department and triage nurse.   

## 2017-03-06 NOTE — Patient Instructions (Signed)
Implanted Port Home Guide An implanted port is a type of central line that is placed under the skin. Central lines are used to provide IV access when treatment or nutrition needs to be given through a person's veins. Implanted ports are used for long-term IV access. An implanted port may be placed because:  You need IV medicine that would be irritating to the small veins in your hands or arms.  You need long-term IV medicines, such as antibiotics.  You need IV nutrition for a long period.  You need frequent blood draws for lab tests.  You need dialysis.  Implanted ports are usually placed in the chest area, but they can also be placed in the upper arm, the abdomen, or the leg. An implanted port has two main parts:  Reservoir. The reservoir is round and will appear as a small, raised area under your skin. The reservoir is the part where a needle is inserted to give medicines or draw blood.  Catheter. The catheter is a thin, flexible tube that extends from the reservoir. The catheter is placed into a large vein. Medicine that is inserted into the reservoir goes into the catheter and then into the vein.  How will I care for my incision site? Do not get the incision site wet. Bathe or shower as directed by your health care provider. How is my port accessed? Special steps must be taken to access the port:  Before the port is accessed, a numbing cream can be placed on the skin. This helps numb the skin over the port site.  Your health care provider uses a sterile technique to access the port. ? Your health care provider must put on a mask and sterile gloves. ? The skin over your port is cleaned carefully with an antiseptic and allowed to dry. ? The port is gently pinched between sterile gloves, and a needle is inserted into the port.  Only "non-coring" port needles should be used to access the port. Once the port is accessed, a blood return should be checked. This helps ensure that the port  is in the vein and is not clogged.  If your port needs to remain accessed for a constant infusion, a clear (transparent) bandage will be placed over the needle site. The bandage and needle will need to be changed every week, or as directed by your health care provider.  Keep the bandage covering the needle clean and dry. Do not get it wet. Follow your health care provider's instructions on how to take a shower or bath while the port is accessed.  If your port does not need to stay accessed, no bandage is needed over the port.  What is flushing? Flushing helps keep the port from getting clogged. Follow your health care provider's instructions on how and when to flush the port. Ports are usually flushed with saline solution or a medicine called heparin. The need for flushing will depend on how the port is used.  If the port is used for intermittent medicines or blood draws, the port will need to be flushed: ? After medicines have been given. ? After blood has been drawn. ? As part of routine maintenance.  If a constant infusion is running, the port may not need to be flushed.  How long will my port stay implanted? The port can stay in for as long as your health care provider thinks it is needed. When it is time for the port to come out, surgery will be   done to remove it. The procedure is similar to the one performed when the port was put in. When should I seek immediate medical care? When you have an implanted port, you should seek immediate medical care if:  You notice a bad smell coming from the incision site.  You have swelling, redness, or drainage at the incision site.  You have more swelling or pain at the port site or the surrounding area.  You have a fever that is not controlled with medicine.  This information is not intended to replace advice given to you by your health care provider. Make sure you discuss any questions you have with your health care provider. Document  Released: 07/10/2005 Document Revised: 12/16/2015 Document Reviewed: 03/17/2013 Elsevier Interactive Patient Education  2017 [employer]  

## 2017-03-06 NOTE — Progress Notes (Signed)
  [facility] OFFICE PROGRESS NOTE   Diagnosis:  Esophagus cancer  INTERVAL HISTORY:   Justin [name] returns as scheduled. He completed another cycle of 5-FU/leucovorin 02/20/2017. He denies nausea/vomiting. No mouth sores. No diarrhea. He has persistent numbness in the fingertips and feet. He has intermittent bilateral hip and back pain. This improves with ibuprofen.  Objective:  Vital signs in last 24 hours:  Blood pressure (!) 142/82, pulse 81, temperature 97.9 F (36.6 C), temperature source Oral, resp. rate 18, height _0  (1.778 m), weight 193 lb 4.8 oz (87.7 kg), SpO2 97 %.    HEENT: No thrush or ulcers. Resp: Lungs clear bilaterally. Cardio: Regular rate and rhythm. GI: No hepatomegaly. Vascular: No leg edema. Skin: Palms without erythema. Port-A-Cath without erythema.    Lab Results:  Lab Results  Component Value Date   WBC 12.2 (H) 03/06/2017   HGB 15.5 03/06/2017   HCT 45.9 03/06/2017   MCV 94.0 03/06/2017   PLT 166 03/06/2017   NEUTROABS 8.9 (H) 03/06/2017    Imaging:  No results found.  Medications: I have reviewed the patient's current medications.  Assessment/Plan: 1. 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 Staging CTs 08/18/2016 with nonspecific pulmonary nodules, liver metastases, borderline thoracic and gastrohepatic ligament lymphadenopathy ? Cycle 1 FOLFOX 09/04/2016 ? Cycle 2 FOLFOX 09/18/2016 ? Cycle 3 FOLFOX 10/02/2016 ? Cycle 4 FOLFOX 10/16/2016 ? Cycle 5 FOLFOX 10/30/2016 ? Restaging CTs 11/10/2016-decreased wall thickness distal esophagus; stable borderline mediastinal lymphadenopathy; multiple liver metastases have completely resolved; stable scattered nonspecific tiny bilateral lung nodules ? Cycle 6 FOLFOX 11/13/2016 ? Cycle 7 FOLFOX 11/27/2016 ? Cycle 8 FOLFOX 12/11/2016 ? Cycle 9 FOLFOX 12/25/2016 ? Cycle 10 FOLFOX (oxaliplatin deleted)  01/08/2017 ? Initiation of maintenance therapy with 5-FU/leucovorin 02/06/2017 2. Diabetes  3. COPD  4. Hypertension  5. History of colon polyps  6. Port-A-Cath placement 08/30/2016  7. 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   Disposition:Justin [name] appears stable. Plan to proceed with 5-FU/leucovorin today as scheduled. He will undergo restaging CT scans 03/16/2017. We will see him in follow-up on 03/20/2017. He will contact the office in the interim with any problems.  Plan reviewed with Dr. Benay Spice.    Ned Card ANP/GNP-BC   03/06/2017  11:23 AM

## 2017-03-08 ENCOUNTER — Ambulatory Visit (HOSPITAL_BASED_OUTPATIENT_CLINIC_OR_DEPARTMENT_OTHER): Payer: Medicare Other

## 2017-03-08 VITALS — BP 140/80 | HR 72 | Temp 98.1°F | Resp 18

## 2017-03-08 DIAGNOSIS — C155 Malignant neoplasm of lower third of esophagus: Secondary | ICD-10-CM | POA: Diagnosis not present

## 2017-03-08 MED ORDER — SODIUM CHLORIDE 0.9% FLUSH
10.0000 mL | INTRAVENOUS | Status: DC | PRN
  Administered 2017-03-08: 10 mL
  Filled 2017-03-08: qty 10

## 2017-03-08 MED ORDER — HEPARIN SOD (PORK) LOCK FLUSH 100 UNIT/ML IV SOLN
500.0000 [IU] | Freq: Once | INTRAVENOUS | Status: AC | PRN
  Administered 2017-03-08: 500 [IU]
  Filled 2017-03-08: qty 5

## 2017-03-15 ENCOUNTER — Telehealth: Payer: Self-pay | Admitting: *Deleted

## 2017-03-15 ENCOUNTER — Other Ambulatory Visit: Payer: Self-pay | Admitting: *Deleted

## 2017-03-15 DIAGNOSIS — C155 Malignant neoplasm of lower third of esophagus: Secondary | ICD-10-CM

## 2017-03-15 NOTE — Telephone Encounter (Signed)
"  [name] [phone]) calling back, message left on nurse line was after 4:00 pm.  Cannot wait until next business day.    My Justin [name] needs to see Dr. Benay Spice or Lattie Haw tomorrow.  Monday, the 20th, a knot size of a quarter, visible bulging out right side of Justin [name]'s neck came up.  Tuesday night It moved to his right armpit.  Could see and feel this soft squishy knot to armpit Wednesday.  It's about gone now so it may have been swelling.    Pain to right side of neck down rt shoulder started Monday.  Rt arm started hurting Wednesday.  Says it's a continuous, achy pain.  Rates pain as six to seven on pain scale.  Advil not used today because he's been out all day for appointments.  Says Advil is not helping anyway.  Concerned because port-a-cath on the right side.  No problem with treatment or flush on 03-08-2017 when chemotherapy stopped.  Port-a-cath site, neck and rt arm do not have any redness, discoloration or warmth.  Swelling only to lower part of right arm.  Does not have a fever.  Just informed me he helped neighbor a neighbor unload wood and mowed the lawn on Monday."  Collaborative nurse to notify provider for review.    Notified daughter to expect a call with need for appointment with a [facility] APP.  "Justin [name] thinks his cell number is [phone]."  May need to contact who placed port-a-cath on 08-30-2016.  Keep appointment for Scans  at 8:30 tomorrow am.  Will call if needs to be seen after scans.    Discussed alternating cool and warm compresses to aches, pain relief outcome differences in some using Tylenol verses Advil if no medical conditions preventing Tylenol use.  Others obtain better relief alternating these two not exceeding Tylenol or Advil's maximum mg per 24 hours noted on OTC bottles.  NO further questions at this time.

## 2017-03-16 ENCOUNTER — Encounter: Payer: Self-pay | Admitting: Pharmacist

## 2017-03-16 ENCOUNTER — Encounter: Payer: Self-pay | Admitting: Oncology

## 2017-03-16 ENCOUNTER — Ambulatory Visit (HOSPITAL_COMMUNITY)
Admission: RE | Admit: 2017-03-16 | Discharge: 2017-03-16 | Disposition: A | Discharge status: Home / Self Care | Payer: Medicare Other | Source: Ambulatory Visit | Attending: Nurse Practitioner | Admitting: Nurse Practitioner

## 2017-03-16 ENCOUNTER — Ambulatory Visit (HOSPITAL_BASED_OUTPATIENT_CLINIC_OR_DEPARTMENT_OTHER): Payer: Medicare Other | Admitting: Oncology

## 2017-03-16 ENCOUNTER — Ambulatory Visit (HOSPITAL_COMMUNITY)
Admission: RE | Admit: 2017-03-16 | Discharge: 2017-03-16 | Disposition: A | Discharge status: Home / Self Care | Payer: Medicare Other | Source: Ambulatory Visit | Attending: Oncology | Admitting: Oncology

## 2017-03-16 VITALS — BP 133/77 | HR 97 | Temp 96.9°F | Resp 20 | Ht 70.0 in | Wt 194.8 lb

## 2017-03-16 DIAGNOSIS — N400 Enlarged prostate without lower urinary tract symptoms: Secondary | ICD-10-CM | POA: Diagnosis not present

## 2017-03-16 DIAGNOSIS — I82621 Acute embolism and thrombosis of deep veins of right upper extremity: Secondary | ICD-10-CM

## 2017-03-16 DIAGNOSIS — C155 Malignant neoplasm of lower third of esophagus: Secondary | ICD-10-CM | POA: Diagnosis not present

## 2017-03-16 DIAGNOSIS — M7989 Other specified soft tissue disorders: Secondary | ICD-10-CM | POA: Diagnosis not present

## 2017-03-16 DIAGNOSIS — R931 Abnormal findings on diagnostic imaging of heart and coronary circulation: Secondary | ICD-10-CM | POA: Insufficient documentation

## 2017-03-16 DIAGNOSIS — I82C11 Acute embolism and thrombosis of right internal jugular vein: Secondary | ICD-10-CM | POA: Diagnosis not present

## 2017-03-16 DIAGNOSIS — R938 Abnormal findings on diagnostic imaging of other specified body structures: Secondary | ICD-10-CM | POA: Diagnosis not present

## 2017-03-16 DIAGNOSIS — R918 Other nonspecific abnormal finding of lung field: Secondary | ICD-10-CM | POA: Diagnosis not present

## 2017-03-16 DIAGNOSIS — K5730 Diverticulosis of large intestine without perforation or abscess without bleeding: Secondary | ICD-10-CM | POA: Diagnosis not present

## 2017-03-16 DIAGNOSIS — R609 Edema, unspecified: Secondary | ICD-10-CM | POA: Diagnosis not present

## 2017-03-16 DIAGNOSIS — I2510 Atherosclerotic heart disease of native coronary artery without angina pectoris: Secondary | ICD-10-CM | POA: Diagnosis not present

## 2017-03-16 DIAGNOSIS — I82409 Acute embolism and thrombosis of unspecified deep veins of unspecified lower extremity: Secondary | ICD-10-CM | POA: Insufficient documentation

## 2017-03-16 DIAGNOSIS — C159 Malignant neoplasm of esophagus, unspecified: Secondary | ICD-10-CM | POA: Diagnosis not present

## 2017-03-16 MED ORDER — RIVAROXABAN (XARELTO) VTE STARTER PACK (15 & 20 MG): ORAL_TABLET | ORAL | 0 refills | Status: DC

## 2017-03-16 MED ORDER — IOPAMIDOL (ISOVUE-300) INJECTION 61%
100.0000 mL | Freq: Once | INTRAVENOUS | Status: AC | PRN
  Administered 2017-03-16: 100 mL via INTRAVENOUS

## 2017-03-16 MED ORDER — IOPAMIDOL (ISOVUE-300) INJECTION 61%
INTRAVENOUS | Status: AC
  Filled 2017-03-16: qty 100

## 2017-03-16 NOTE — Assessment & Plan Note (Signed)
This is a 66 year old gentleman currently being treated for esophageal cancer. Patient presents today with a right neck, axilla, and arm swelling. 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   Stat Doppler ultrasound of the right upper extremity was obtained. This confirmed a clot in the axillary veins as well as the internal jugular vein.  Patient was seen with Dr. Benay Spice. Recommend treatment with anticoagulation including Xarelto. Discussed risk of bleedingas a side effect with the patient. The patient was given a starter pack. We will talk with the pharmacist next week to get the subsequent prescriptions ordered and covered under insurance.  Plans for the patient follow-up next week to review his restaging CT scan results and discuss further treatment.

## 2017-03-16 NOTE — Telephone Encounter (Signed)
Reviewed call with Dr. Benay Spice: Order received to add CT neck to imaging. Pt will be worked in to see APP after CT. Otila Kluver in radiology will make pt aware.

## 2017-03-16 NOTE — Progress Notes (Signed)
Oral Chemotherapy Pharmacist Encounter  Dispensed samples to patient:  Medication: Xarelto starter pack for VTE Instructions: Take 15mg  tablet, 1 tablet by mouth twice daily for 21 days, then transition to 20mg  tablet, 1 tablet by mouth once daily with largest meal of the day. Quantity dispensed: 15mg : 42 tablets; 20mg : 9 tablets Days supply: 30 Manufacturer: [employer]  Lot: [id number] Exp: 07/19  Johny Drilling, PharmD, BCPS, BCOP 03/16/2017 4:25 PM Oral Oncology Clinic [phone]

## 2017-03-16 NOTE — Progress Notes (Signed)
[facility] up:    Justin [name], [address]  [place] [address]   DIAGNOSIS: Cancer Staging Cancer of distal third of esophagus [facility]) Staging form: Esophagus - Adenocarcinoma, AJCC 8th Edition - Clinical: Stage IVB (cTX, cNX, cM1) - Signed by Ladell Pier, MD on 08/25/2016   SUMMARY OF ONCOLOGIC HISTORY:  No history exists.    CURRENT THERAPY: maintenance therapy with 5-FU/leucovorin  INTERVAL HISTORY: Justin [name] 67 y.o. male returns for a work in visit with his wife and daughter. We received a call leg yesterday after new the patient has swelling in his right neck as well as in his axilla. Patient requests to be seen for evaluation. The patient had restaging CT scans performed this morning. A CT of the neck was added on stat to evaluate his neck swelling. Patient reports right side of the neck has been swollen for approximately 3 days. He has noticed some pain in the right side of his neck as well as in the shoulder and under his arm. Right arm is also swollen. He is not noticing any swelling in his legs. Denies shortness of breath or cough.   Patient Active Problem List   Diagnosis Date Noted  . Deep vein thrombosis (DVT) ([place]) 03/16/2017  . Neuropathy due to chemotherapeutic drug ([place]) 02/26/2017  . Port catheter in place 09/18/2016  . Cancer of distal third of esophagus ([place]) 08/25/2016  . Goals of care, counseling/discussion 08/25/2016  . 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 History of renal stone 01/28/2016  . RLS (restless legs syndrome) 09/30/2015  . Arthritis 12/20/2012  . Current smoker 12/20/2012  . Type 2 diabetes with complication ([place]) 60/45/4098  . Hypertension associated with diabetes ([place]) 12/02/2010  . Hyperlipidemia associated with type 2 diabetes mellitus ([place]) 12/02/2010  . Allergic rhinitis 12/02/2010  . Diverticulosis of colon 12/02/2010  . BPH (benign  prostatic hyperplasia) 12/02/2010  . Hx of colonic polyps 12/02/2010    has No Known Allergies.  MEDICAL HISTORY: Past Medical History:  Diagnosis Date  . Allergy   . Cancer ([place])   . Cataract   . Diabetes mellitus   . Headache   . History of kidney stones   . Hx of colonic polyps    pre-cancerous  . Hypercholesterolemia   . Hypertension     SURGICAL HISTORY: Past Surgical History:  Procedure Laterality Date  . COLONOSCOPY  [id number]  . ESOPHAGOGASTRODUODENOSCOPY (EGD) WITH PROPOFOL N/A 08/18/2016   Procedure: ESOPHAGOGASTRODUODENOSCOPY (EGD) WITH PROPOFOL;  Surgeon: Carol Ada, MD;  Location: [place] [facility];  Service: Endoscopy;  Laterality: N/A;  . IR GENERIC HISTORICAL  08/30/2016   IR US GUIDE VASC ACCESS RIGHT 08/30/2016 Corrie Mckusick, DO [facility]  . IR GENERIC HISTORICAL  08/30/2016   IR FLUORO GUIDE PORT INSERTION RIGHT 08/30/2016 Corrie Mckusick, DO [facility]    SOCIAL HISTORY: Social History   Social History  . Marital status: Married    Spouse name: N/A  . Number of children: N/A  . Years of education: N/A   Occupational History  . Not on file.   Social History Main Topics  . Smoking status: Current Every Day Smoker    Packs/day: 1.00    Years: 50.00    Types: Cigarettes  . Smokeless tobacco: Never Used  . Alcohol use No  . Drug use: No  . Sexual activity: Yes   Other Topics Concern  . Not on file  Social History Narrative  . No narrative on file    FAMILY HISTORY: Family History  Problem Relation Age of Onset  . Stomach cancer Father   . Breast cancer Sister   . Liver cancer Brother     Review of Systems  Constitutional: Negative.   HENT:   Negative for sore throat and trouble swallowing.   Respiratory: Negative for chest tightness, cough, hemoptysis, shortness of breath and wheezing.   Cardiovascular: Negative for chest pain, leg swelling and palpitations.  Hematological: Negative.   Psychiatric/Behavioral: Negative.        PHYSICAL EXAMINATION  ECOG PERFORMANCE STATUS: 1 - Symptomatic but completely ambulatory  Vitals:   03/16/17 1011  BP: 133/77  Pulse: 97  Resp: 20  Temp: (!) 96.9 F (36.1 C)  SpO2: 98%    Physical Exam  Constitutional: He is oriented to person, place, and time and well-developed, well-nourished, and in no distress. No distress.  HENT:  Head: Normocephalic and atraumatic.  Neck: No JVD present.  Right neck swelling with distention of the veins. No discrete masses.  Cardiovascular: Normal rate, regular rhythm, normal heart sounds and intact distal pulses.   Pulmonary/Chest: Breath sounds normal. No stridor. No respiratory distress. He has no wheezes. He has no rales. He exhibits no tenderness.  Port-A-Cath with engorged veins surrounding it. Swelling and fullness in the right axilla.  Musculoskeletal: He exhibits edema.  1+ edema noted to the right arm.  Lymphadenopathy:    He has no cervical adenopathy.  Neurological: He is alert and oriented to person, place, and time. Gait normal.  Skin: Skin is warm and dry. No rash noted. He is not diaphoretic. No pallor.  Psychiatric: Mood, memory, affect and judgment normal.  Vitals reviewed.   LABORATORY DATA:  CBC    Component Value Date/Time   WBC 12.2 (H) 03/06/2017 0959   WBC 15.5 (H) 08/30/2016 1158   RBC 4.88 03/06/2017 0959   RBC 5.55 08/30/2016 1158   HGB 15.5 03/06/2017 0959   HCT 45.9 03/06/2017 0959   PLT 166 03/06/2017 0959   MCV 94.0 03/06/2017 0959   MCH 31.8 03/06/2017 0959   MCH 30.8 08/30/2016 1158   MCHC 33.8 03/06/2017 0959   MCHC 34.9 08/30/2016 1158   RDW 14.8 (H) 03/06/2017 0959   LYMPHSABS 2.1 03/06/2017 0959   MONOABS 1.0 (H) 03/06/2017 0959   EOSABS 0.1 03/06/2017 0959   BASOSABS 0.1 03/06/2017 0959    CMP     Component Value Date/Time   NA 138 03/06/2017 0959   K 3.7 03/06/2017 0959   CL 104 08/30/2016 1158   CO2 23 03/06/2017 0959   GLUCOSE 160 (H) 03/06/2017 0959   BUN 10.1  03/06/2017 0959   CREATININE 0.9 03/06/2017 0959   CALCIUM 9.8 03/06/2017 0959   PROT 7.8 03/06/2017 0959   ALBUMIN 3.7 03/06/2017 0959   AST 20 03/06/2017 0959   ALT 20 03/06/2017 0959   ALKPHOS 105 03/06/2017 0959   BILITOT 0.45 03/06/2017 0959   GFRNONAA >60 08/30/2016 1158   GFRAA >60 08/30/2016 1158   RADIOGRAPHIC STUDIES:  Ct Soft Tissue Neck W Contrast  Result Date: 03/16/2017 CLINICAL DATA:  Restaging esophageal cancer EXAM: CT NECK WITH CONTRAST TECHNIQUE: Multidetector CT imaging of the neck was performed using the standard protocol following the bolus administration of intravenous contrast. CONTRAST:  145mL ISOVUE-300 IOPAMIDOL (ISOVUE-300) INJECTION 61% COMPARISON:  08/18/2016 FINDINGS: Pharynx and larynx: Normal Salivary glands: Normal appearance of the submandibular glands. Multiple well-circumscribed enhancing foci  within the parotid glands, largest on the left measuring 11 mm. These could be lymph nodes or were thin tumors. Thyroid: Normal Lymph nodes: Numerous small lymph nodes in the right supraclavicular region are suspicious for metastatic disease. Vascular: Carotid bifurcation atherosclerosis bilaterally without visible flow limiting stenosis. Right internal jugular central line in place. Some swelling of the right side of the neck. Cannot rule out right internal jugular thrombosis. Limited intracranial: Negative Visualized orbits: Not included Mastoids and visualized paranasal sinuses: Clear Skeleton: Ordinary cervical spondylosis. Upper chest: See results of chest CT Other: Nonspecific mild edema in the retropharyngeal/ prevertebral space. This could be secondary to the right internal jugular suspected thrombosis or could relate to inflammatory disease or radiation. IMPRESSION: Numerous small supraclavicular lymph nodes on the right which are nonspecific but could be involved with metastatic disease. Right internal jugular central line in place. Suspicion of right internal  jugular thrombosis. Retropharyngeal/prevertebral edema which could be secondary to the venous thrombosis, inflammatory disease or radiation change. Electronically Signed   By: Nelson Chimes M.D.   On: 03/16/2017 10:30   Ct Chest W Contrast  Result Date: 03/16/2017 CLINICAL DATA:  Distal esophageal cancer restaging. Chemotherapy in progress. EXAM: CT CHEST, ABDOMEN, AND PELVIS WITH CONTRAST TECHNIQUE: Multidetector CT imaging of the chest, abdomen and pelvis was performed following the standard protocol during bolus administration of intravenous contrast. CONTRAST:  158mL ISOVUE-300 IOPAMIDOL (ISOVUE-300) INJECTION 61% COMPARISON:  11/10/2016 FINDINGS: CT CHEST FINDINGS Cardiovascular: Expanded right subclavian and axillary veins with surrounding inflammatory stranding suspicious for acute right upper extremity DVT. Possible involvement of the right internal jugular vein as well. The right IJ Port-A-Cath tip terminates in the SVC. Secondary subcutaneous edema along the right upper chest. Questionable subsegmental DVT medially in the right lower lobe on image 102/4, today's exam was not performed as a pulmonary embolus CTA and accordingly diagnostic accuracy for pulmonary embolus is reduced. Coronary, aortic arch, and branch vessel atherosclerotic vascular disease. Mediastinum/Nodes: Right lower paratracheal node 0.9 cm in short axis on image 25/2, formerly 10 mm. Right supraclavicular node 1.0 cm in short axis on image 5/2, previously not well seen. Other borderline enlarged supraclavicular nodes are present on the right, and some of these may be reactive given the local DVT. Partially calcified AP window lymph node 1.0 cm in short axis on image 26/2, formerly 1.1 cm. Right hilar node 0.8 cm in short axis on image 31/2, stable. Currently no obvious esophageal mass is observed. Lungs/Pleura: Emphysema. Scattered subpleural interstitial accentuation. Stable 4 mm right upper lobe pulmonary nodule on image 42/6.  Bilateral airway thickening noted with bilateral lower lobe bronchial plugging. Is also some airway plugging in the lingula. Densely calcified 4 mm nodule in the left upper lobe along the major fissure, image 41/6. There is some atelectasis in the lingula. No new pulmonary nodules. Musculoskeletal: Unremarkable CT ABDOMEN PELVIS FINDINGS Hepatobiliary: Subtle and questionable hypodensity measuring about 8 mm in diameter in the dome of segment 2 of the liver, image 52/2, not seen on prior exams, and questionable on today's exam. Gallbladder unremarkable. Pancreas: Unremarkable Spleen: Unremarkable Adrenals/Urinary Tract: Prominent median lobe of the prostate gland indents the bladder base. Kidneys and adrenal glands unremarkable. Stomach/Bowel: Sigmoid colon diverticulosis. Otherwise unremarkable. Vascular/Lymphatic: Aortoiliac atherosclerotic vascular disease. No pathologic adenopathy. Reproductive: Prostatomegaly, the prostate gland measures 5.3 by 4.3 by 8.5 cm (volume = 100 cm^3). Other: No supplemental non-categorized findings. Musculoskeletal: Lumbar spondylosis and degenerative disc disease with likely impingement at the L3-4 and L4-5 levels. Dextroconvex  lumbar scoliosis. IMPRESSION: 1. Suspected acute DVT of the right subclavian and right axillary veins and probably the right internal jugular vein. This could be confirmed by sonography. Associated right thoracic body wall edema and likely reactive axillary and supraclavicular lymph nodes. Questionable subsegmental pulmonary embolus medially in the right lower lobe. 2. Currently no obvious esophageal mass is observed. 3. There is a very subtle 8 mm in diameter hypodensity along the dome of the lateral segment left hepatic lobe. This is so subtle that I am skeptical that it is a real finding, but may merit surveillance. Alternatively, if therapy would be changed, hepatic protocol MRI with and without contrast could be utilized to further assess this area. 4.  Several tiny pulmonary nodules are stable. 5. Other imaging findings of potential clinical significance: Aortic Atherosclerosis (ICD10-I70.0) and Emphysema (ICD10-J43.9). Coronary atherosclerosis. Prostatomegaly. Sigmoid colon diverticulosis. Lumbar spondylosis and degenerative disc disease likely causing impingement at L3-4 and L4-5. Critical Value/emergent results were called by telephone at the time of interpretation on 03/16/2017 at 12:17 pm to Dr. Julieanne [name], who verbally acknowledged these results. Electronically Signed   By: Van Clines M.D.   On: 03/16/2017 12:15   Ct Abdomen Pelvis W Contrast  Result Date: 03/16/2017 CLINICAL DATA:  Distal esophageal cancer restaging. Chemotherapy in progress. EXAM: CT CHEST, ABDOMEN, AND PELVIS WITH CONTRAST TECHNIQUE: Multidetector CT imaging of the chest, abdomen and pelvis was performed following the standard protocol during bolus administration of intravenous contrast. CONTRAST:  141mL ISOVUE-300 IOPAMIDOL (ISOVUE-300) INJECTION 61% COMPARISON:  11/10/2016 FINDINGS: CT CHEST FINDINGS Cardiovascular: Expanded right subclavian and axillary veins with surrounding inflammatory stranding suspicious for acute right upper extremity DVT. Possible involvement of the right internal jugular vein as well. The right IJ Port-A-Cath tip terminates in the SVC. Secondary subcutaneous edema along the right upper chest. Questionable subsegmental DVT medially in the right lower lobe on image 102/4, today's exam was not performed as a pulmonary embolus CTA and accordingly diagnostic accuracy for pulmonary embolus is reduced. Coronary, aortic arch, and branch vessel atherosclerotic vascular disease. Mediastinum/Nodes: Right lower paratracheal node 0.9 cm in short axis on image 25/2, formerly 10 mm. Right supraclavicular node 1.0 cm in short axis on image 5/2, previously not well seen. Other borderline enlarged supraclavicular nodes are present on the right, and some of these  may be reactive given the local DVT. Partially calcified AP window lymph node 1.0 cm in short axis on image 26/2, formerly 1.1 cm. Right hilar node 0.8 cm in short axis on image 31/2, stable. Currently no obvious esophageal mass is observed. Lungs/Pleura: Emphysema. Scattered subpleural interstitial accentuation. Stable 4 mm right upper lobe pulmonary nodule on image 42/6. Bilateral airway thickening noted with bilateral lower lobe bronchial plugging. Is also some airway plugging in the lingula. Densely calcified 4 mm nodule in the left upper lobe along the major fissure, image 41/6. There is some atelectasis in the lingula. No new pulmonary nodules. Musculoskeletal: Unremarkable CT ABDOMEN PELVIS FINDINGS Hepatobiliary: Subtle and questionable hypodensity measuring about 8 mm in diameter in the dome of segment 2 of the liver, image 52/2, not seen on prior exams, and questionable on today's exam. Gallbladder unremarkable. Pancreas: Unremarkable Spleen: Unremarkable Adrenals/Urinary Tract: Prominent median lobe of the prostate gland indents the bladder base. Kidneys and adrenal glands unremarkable. Stomach/Bowel: Sigmoid colon diverticulosis. Otherwise unremarkable. Vascular/Lymphatic: Aortoiliac atherosclerotic vascular disease. No pathologic adenopathy. Reproductive: Prostatomegaly, the prostate gland measures 5.3 by 4.3 by 8.5 cm (volume = 100 cm^3). Other: No supplemental non-categorized findings.  Musculoskeletal: Lumbar spondylosis and degenerative disc disease with likely impingement at the L3-4 and L4-5 levels. Dextroconvex lumbar scoliosis. IMPRESSION: 1. Suspected acute DVT of the right subclavian and right axillary veins and probably the right internal jugular vein. This could be confirmed by sonography. Associated right thoracic body wall edema and likely reactive axillary and supraclavicular lymph nodes. Questionable subsegmental pulmonary embolus medially in the right lower lobe. 2. Currently no obvious  esophageal mass is observed. 3. There is a very subtle 8 mm in diameter hypodensity along the dome of the lateral segment left hepatic lobe. This is so subtle that I am skeptical that it is a real finding, but may merit surveillance. Alternatively, if therapy would be changed, hepatic protocol MRI with and without contrast could be utilized to further assess this area. 4. Several tiny pulmonary nodules are stable. 5. Other imaging findings of potential clinical significance: Aortic Atherosclerosis (ICD10-I70.0) and Emphysema (ICD10-J43.9). Coronary atherosclerosis. Prostatomegaly. Sigmoid colon diverticulosis. Lumbar spondylosis and degenerative disc disease likely causing impingement at L3-4 and L4-5. Critical Value/emergent results were called by telephone at the time of interpretation on 03/16/2017 at 12:17 pm to Dr. Julieanne [name], who verbally acknowledged these results. Electronically Signed   By: Van Clines M.D.   On: 03/16/2017 12:15   ASSESSMENT and THERAPY PLAN:   Deep vein thrombosis (DVT) [facility]) This is a 66 year old gentleman currently being treated for esophageal cancer. Patient presents today with a right neck, axilla, and arm swelling. 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   Stat Doppler ultrasound of the right upper extremity was obtained. This confirmed a clot in the axillary veins as well as the internal jugular vein.  Patient was seen with Dr. Benay Spice. Recommend treatment with anticoagulation including Xarelto. Discussed risk of bleedingas a side effect with the patient. The patient was given a starter pack. We will talk with the pharmacist next week to get the subsequent prescriptions ordered and covered under insurance.  Plans for the patient follow-up next week to review his restaging CT scan results and discuss further treatment.   Orders Placed This Encounter  Procedures  . US Venous Img Upper Uni Right    Standing Status:   Future     Standing Expiration Date:   05/16/2018    Order Specific Question:   Reason for Exam (SYMPTOM  OR DIAGNOSIS REQUIRED)    Answer:   Right arm and neck swelling. R/O DVT.    Order Specific Question:   Preferred imaging location?    Answer:   [facility]    All questions were answered. The patient knows to call the clinic with any problems, questions or concerns. We can certainly see the patient much sooner if necessary.  Justin Bussing, NP 03/16/2017  This was a shared visit with Justin [name]. Mr. Olen Pel was interviewed and examined. He has been diagnosed with a Port-A-Cath related deep vein thrombosis. There may be an associated subclinical pulmonary embolism. He was started on anticoagulation therapy with Xarelto. He will return for an office visit as scheduled on 03/20/2017 to discuss the restaging CT scans and treatment plans. 25 minutes were spent with the patient today. The majority of the time was used for counseling and coordination of care.  Justin [name], M.D.

## 2017-03-16 NOTE — Progress Notes (Signed)
VASCULAR LAB PRELIMINARY  PRELIMINARY  PRELIMINARY  PRELIMINARY  Right upper extremity venous duplex completed.    Preliminary report:  Right :  DVT noted in the internal jugular, subclavian, and axillary veins. No evidence of superficial thrombosis . No propagation of the DVT to the left subclavian.  Dayon Witt, [place], RVS 03/16/2017, 12:08 PM

## 2017-03-19 ENCOUNTER — Telehealth: Payer: Self-pay | Admitting: Oncology

## 2017-03-19 ENCOUNTER — Other Ambulatory Visit: Payer: Self-pay

## 2017-03-19 DIAGNOSIS — C155 Malignant neoplasm of lower third of esophagus: Secondary | ICD-10-CM

## 2017-03-19 NOTE — Telephone Encounter (Signed)
No 8/24 los  

## 2017-03-20 ENCOUNTER — Ambulatory Visit: Payer: Medicare Other

## 2017-03-20 ENCOUNTER — Ambulatory Visit (HOSPITAL_BASED_OUTPATIENT_CLINIC_OR_DEPARTMENT_OTHER): Payer: Medicare Other

## 2017-03-20 ENCOUNTER — Other Ambulatory Visit (HOSPITAL_BASED_OUTPATIENT_CLINIC_OR_DEPARTMENT_OTHER): Payer: Medicare Other

## 2017-03-20 ENCOUNTER — Ambulatory Visit (HOSPITAL_BASED_OUTPATIENT_CLINIC_OR_DEPARTMENT_OTHER): Payer: Medicare Other | Admitting: Oncology

## 2017-03-20 ENCOUNTER — Other Ambulatory Visit: Payer: Self-pay

## 2017-03-20 ENCOUNTER — Telehealth: Payer: Self-pay | Admitting: Nurse Practitioner

## 2017-03-20 VITALS — BP 136/77 | HR 89 | Temp 97.8°F | Resp 18 | Ht 70.0 in | Wt 197.6 lb

## 2017-03-20 DIAGNOSIS — Z5111 Encounter for antineoplastic chemotherapy: Secondary | ICD-10-CM | POA: Diagnosis not present

## 2017-03-20 DIAGNOSIS — C155 Malignant neoplasm of lower third of esophagus: Secondary | ICD-10-CM | POA: Diagnosis not present

## 2017-03-20 DIAGNOSIS — I82621 Acute embolism and thrombosis of deep veins of right upper extremity: Secondary | ICD-10-CM | POA: Diagnosis not present

## 2017-03-20 DIAGNOSIS — C787 Secondary malignant neoplasm of liver and intrahepatic bile duct: Secondary | ICD-10-CM

## 2017-03-20 LAB — COMPREHENSIVE METABOLIC PANEL
ALT: 28 U/L (ref 0–55)
AST: 25 U/L (ref 5–34)
Albumin: 3.4 g/dL — ABNORMAL LOW (ref 3.5–5.0)
Alkaline Phosphatase: 99 U/L (ref 40–150)
Anion Gap: 7 mEq/L (ref 3–11)
BILIRUBIN TOTAL: 0.54 mg/dL (ref 0.20–1.20)
BUN: 9.2 mg/dL (ref 7.0–26.0)
CO2: 24 meq/L (ref 22–29)
Calcium: 9.7 mg/dL (ref 8.4–10.4)
Chloride: 106 mEq/L (ref 98–109)
Creatinine: 0.8 mg/dL (ref 0.7–1.3)
GLUCOSE: 143 mg/dL — AB (ref 70–140)
POTASSIUM: 3.6 meq/L (ref 3.5–5.1)
SODIUM: 137 meq/L (ref 136–145)
TOTAL PROTEIN: 7.6 g/dL (ref 6.4–8.3)

## 2017-03-20 LAB — CBC & DIFF AND RETIC
BASO%: 0.2 % (ref 0.0–2.0)
Basophils Absolute: 0 10*3/uL (ref 0.0–0.1)
EOS%: 1.1 % (ref 0.0–7.0)
Eosinophils Absolute: 0.2 10*3/uL (ref 0.0–0.5)
HCT: 40.8 % (ref 38.4–49.9)
HGB: 13.9 g/dL (ref 13.0–17.1)
IMMATURE RETIC FRACT: 9.8 % (ref 3.00–10.60)
LYMPH#: 1.6 10*3/uL (ref 0.9–3.3)
LYMPH%: 10.9 % — AB (ref 14.0–49.0)
MCH: 31.7 pg (ref 27.2–33.4)
MCHC: 34.1 g/dL (ref 32.0–36.0)
MCV: 92.9 fL (ref 79.3–98.0)
MONO#: 1.2 10*3/uL — AB (ref 0.1–0.9)
MONO%: 8.6 % (ref 0.0–14.0)
NEUT%: 79.2 % — ABNORMAL HIGH (ref 39.0–75.0)
NEUTROS ABS: 11.3 10*3/uL — AB (ref 1.5–6.5)
Platelets: 196 10*3/uL (ref 140–400)
RBC: 4.39 10*6/uL (ref 4.20–5.82)
RDW: 14.4 % (ref 11.0–14.6)
RETIC %: 1.52 % (ref 0.80–1.80)
Retic Ct Abs: 66.73 10*3/uL (ref 34.80–93.90)
WBC: 14.3 10*3/uL — AB (ref 4.0–10.3)

## 2017-03-20 MED ORDER — FLUOROURACIL CHEMO INJECTION 2.5 GM/50ML
400.0000 mg/m2 | Freq: Once | INTRAVENOUS | Status: AC
  Administered 2017-03-20: 850 mg via INTRAVENOUS
  Filled 2017-03-20: qty 17

## 2017-03-20 MED ORDER — SODIUM CHLORIDE 0.9 % IV SOLN
Freq: Once | INTRAVENOUS | Status: AC
  Administered 2017-03-20: 11:00:00 via INTRAVENOUS

## 2017-03-20 MED ORDER — DEXTROSE 5 % IV SOLN
400.0000 mg/m2 | Freq: Once | INTRAVENOUS | Status: AC
  Administered 2017-03-20: 832 mg via INTRAVENOUS
  Filled 2017-03-20: qty 41.6

## 2017-03-20 MED ORDER — SODIUM CHLORIDE 0.9 % IV SOLN
2400.0000 mg/m2 | INTRAVENOUS | Status: DC
  Administered 2017-03-20: 5000 mg via INTRAVENOUS
  Filled 2017-03-20: qty 100

## 2017-03-20 NOTE — Progress Notes (Signed)
[place] OFFICE PROGRESS NOTE   Diagnosis: Esophagus cancer  INTERVAL HISTORY:   [name] returns as scheduled. He reports improvement in the right neck pain and swelling. No bleeding. No dysphagia. Neuropathy symptoms have improved.  Objective:  Vital signs in last 24 hours:  Blood pressure 136/77, pulse 89, temperature 97.8 F (36.6 C), temperature source Oral, resp. rate 18, height _0  (1.778 m), weight 197 lb 9.6 oz (89.6 kg), SpO2 97 %.    HEENT: No thrush or ulcers Resp: Lungs with distant breath sounds, no respiratory distress Cardio: Regular rate and rhythm GI: No hepatosplenomegaly Vascular: No leg edema, mild edema of the right arm, venous engorgement the right anterior chest-improved    Portacath/PICC-without erythema  Lab Results:  Lab Results  Component Value Date   WBC 14.3 (H) 03/20/2017   HGB 13.9 03/20/2017   HCT 40.8 03/20/2017   MCV 92.9 03/20/2017   PLT 196 03/20/2017   NEUTROABS 11.3 (H) 03/20/2017    CMP     Component Value Date/Time   NA 137 03/20/2017 0818   K 3.6 03/20/2017 0818   CL 104 08/30/2016 1158   CO2 24 03/20/2017 0818   GLUCOSE 143 (H) 03/20/2017 0818   BUN 9.2 03/20/2017 0818   CREATININE 0.8 03/20/2017 0818   CALCIUM 9.7 03/20/2017 0818   PROT 7.6 03/20/2017 0818   ALBUMIN 3.4 (L) 03/20/2017 0818   AST 25 03/20/2017 0818   ALT 28 03/20/2017 0818   ALKPHOS 99 03/20/2017 0818   BILITOT 0.54 03/20/2017 0818   GFRNONAA >60 08/30/2016 1158   GFRAA >60 08/30/2016 1158    Lab Results  Component Value Date   CEA1 4.07 03/06/2017    Lab Results  Component Value Date   INR 1.00 08/30/2016    Imaging:  Ct Soft Tissue Neck W Contrast  Result Date: 03/16/2017 CLINICAL DATA:  Restaging esophageal cancer EXAM: CT NECK WITH CONTRAST TECHNIQUE: Multidetector CT imaging of the neck was performed using the standard protocol following the bolus administration of intravenous contrast. CONTRAST:  168m  ISOVUE-300 IOPAMIDOL (ISOVUE-300) INJECTION 61% COMPARISON:  08/18/2016 FINDINGS: Pharynx and larynx: Normal Salivary glands: Normal appearance of the submandibular glands. Multiple well-circumscribed enhancing foci within the parotid glands, largest on the left measuring 11 mm. These could be lymph nodes or were thin tumors. Thyroid: Normal Lymph nodes: Numerous small lymph nodes in the right supraclavicular region are suspicious for metastatic disease. Vascular: Carotid bifurcation atherosclerosis bilaterally without visible flow limiting stenosis. Right internal jugular central line in place. Some swelling of the right side of the neck. Cannot rule out right internal jugular thrombosis. Limited intracranial: Negative Visualized orbits: Not included Mastoids and visualized paranasal sinuses: Clear Skeleton: Ordinary cervical spondylosis. Upper chest: See results of chest CT Other: Nonspecific mild edema in the retropharyngeal/ prevertebral space. This could be secondary to the right internal jugular suspected thrombosis or could relate to inflammatory disease or radiation. IMPRESSION: Numerous small supraclavicular lymph nodes on the right which are nonspecific but could be involved with metastatic disease. Right internal jugular central line in place. Suspicion of right internal jugular thrombosis. Retropharyngeal/prevertebral edema which could be secondary to the venous thrombosis, inflammatory disease or radiation change. Electronically Signed   By: MNelson ChimesM.D.   On: 03/16/2017 10:30   Ct Chest W Contrast  Result Date: 03/16/2017 CLINICAL DATA:  Distal esophageal cancer restaging. Chemotherapy in progress. EXAM: CT CHEST, ABDOMEN, AND PELVIS WITH CONTRAST TECHNIQUE: Multidetector CT imaging of the chest,  abdomen and pelvis was performed following the standard protocol during bolus administration of intravenous contrast. CONTRAST:  110m ISOVUE-300 IOPAMIDOL (ISOVUE-300) INJECTION 61% COMPARISON:   11/10/2016 FINDINGS: CT CHEST FINDINGS Cardiovascular: Expanded right subclavian and axillary veins with surrounding inflammatory stranding suspicious for acute right upper extremity DVT. Possible involvement of the right internal jugular vein as well. The right IJ Port-A-Cath tip terminates in the SVC. Secondary subcutaneous edema along the right upper chest. Questionable subsegmental DVT medially in the right lower lobe on image 102/4, today's exam was not performed as a pulmonary embolus CTA and accordingly diagnostic accuracy for pulmonary embolus is reduced. Coronary, aortic arch, and branch vessel atherosclerotic vascular disease. Mediastinum/Nodes: Right lower paratracheal node 0.9 cm in short axis on image 25/2, formerly 10 mm. Right supraclavicular node 1.0 cm in short axis on image 5/2, previously not well seen. Other borderline enlarged supraclavicular nodes are present on the right, and some of these may be reactive given the local DVT. Partially calcified AP window lymph node 1.0 cm in short axis on image 26/2, formerly 1.1 cm. Right hilar node 0.8 cm in short axis on image 31/2, stable. Currently no obvious esophageal mass is observed. Lungs/Pleura: Emphysema. Scattered subpleural interstitial accentuation. Stable 4 mm right upper lobe pulmonary nodule on image 42/6. Bilateral airway thickening noted with bilateral lower lobe bronchial plugging. Is also some airway plugging in the lingula. Densely calcified 4 mm nodule in the left upper lobe along the major fissure, image 41/6. There is some atelectasis in the lingula. No new pulmonary nodules. Musculoskeletal: Unremarkable CT ABDOMEN PELVIS FINDINGS Hepatobiliary: Subtle and questionable hypodensity measuring about 8 mm in diameter in the dome of segment 2 of the liver, image 52/2, not seen on prior exams, and questionable on today's exam. Gallbladder unremarkable. Pancreas: Unremarkable Spleen: Unremarkable Adrenals/Urinary Tract: Prominent median  lobe of the prostate gland indents the bladder base. Kidneys and adrenal glands unremarkable. Stomach/Bowel: Sigmoid colon diverticulosis. Otherwise unremarkable. Vascular/Lymphatic: Aortoiliac atherosclerotic vascular disease. No pathologic adenopathy. Reproductive: Prostatomegaly, the prostate gland measures 5.3 by 4.3 by 8.5 cm (volume = 100 cm^3). Other: No supplemental non-categorized findings. Musculoskeletal: Lumbar spondylosis and degenerative disc disease with likely impingement at the L3-4 and L4-5 levels. Dextroconvex lumbar scoliosis. IMPRESSION: 1. Suspected acute DVT of the right subclavian and right axillary veins and probably the right internal jugular vein. This could be confirmed by sonography. Associated right thoracic body wall edema and likely reactive axillary and supraclavicular lymph nodes. Questionable subsegmental pulmonary embolus medially in the right lower lobe. 2. Currently no obvious esophageal mass is observed. 3. There is a very subtle 8 mm in diameter hypodensity along the dome of the lateral segment left hepatic lobe. This is so subtle that I am skeptical that it is a real finding, but may merit surveillance. Alternatively, if therapy would be changed, hepatic protocol MRI with and without contrast could be utilized to further assess this area. 4. Several tiny pulmonary nodules are stable. 5. Other imaging findings of potential clinical significance: Aortic Atherosclerosis (ICD10-I70.0) and Emphysema (ICD10-J43.9). Coronary atherosclerosis. Prostatomegaly. Sigmoid colon diverticulosis. Lumbar spondylosis and degenerative disc disease likely causing impingement at L3-4 and L4-5. Critical Value/emergent results were called by telephone at the time of interpretation on 03/16/2017 at 12:17 pm to Dr. BJulieanne Manson who verbally acknowledged these results. Electronically Signed   By: WVan ClinesM.D.   On: 03/16/2017 12:15   Ct Abdomen Pelvis W Contrast  Result Date:  03/16/2017 CLINICAL DATA:  Distal esophageal cancer restaging.  Chemotherapy in progress. EXAM: CT CHEST, ABDOMEN, AND PELVIS WITH CONTRAST TECHNIQUE: Multidetector CT imaging of the chest, abdomen and pelvis was performed following the standard protocol during bolus administration of intravenous contrast. CONTRAST:  179m ISOVUE-300 IOPAMIDOL (ISOVUE-300) INJECTION 61% COMPARISON:  11/10/2016 FINDINGS: CT CHEST FINDINGS Cardiovascular: Expanded right subclavian and axillary veins with surrounding inflammatory stranding suspicious for acute right upper extremity DVT. Possible involvement of the right internal jugular vein as well. The right IJ Port-A-Cath tip terminates in the SVC. Secondary subcutaneous edema along the right upper chest. Questionable subsegmental DVT medially in the right lower lobe on image 102/4, today's exam was not performed as a pulmonary embolus CTA and accordingly diagnostic accuracy for pulmonary embolus is reduced. Coronary, aortic arch, and branch vessel atherosclerotic vascular disease. Mediastinum/Nodes: Right lower paratracheal node 0.9 cm in short axis on image 25/2, formerly 10 mm. Right supraclavicular node 1.0 cm in short axis on image 5/2, previously not well seen. Other borderline enlarged supraclavicular nodes are present on the right, and some of these may be reactive given the local DVT. Partially calcified AP window lymph node 1.0 cm in short axis on image 26/2, formerly 1.1 cm. Right hilar node 0.8 cm in short axis on image 31/2, stable. Currently no obvious esophageal mass is observed. Lungs/Pleura: Emphysema. Scattered subpleural interstitial accentuation. Stable 4 mm right upper lobe pulmonary nodule on image 42/6. Bilateral airway thickening noted with bilateral lower lobe bronchial plugging. Is also some airway plugging in the lingula. Densely calcified 4 mm nodule in the left upper lobe along the major fissure, image 41/6. There is some atelectasis in the lingula. No  new pulmonary nodules. Musculoskeletal: Unremarkable CT ABDOMEN PELVIS FINDINGS Hepatobiliary: Subtle and questionable hypodensity measuring about 8 mm in diameter in the dome of segment 2 of the liver, image 52/2, not seen on prior exams, and questionable on today's exam. Gallbladder unremarkable. Pancreas: Unremarkable Spleen: Unremarkable Adrenals/Urinary Tract: Prominent median lobe of the prostate gland indents the bladder base. Kidneys and adrenal glands unremarkable. Stomach/Bowel: Sigmoid colon diverticulosis. Otherwise unremarkable. Vascular/Lymphatic: Aortoiliac atherosclerotic vascular disease. No pathologic adenopathy. Reproductive: Prostatomegaly, the prostate gland measures 5.3 by 4.3 by 8.5 cm (volume = 100 cm^3). Other: No supplemental non-categorized findings. Musculoskeletal: Lumbar spondylosis and degenerative disc disease with likely impingement at the L3-4 and L4-5 levels. Dextroconvex lumbar scoliosis. IMPRESSION: 1. Suspected acute DVT of the right subclavian and right axillary veins and probably the right internal jugular vein. This could be confirmed by sonography. Associated right thoracic body wall edema and likely reactive axillary and supraclavicular lymph nodes. Questionable subsegmental pulmonary embolus medially in the right lower lobe. 2. Currently no obvious esophageal mass is observed. 3. There is a very subtle 8 mm in diameter hypodensity along the dome of the lateral segment left hepatic lobe. This is so subtle that I am skeptical that it is a real finding, but may merit surveillance. Alternatively, if therapy would be changed, hepatic protocol MRI with and without contrast could be utilized to further assess this area. 4. Several tiny pulmonary nodules are stable. 5. Other imaging findings of potential clinical significance: Aortic Atherosclerosis (ICD10-I70.0) and Emphysema (ICD10-J43.9). Coronary atherosclerosis. Prostatomegaly. Sigmoid colon diverticulosis. Lumbar spondylosis  and degenerative disc disease likely causing impingement at L3-4 and L4-5. Critical Value/emergent results were called by telephone at the time of interpretation on 03/16/2017 at 12:17 pm to Dr. BJulieanne Manson who verbally acknowledged these results. Electronically Signed   By: WVan ClinesM.D.   On: 03/16/2017 12:15  Medications: I have reviewed the patient's current medications.  Assessment/Plan: 1. 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 Staging CTs 08/18/2016 with nonspecific pulmonary nodules, liver metastases, borderline thoracic and gastrohepatic ligament lymphadenopathy ? Cycle 1 FOLFOX 09/04/2016 ? Cycle 2 FOLFOX 09/18/2016 ? Cycle 3 FOLFOX 10/02/2016 ? Cycle 4 FOLFOX 10/16/2016 ? Cycle 5 FOLFOX 10/30/2016 ? Restaging CTs 11/10/2016-decreased wall thickness distal esophagus; stable borderline mediastinal lymphadenopathy; multiple liver metastases have completely resolved; stable scattered nonspecific tiny bilateral lung nodules ? Cycle 6 FOLFOX 11/13/2016 ? Cycle 7 FOLFOX 11/27/2016 ? Cycle 8 FOLFOX 12/11/2016 ? Cycle 9 FOLFOX 12/25/2016 ? Cycle 10 FOLFOX (oxaliplatin deleted) 01/08/2017 ? Initiation of maintenance therapy with 5-FU/leucovorin 02/06/2017 ? CTs 03/16/2017-stable pulmonary nodules, no esophagus mass, 1 subtle liver lesion ? Maintenance therapy continued with 5-FU/leucovorin every 3 weeks beginning 03/20/2017 2. Diabetes  3. COPD  4. Hypertension  5. History of colon polyps  6. Port-A-Cath placement 08/30/2016  7. 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  8.  Right upper extremity deep vein thrombosis, Port-A-Cath related a 24 2018 started Xarelto 03/16/2017, possible subsegmental right lower lobe pulmonary embolus on CT 03/16/2017     Disposition:  Mr. Doughtie appears unchanged. The restaging CT reveals no evidence of  disease progression. I recommend he continue maintenance 5 fluorouracil. The plan is to continue 5 fluorouracil every 3 weeks. He is unable to obtain Xeloda.  He has been diagnosed with a right upper extremity Port-A-Cath related DVT and possible pulmonary embolism. He will continue Xarelto anticoagulation.  Mr. Heyward indicated he does not wish to undergo resuscitation or life support measures. He will make a living will. He will be placed on a no CODE BLUE status.  I reviewed the restaging CT images.  25 minutes were spent with the patient today. The majority of the time was used for counseling and coordination of care.  Donneta Romberg, MD  03/20/2017  9:37 AM

## 2017-03-20 NOTE — Telephone Encounter (Signed)
Gave patient avs and calendar for September appointment

## 2017-03-20 NOTE — Patient Instructions (Signed)
[facility] Discharge Instructions for Patients Receiving Chemotherapy  Today you received the following chemotherapy agents irinotecan/florouracil/leucovorin   To help prevent nausea and vomiting after your treatment, we encourage you to take your nausea medication as directed  If you develop nausea and vomiting that is not controlled by your nausea medication, call the clinic.   BELOW ARE SYMPTOMS THAT SHOULD BE REPORTED IMMEDIATELY:  *FEVER GREATER THAN 100.5 F  *CHILLS WITH OR WITHOUT FEVER  NAUSEA AND VOMITING THAT IS NOT CONTROLLED WITH YOUR NAUSEA MEDICATION  *UNUSUAL SHORTNESS OF BREATH  *UNUSUAL BRUISING OR BLEEDING  TENDERNESS IN MOUTH AND THROAT WITH OR WITHOUT PRESENCE OF ULCERS  *URINARY PROBLEMS  *BOWEL PROBLEMS  UNUSUAL RASH Items with * indicate a potential emergency and should be followed up as soon as possible.  Feel free to call the clinic you have any questions or concerns. The clinic phone number is [phone].

## 2017-03-22 ENCOUNTER — Ambulatory Visit (HOSPITAL_BASED_OUTPATIENT_CLINIC_OR_DEPARTMENT_OTHER): Payer: Medicare Other

## 2017-03-22 VITALS — BP 137/72 | HR 76 | Temp 97.9°F | Resp 18

## 2017-03-22 DIAGNOSIS — C155 Malignant neoplasm of lower third of esophagus: Secondary | ICD-10-CM | POA: Diagnosis not present

## 2017-03-22 MED ORDER — SODIUM CHLORIDE 0.9% FLUSH
10.0000 mL | INTRAVENOUS | Status: DC | PRN
  Administered 2017-03-22: 10 mL
  Filled 2017-03-22: qty 10

## 2017-03-22 MED ORDER — HEPARIN SOD (PORK) LOCK FLUSH 100 UNIT/ML IV SOLN
500.0000 [IU] | Freq: Once | INTRAVENOUS | Status: AC | PRN
Start: 2017-03-22 — End: 2017-03-22
  Administered 2017-03-22: 500 [IU]
  Filled 2017-03-22: qty 5

## 2017-04-03 ENCOUNTER — Ambulatory Visit: Payer: Medicare Other

## 2017-04-03 ENCOUNTER — Other Ambulatory Visit: Payer: Medicare Other

## 2017-04-03 ENCOUNTER — Ambulatory Visit: Payer: Medicare Other | Admitting: Nurse Practitioner

## 2017-04-04 DIAGNOSIS — C155 Malignant neoplasm of lower third of esophagus: Secondary | ICD-10-CM | POA: Diagnosis not present

## 2017-04-06 ENCOUNTER — Other Ambulatory Visit: Payer: Self-pay | Admitting: Oncology

## 2017-04-10 ENCOUNTER — Ambulatory Visit (HOSPITAL_BASED_OUTPATIENT_CLINIC_OR_DEPARTMENT_OTHER): Payer: Medicare Other

## 2017-04-10 ENCOUNTER — Ambulatory Visit (HOSPITAL_BASED_OUTPATIENT_CLINIC_OR_DEPARTMENT_OTHER): Payer: Medicare Other | Admitting: Nurse Practitioner

## 2017-04-10 ENCOUNTER — Ambulatory Visit: Payer: Medicare Other

## 2017-04-10 ENCOUNTER — Telehealth: Payer: Self-pay | Admitting: Nurse Practitioner

## 2017-04-10 ENCOUNTER — Other Ambulatory Visit (HOSPITAL_BASED_OUTPATIENT_CLINIC_OR_DEPARTMENT_OTHER): Payer: Medicare Other

## 2017-04-10 ENCOUNTER — Other Ambulatory Visit: Payer: Medicare Other

## 2017-04-10 VITALS — BP 145/75 | HR 87 | Temp 98.0°F | Resp 18 | Ht 70.0 in | Wt 195.4 lb

## 2017-04-10 DIAGNOSIS — E119 Type 2 diabetes mellitus without complications: Secondary | ICD-10-CM

## 2017-04-10 DIAGNOSIS — Z95828 Presence of other vascular implants and grafts: Secondary | ICD-10-CM

## 2017-04-10 DIAGNOSIS — C155 Malignant neoplasm of lower third of esophagus: Secondary | ICD-10-CM

## 2017-04-10 DIAGNOSIS — I10 Essential (primary) hypertension: Secondary | ICD-10-CM | POA: Diagnosis not present

## 2017-04-10 DIAGNOSIS — Z5111 Encounter for antineoplastic chemotherapy: Secondary | ICD-10-CM | POA: Diagnosis not present

## 2017-04-10 LAB — CBC WITH DIFFERENTIAL/PLATELET
BASO%: 1 % (ref 0.0–2.0)
BASOS ABS: 0.1 10*3/uL (ref 0.0–0.1)
EOS ABS: 0.2 10*3/uL (ref 0.0–0.5)
EOS%: 1.5 % (ref 0.0–7.0)
HCT: 45 % (ref 38.4–49.9)
HEMOGLOBIN: 15.2 g/dL (ref 13.0–17.1)
LYMPH%: 17.6 % (ref 14.0–49.0)
MCH: 31.5 pg (ref 27.2–33.4)
MCHC: 33.9 g/dL (ref 32.0–36.0)
MCV: 93.1 fL (ref 79.3–98.0)
MONO#: 1.2 10*3/uL — ABNORMAL HIGH (ref 0.1–0.9)
MONO%: 9.3 % (ref 0.0–14.0)
NEUT#: 8.9 10*3/uL — ABNORMAL HIGH (ref 1.5–6.5)
NEUT%: 70.6 % (ref 39.0–75.0)
Platelets: 204 10*3/uL (ref 140–400)
RBC: 4.83 10*6/uL (ref 4.20–5.82)
RDW: 15.6 % — ABNORMAL HIGH (ref 11.0–14.6)
WBC: 12.6 10*3/uL — ABNORMAL HIGH (ref 4.0–10.3)
lymph#: 2.2 10*3/uL (ref 0.9–3.3)

## 2017-04-10 LAB — TECHNOLOGIST REVIEW

## 2017-04-10 MED ORDER — SODIUM CHLORIDE 0.9 % IV SOLN
400.0000 mg/m2 | Freq: Once | INTRAVENOUS | Status: AC
  Administered 2017-04-10: 832 mg via INTRAVENOUS
  Filled 2017-04-10: qty 41.6

## 2017-04-10 MED ORDER — RIVAROXABAN 20 MG PO TABS: 20.0000 mg | ORAL_TABLET | Freq: Every day | ORAL | 5 refills | Status: DC

## 2017-04-10 MED ORDER — SODIUM CHLORIDE 0.9 % IV SOLN
2400.0000 mg/m2 | INTRAVENOUS | Status: DC
  Administered 2017-04-10: 5000 mg via INTRAVENOUS
  Filled 2017-04-10: qty 100

## 2017-04-10 MED ORDER — SODIUM CHLORIDE 0.9% FLUSH
10.0000 mL | INTRAVENOUS | Status: DC | PRN
  Administered 2017-04-10: 10 mL via INTRAVENOUS
  Filled 2017-04-10: qty 10

## 2017-04-10 MED ORDER — FLUOROURACIL CHEMO INJECTION 2.5 GM/50ML
400.0000 mg/m2 | Freq: Once | INTRAVENOUS | Status: AC
  Administered 2017-04-10: 850 mg via INTRAVENOUS
  Filled 2017-04-10: qty 17

## 2017-04-10 MED ORDER — SODIUM CHLORIDE 0.9 % IV SOLN
Freq: Once | INTRAVENOUS | Status: AC
  Administered 2017-04-10: 10:00:00 via INTRAVENOUS

## 2017-04-10 NOTE — Patient Instructions (Addendum)
[facility] Discharge Instructions for Patients Receiving Chemotherapy  Today you received the following chemotherapy agents: Leucovorin/5 FU To help prevent nausea and vomiting after your treatment, we encourage you to take your nausea medication as prescribed.   If you develop nausea and vomiting that is not controlled by your nausea medication, call the clinic.   BELOW ARE SYMPTOMS THAT SHOULD BE REPORTED IMMEDIATELY:  *FEVER GREATER THAN 100.5 F  *CHILLS WITH OR WITHOUT FEVER  NAUSEA AND VOMITING THAT IS NOT CONTROLLED WITH YOUR NAUSEA MEDICATION  *UNUSUAL SHORTNESS OF BREATH  *UNUSUAL BRUISING OR BLEEDING  TENDERNESS IN MOUTH AND THROAT WITH OR WITHOUT PRESENCE OF ULCERS  *URINARY PROBLEMS  *BOWEL PROBLEMS  UNUSUAL RASH Items with * indicate a potential emergency and should be followed up as soon as possible.  Feel free to call the clinic you have any questions or concerns. The clinic phone number is [phone].  Please show the [place] at check-in to the Emergency Department and triage nurse.

## 2017-04-10 NOTE — Telephone Encounter (Signed)
Gave avs and calendar for October - november

## 2017-04-10 NOTE — Progress Notes (Signed)
  [facility] OFFICE PROGRESS NOTE   Diagnosis:  Esophagus cancer  INTERVAL HISTORY:   Justin [name] returns as scheduled. He completed another cycle of 5-FU 03/20/2017. He denies nausea/vomiting. No mouth sores. No diarrhea. Stable neuropathy symptoms. No dysphagia. He has occasional pain across the right chest. No bleeding. He continues Xarelto.  Objective:  Vital signs in last 24 hours:  Blood pressure (!) 145/75, pulse 87, temperature 98 F (36.7 C), temperature source Oral, resp. rate 18, height '5\' 10"'$  (1.778 m), weight 195 lb 6.4 oz (88.6 kg), SpO2 95 %.    HEENT: No thrush or ulcers. Resp: Distant breath sounds. Cardio: Regular rate and rhythm. GI: No hepatomegaly. Vascular: No leg edema.  Port-A-Cath without erythema.  Lab Results:  Lab Results  Component Value Date   WBC 12.6 (H) 04/10/2017   HGB 15.2 04/10/2017   HCT 45.0 04/10/2017   MCV 93.1 04/10/2017   PLT 204 04/10/2017   NEUTROABS 8.9 (H) 04/10/2017    Imaging:  No results found.  Medications: I have reviewed the patient's current medications.  Assessment/Plan: 1. 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 Staging CTs 08/18/2016 with nonspecific pulmonary nodules, liver metastases, borderline thoracic and gastrohepatic ligament lymphadenopathy ? Cycle 1 FOLFOX 09/04/2016 ? Cycle 2 FOLFOX 09/18/2016 ? Cycle 3 FOLFOX 10/02/2016 ? Cycle 4 FOLFOX 10/16/2016 ? Cycle 5 FOLFOX 10/30/2016 ? Restaging CTs 11/10/2016-decreased wall thickness distal esophagus; stable borderline mediastinal lymphadenopathy; multiple liver metastases have completely resolved; stable scattered nonspecific tiny bilateral lung nodules ? Cycle 6 FOLFOX 11/13/2016 ? Cycle 7 FOLFOX 11/27/2016 ? Cycle 8 FOLFOX 12/11/2016 ? Cycle 9 FOLFOX 12/25/2016 ? Cycle 10 FOLFOX (oxaliplatin deleted) 01/08/2017 ? Initiation of maintenance therapy with 5-FU/leucovorin  02/06/2017 ? CTs 03/16/2017-stable pulmonary nodules, no esophagus mass, 1 subtle liver lesion ? Maintenance therapy continued with 5-FU/leucovorin every 3 weeks beginning 03/20/2017 2. Diabetes  3. COPD  4. Hypertension  5. History of colon polyps  6. Port-A-Cath placement 08/30/2016  7. 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  8.  Right upper extremity deep vein thrombosis, Port-A-Cath related 03/16/2017 started Xarelto 03/16/2017, possible subsegmental right lower lobe pulmonary embolus on CT 03/16/2017    Disposition: Justin [name] appears stable. There is no clinical evidence of disease progression. He continues to tolerate treatment well. The plan is to continue maintenance 5-fluorouracil every 3 weeks. He will return for a follow-up visit in 3 weeks. He will contact the office in the interim with any problems.    Ned Card ANP/GNP-BC   04/10/2017  9:25 AM

## 2017-04-12 ENCOUNTER — Ambulatory Visit (HOSPITAL_BASED_OUTPATIENT_CLINIC_OR_DEPARTMENT_OTHER): Payer: Medicare Other

## 2017-04-12 VITALS — BP 130/71 | HR 74 | Temp 98.0°F | Resp 18

## 2017-04-12 DIAGNOSIS — C155 Malignant neoplasm of lower third of esophagus: Secondary | ICD-10-CM | POA: Diagnosis not present

## 2017-04-12 MED ORDER — HEPARIN SOD (PORK) LOCK FLUSH 100 UNIT/ML IV SOLN
500.0000 [IU] | Freq: Once | INTRAVENOUS | Status: AC | PRN
  Administered 2017-04-12: 500 [IU]
  Filled 2017-04-12: qty 5

## 2017-04-12 MED ORDER — SODIUM CHLORIDE 0.9% FLUSH
10.0000 mL | INTRAVENOUS | Status: DC | PRN
  Administered 2017-04-12: 10 mL
  Filled 2017-04-12: qty 10

## 2017-04-24 ENCOUNTER — Other Ambulatory Visit: Payer: Self-pay | Admitting: Oncology

## 2017-05-01 ENCOUNTER — Ambulatory Visit (HOSPITAL_BASED_OUTPATIENT_CLINIC_OR_DEPARTMENT_OTHER): Payer: Medicare Other

## 2017-05-01 ENCOUNTER — Ambulatory Visit (HOSPITAL_BASED_OUTPATIENT_CLINIC_OR_DEPARTMENT_OTHER): Payer: Medicare Other | Admitting: Nurse Practitioner

## 2017-05-01 ENCOUNTER — Other Ambulatory Visit (HOSPITAL_BASED_OUTPATIENT_CLINIC_OR_DEPARTMENT_OTHER): Payer: Medicare Other

## 2017-05-01 ENCOUNTER — Ambulatory Visit: Payer: Medicare Other

## 2017-05-01 ENCOUNTER — Telehealth: Payer: Self-pay | Admitting: Emergency Medicine

## 2017-05-01 VITALS — BP 130/77 | HR 82 | Temp 97.8°F | Resp 19 | Ht 70.0 in | Wt 197.8 lb

## 2017-05-01 DIAGNOSIS — C155 Malignant neoplasm of lower third of esophagus: Secondary | ICD-10-CM

## 2017-05-01 DIAGNOSIS — Z95828 Presence of other vascular implants and grafts: Secondary | ICD-10-CM

## 2017-05-01 DIAGNOSIS — Z5111 Encounter for antineoplastic chemotherapy: Secondary | ICD-10-CM

## 2017-05-01 DIAGNOSIS — C787 Secondary malignant neoplasm of liver and intrahepatic bile duct: Secondary | ICD-10-CM

## 2017-05-01 DIAGNOSIS — J449 Chronic obstructive pulmonary disease, unspecified: Secondary | ICD-10-CM

## 2017-05-01 LAB — COMPREHENSIVE METABOLIC PANEL
ALBUMIN: 3.8 g/dL (ref 3.5–5.0)
ALK PHOS: 111 U/L (ref 40–150)
ALT: 14 U/L (ref 0–55)
AST: 15 U/L (ref 5–34)
Anion Gap: 8 mEq/L (ref 3–11)
BILIRUBIN TOTAL: 0.57 mg/dL (ref 0.20–1.20)
BUN: 8.1 mg/dL (ref 7.0–26.0)
CALCIUM: 9.5 mg/dL (ref 8.4–10.4)
CO2: 23 mEq/L (ref 22–29)
CREATININE: 0.8 mg/dL (ref 0.7–1.3)
Chloride: 107 mEq/L (ref 98–109)
EGFR: >90 mL/min/{1.73_m2} (ref >90)
Glucose: 139 mg/dl (ref 70–140)
Potassium: 3.6 mEq/L (ref 3.5–5.1)
Sodium: 137 mEq/L (ref 136–145)
Total Protein: 7.7 g/dL (ref 6.4–8.3)

## 2017-05-01 LAB — CBC WITH DIFFERENTIAL/PLATELET
BASO%: 0.3 % (ref 0.0–2.0)
Basophils Absolute: 0 10*3/uL (ref 0.0–0.1)
EOS%: 1.5 % (ref 0.0–7.0)
Eosinophils Absolute: 0.2 10*3/uL (ref 0.0–0.5)
HCT: 42.7 % (ref 38.4–49.9)
HGB: 14.4 g/dL (ref 13.0–17.1)
LYMPH%: 17.4 % (ref 14.0–49.0)
MCH: 31.7 pg (ref 27.2–33.4)
MCHC: 33.7 g/dL (ref 32.0–36.0)
MCV: 94.1 fL (ref 79.3–98.0)
MONO#: 1.2 10*3/uL — ABNORMAL HIGH (ref 0.1–0.9)
MONO%: 9.6 % (ref 0.0–14.0)
NEUT%: 71.2 % (ref 39.0–75.0)
NEUTROS ABS: 8.8 10*3/uL — AB (ref 1.5–6.5)
Platelets: 206 10*3/uL (ref 140–400)
RBC: 4.54 10*6/uL (ref 4.20–5.82)
RDW: 15.1 % — ABNORMAL HIGH (ref 11.0–14.6)
WBC: 12.3 10*3/uL — AB (ref 4.0–10.3)
lymph#: 2.1 10*3/uL (ref 0.9–3.3)

## 2017-05-01 MED ORDER — SODIUM CHLORIDE 0.9 % IV SOLN
2400.0000 mg/m2 | INTRAVENOUS | Status: DC
  Administered 2017-05-01: 5000 mg via INTRAVENOUS
  Filled 2017-05-01: qty 100

## 2017-05-01 MED ORDER — SODIUM CHLORIDE 0.9% FLUSH
10.0000 mL | INTRAVENOUS | Status: DC | PRN
  Administered 2017-05-01: 10 mL via INTRAVENOUS
  Filled 2017-05-01: qty 10

## 2017-05-01 MED ORDER — SODIUM CHLORIDE 0.9 % IV SOLN
400.0000 mg/m2 | Freq: Once | INTRAVENOUS | Status: AC
  Administered 2017-05-01: 832 mg via INTRAVENOUS
  Filled 2017-05-01: qty 41.6

## 2017-05-01 MED ORDER — FLUOROURACIL CHEMO INJECTION 2.5 GM/50ML
400.0000 mg/m2 | Freq: Once | INTRAVENOUS | Status: AC
  Administered 2017-05-01: 850 mg via INTRAVENOUS
  Filled 2017-05-01: qty 17

## 2017-05-01 MED ORDER — DEXTROSE 5 % IV SOLN
Freq: Once | INTRAVENOUS | Status: AC
  Administered 2017-05-01: 10:00:00 via INTRAVENOUS

## 2017-05-01 NOTE — Telephone Encounter (Signed)
Message sent to scheduling to add Flu shot to pump DC appt on 10/11 per Ned Card, NP

## 2017-05-01 NOTE — Progress Notes (Addendum)
  [facility] OFFICE PROGRESS NOTE   Diagnosis:  Esophagus cancer  INTERVAL HISTORY:   Justin [name] returns as scheduled. He completed another cycle of 5-FU 04/10/2017. He denies nausea/vomiting. No mouth sores. No diarrhea. Neuropathy symptoms are unchanged. No hand or foot pain or redness. He has a good appetite. He is gaining weight. No dysphagia. He continues Xarelto. No bleeding.  While in the examination room today Justin [name] noticed a "bug bite" on the right small finger.  Objective:  Vital signs in last 24 hours:  Blood pressure 130/77, pulse 82, temperature 97.8 F (36.6 C), temperature source Oral, resp. rate 19, height '5\' 10"'$  (1.778 m), weight 197 lb 12.8 oz (89.7 kg), SpO2 97 %.    HEENT: No thrush or ulcers. Resp: Bilateral inspiratory rhonchi. No respiratory distress. Cardio: Regular rate and rhythm. GI: Abdomen soft and nontender. No hepatomegaly. Vascular: No leg edema. Skin: Palms without erythema. Right small finger with small pustule with surrounding erythema. Port-A-Cath without erythema.    Lab Results:  Lab Results  Component Value Date   WBC 12.3 (H) 05/01/2017   HGB 14.4 05/01/2017   HCT 42.7 05/01/2017   MCV 94.1 05/01/2017   PLT 206 05/01/2017   NEUTROABS 8.8 (H) 05/01/2017    Imaging:  No results found.  Medications: I have reviewed the patient's current medications.  Assessment/Plan: 1. 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 Staging CTs 08/18/2016 with nonspecific pulmonary nodules, liver metastases, borderline thoracic and gastrohepatic ligament lymphadenopathy ? Cycle 1 FOLFOX 09/04/2016 ? Cycle 2 FOLFOX 09/18/2016 ? Cycle 3 FOLFOX 10/02/2016 ? Cycle 4 FOLFOX 10/16/2016 ? Cycle 5 FOLFOX 10/30/2016 ? Restaging CTs 11/10/2016-decreased wall thickness distal esophagus; stable borderline mediastinal lymphadenopathy; multiple liver metastases have completely  resolved; stable scattered nonspecific tiny bilateral lung nodules ? Cycle 6 FOLFOX 11/13/2016 ? Cycle 7 FOLFOX 11/27/2016 ? Cycle 8 FOLFOX 12/11/2016 ? Cycle 9 FOLFOX 12/25/2016 ? Cycle 10 FOLFOX (oxaliplatin deleted) 01/08/2017 ? Initiation of maintenance therapy with 5-FU/leucovorin 02/06/2017 ? CTs 03/16/2017-stable pulmonary nodules, no esophagus mass, 1 subtle liver lesion ? Maintenance therapy continued with 5-FU/leucovorin every 3 weeks beginning 03/20/2017 2. Diabetes  3. COPD  4. Hypertension  5. History of colon polyps  6. Port-A-Cath placement 08/30/2016  7. 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  8. Right upper extremity deep vein thrombosis, Port-A-Cath related 03/16/2017 started Xarelto 03/16/2017, possible subsegmental right lower lobe pulmonary embolus on CT 03/16/2017     Disposition: Justin [name] appears stable. There is no clinical evidence of disease progression. Plan to continue maintenance therapy with 5-FU/leucovorin every 3 weeks. He will return for a follow-up visit and treatment 05/22/2017. He will contact the office in the interim with any problems.  He has a small pustule on the right small finger. He thinks this may be related to an insect bite. He understands to contact the office with spreading redness.  The plan is for restaging CTs at a four-month interval from the previous scans.  Ned Card ANP/GNP-BC   05/01/2017  9:38 AM

## 2017-05-01 NOTE — Patient Instructions (Signed)
[facility] Discharge Instructions for Patients Receiving Chemotherapy  Today you received the following chemotherapy agents: Leucovorin/5 FU To help prevent nausea and vomiting after your treatment, we encourage you to take your nausea medication as prescribed.   If you develop nausea and vomiting that is not controlled by your nausea medication, call the clinic.   BELOW ARE SYMPTOMS THAT SHOULD BE REPORTED IMMEDIATELY:  *FEVER GREATER THAN 100.5 F  *CHILLS WITH OR WITHOUT FEVER  NAUSEA AND VOMITING THAT IS NOT CONTROLLED WITH YOUR NAUSEA MEDICATION  *UNUSUAL SHORTNESS OF BREATH  *UNUSUAL BRUISING OR BLEEDING  TENDERNESS IN MOUTH AND THROAT WITH OR WITHOUT PRESENCE OF ULCERS  *URINARY PROBLEMS  *BOWEL PROBLEMS  UNUSUAL RASH Items with * indicate a potential emergency and should be followed up as soon as possible.  Feel free to call the clinic you have any questions or concerns. The clinic phone number is [phone].  Please show the [place] at check-in to the Emergency Department and triage nurse.

## 2017-05-01 NOTE — Patient Instructions (Signed)
Implanted Port Home Guide An implanted port is a type of central line that is placed under the skin. Central lines are used to provide IV access when treatment or nutrition needs to be given through a person's veins. Implanted ports are used for long-term IV access. An implanted port may be placed because:  You need IV medicine that would be irritating to the small veins in your hands or arms.  You need long-term IV medicines, such as antibiotics.  You need IV nutrition for a long period.  You need frequent blood draws for lab tests.  You need dialysis.  Implanted ports are usually placed in the chest area, but they can also be placed in the upper arm, the abdomen, or the leg. An implanted port has two main parts:  Reservoir. The reservoir is round and will appear as a small, raised area under your skin. The reservoir is the part where a needle is inserted to give medicines or draw blood.  Catheter. The catheter is a thin, flexible tube that extends from the reservoir. The catheter is placed into a large vein. Medicine that is inserted into the reservoir goes into the catheter and then into the vein.  How will I care for my incision site? Do not get the incision site wet. Bathe or shower as directed by your health care provider. How is my port accessed? Special steps must be taken to access the port:  Before the port is accessed, a numbing cream can be placed on the skin. This helps numb the skin over the port site.  Your health care provider uses a sterile technique to access the port. ? Your health care provider must put on a mask and sterile gloves. ? The skin over your port is cleaned carefully with an antiseptic and allowed to dry. ? The port is gently pinched between sterile gloves, and a needle is inserted into the port.  Only "non-coring" port needles should be used to access the port. Once the port is accessed, a blood return should be checked. This helps ensure that the port  is in the vein and is not clogged.  If your port needs to remain accessed for a constant infusion, a clear (transparent) bandage will be placed over the needle site. The bandage and needle will need to be changed every week, or as directed by your health care provider.  Keep the bandage covering the needle clean and dry. Do not get it wet. Follow your health care provider's instructions on how to take a shower or bath while the port is accessed.  If your port does not need to stay accessed, no bandage is needed over the port.  What is flushing? Flushing helps keep the port from getting clogged. Follow your health care provider's instructions on how and when to flush the port. Ports are usually flushed with saline solution or a medicine called heparin. The need for flushing will depend on how the port is used.  If the port is used for intermittent medicines or blood draws, the port will need to be flushed: ? After medicines have been given. ? After blood has been drawn. ? As part of routine maintenance.  If a constant infusion is running, the port may not need to be flushed.  How long will my port stay implanted? The port can stay in for as long as your health care provider thinks it is needed. When it is time for the port to come out, surgery will be   done to remove it. The procedure is similar to the one performed when the port was put in. When should I seek immediate medical care? When you have an implanted port, you should seek immediate medical care if:  You notice a bad smell coming from the incision site.  You have swelling, redness, or drainage at the incision site.  You have more swelling or pain at the port site or the surrounding area.  You have a fever that is not controlled with medicine.  This information is not intended to replace advice given to you by your health care provider. Make sure you discuss any questions you have with your health care provider. Document  Released: 07/10/2005 Document Revised: 12/16/2015 Document Reviewed: 03/17/2013 Elsevier Interactive Patient Education  2017 [employer]  

## 2017-05-03 ENCOUNTER — Ambulatory Visit (HOSPITAL_BASED_OUTPATIENT_CLINIC_OR_DEPARTMENT_OTHER): Payer: Medicare Other

## 2017-05-03 DIAGNOSIS — Z23 Encounter for immunization: Secondary | ICD-10-CM

## 2017-05-03 DIAGNOSIS — M7989 Other specified soft tissue disorders: Secondary | ICD-10-CM

## 2017-05-03 DIAGNOSIS — C155 Malignant neoplasm of lower third of esophagus: Secondary | ICD-10-CM

## 2017-05-03 MED ORDER — SODIUM CHLORIDE 0.9% FLUSH
10.0000 mL | INTRAVENOUS | Status: DC | PRN
  Administered 2017-05-03: 10 mL
  Filled 2017-05-03: qty 10

## 2017-05-03 MED ORDER — HEPARIN SOD (PORK) LOCK FLUSH 100 UNIT/ML IV SOLN
500.0000 [IU] | Freq: Once | INTRAVENOUS | Status: AC | PRN
  Administered 2017-05-03: 500 [IU]
  Filled 2017-05-03: qty 5

## 2017-05-03 MED ORDER — INFLUENZA VAC SPLIT QUAD 0.5 ML IM SUSY
0.5000 mL | PREFILLED_SYRINGE | Freq: Once | INTRAMUSCULAR | Status: AC
  Administered 2017-05-03: 0.5 mL via INTRAMUSCULAR
  Filled 2017-05-03: qty 0.5

## 2017-05-19 ENCOUNTER — Other Ambulatory Visit: Payer: Self-pay | Admitting: Oncology

## 2017-05-22 ENCOUNTER — Ambulatory Visit (HOSPITAL_BASED_OUTPATIENT_CLINIC_OR_DEPARTMENT_OTHER): Payer: Medicare Other

## 2017-05-22 ENCOUNTER — Telehealth: Payer: Self-pay | Admitting: Oncology

## 2017-05-22 ENCOUNTER — Ambulatory Visit (HOSPITAL_BASED_OUTPATIENT_CLINIC_OR_DEPARTMENT_OTHER): Payer: Medicare Other | Admitting: Nurse Practitioner

## 2017-05-22 ENCOUNTER — Other Ambulatory Visit (HOSPITAL_BASED_OUTPATIENT_CLINIC_OR_DEPARTMENT_OTHER): Payer: Medicare Other

## 2017-05-22 ENCOUNTER — Ambulatory Visit: Payer: Medicare Other

## 2017-05-22 VITALS — BP 146/80 | HR 84 | Temp 98.0°F | Resp 18 | Ht 70.0 in | Wt 201.1 lb

## 2017-05-22 DIAGNOSIS — C155 Malignant neoplasm of lower third of esophagus: Secondary | ICD-10-CM | POA: Diagnosis not present

## 2017-05-22 DIAGNOSIS — C787 Secondary malignant neoplasm of liver and intrahepatic bile duct: Secondary | ICD-10-CM | POA: Diagnosis not present

## 2017-05-22 DIAGNOSIS — Z5111 Encounter for antineoplastic chemotherapy: Secondary | ICD-10-CM

## 2017-05-22 DIAGNOSIS — J449 Chronic obstructive pulmonary disease, unspecified: Secondary | ICD-10-CM | POA: Diagnosis not present

## 2017-05-22 DIAGNOSIS — Z95828 Presence of other vascular implants and grafts: Secondary | ICD-10-CM

## 2017-05-22 LAB — CBC WITH DIFFERENTIAL/PLATELET
BASO%: 0.3 % (ref 0.0–2.0)
Basophils Absolute: 0 10*3/uL (ref 0.0–0.1)
EOS%: 1.7 % (ref 0.0–7.0)
Eosinophils Absolute: 0.2 10*3/uL (ref 0.0–0.5)
HCT: 43.7 % (ref 38.4–49.9)
HGB: 14.6 g/dL (ref 13.0–17.1)
LYMPH%: 17.2 % (ref 14.0–49.0)
MCH: 31.4 pg (ref 27.2–33.4)
MCHC: 33.4 g/dL (ref 32.0–36.0)
MCV: 94 fL (ref 79.3–98.0)
MONO#: 1.2 10*3/uL — AB (ref 0.1–0.9)
MONO%: 9.7 % (ref 0.0–14.0)
NEUT%: 71.1 % (ref 39.0–75.0)
NEUTROS ABS: 9 10*3/uL — AB (ref 1.5–6.5)
PLATELETS: 218 10*3/uL (ref 140–400)
RBC: 4.65 10*6/uL (ref 4.20–5.82)
RDW: 14.8 % — ABNORMAL HIGH (ref 11.0–14.6)
WBC: 12.7 10*3/uL — AB (ref 4.0–10.3)
lymph#: 2.2 10*3/uL (ref 0.9–3.3)

## 2017-05-22 LAB — COMPREHENSIVE METABOLIC PANEL
ALT: 14 U/L (ref 0–55)
AST: 18 U/L (ref 5–34)
Albumin: 3.6 g/dL (ref 3.5–5.0)
Alkaline Phosphatase: 110 U/L (ref 40–150)
Anion Gap: 9 mEq/L (ref 3–11)
BILIRUBIN TOTAL: 0.27 mg/dL (ref 0.20–1.20)
BUN: 11.1 mg/dL (ref 7.0–26.0)
CALCIUM: 9.6 mg/dL (ref 8.4–10.4)
CHLORIDE: 107 meq/L (ref 98–109)
CO2: 22 meq/L (ref 22–29)
Creatinine: 0.8 mg/dL (ref 0.7–1.3)
EGFR: >60 mL/min/{1.73_m2} (ref >60)
Glucose: 144 mg/dl — ABNORMAL HIGH (ref 70–140)
POTASSIUM: 3.6 meq/L (ref 3.5–5.1)
SODIUM: 138 meq/L (ref 136–145)
Total Protein: 7.6 g/dL (ref 6.4–8.3)

## 2017-05-22 MED ORDER — SODIUM CHLORIDE 0.9 % IV SOLN
400.0000 mg/m2 | Freq: Once | INTRAVENOUS | Status: AC
  Administered 2017-05-22: 832 mg via INTRAVENOUS
  Filled 2017-05-22: qty 41.6

## 2017-05-22 MED ORDER — SODIUM CHLORIDE 0.9 % IV SOLN
2400.0000 mg/m2 | INTRAVENOUS | Status: DC
  Administered 2017-05-22: 5000 mg via INTRAVENOUS
  Filled 2017-05-22: qty 100

## 2017-05-22 MED ORDER — SODIUM CHLORIDE 0.9% FLUSH
10.0000 mL | INTRAVENOUS | Status: DC | PRN
  Administered 2017-05-22: 10 mL via INTRAVENOUS
  Filled 2017-05-22: qty 10

## 2017-05-22 MED ORDER — FLUOROURACIL CHEMO INJECTION 2.5 GM/50ML
400.0000 mg/m2 | Freq: Once | INTRAVENOUS | Status: AC
  Administered 2017-05-22: 850 mg via INTRAVENOUS
  Filled 2017-05-22: qty 17

## 2017-05-22 MED ORDER — SODIUM CHLORIDE 0.9 % IV SOLN
Freq: Once | INTRAVENOUS | Status: AC
  Administered 2017-05-22: 10:00:00 via INTRAVENOUS

## 2017-05-22 NOTE — Progress Notes (Signed)
  [facility] OFFICE PROGRESS NOTE   Diagnosis:  Esophagus cancer  INTERVAL HISTORY:   Justin [name] returns as scheduled. He completed another cycle of 5-FU 05/01/2017. He denies nausea/vomiting. No mouth sores. No diarrhea. No hand or foot pain or redness. A week or so ago he burned his throat on hot food. Immediately following that he noted a small amount of blood mixed with expectorated phlegm. This resolved after 1 or 2 occurrences. Over the past month he has had a few episodes of right-sided abdominal pain. There are no relieving or exacerbating factors. The pain resolves spontaneously after a short period of time. There are no associated symptoms.  Objective:  Vital signs in last 24 hours:  Blood pressure (!) 146/80, pulse 84, temperature 98 F (36.7 C), temperature source Oral, resp. rate 18, height _0  (1.778 m), weight 201 lb 1.6 oz (91.2 kg), SpO2 95 %.    HEENT: no thrush or ulcers. Resp: bilateral rhonchi. No respiratory distress. Cardio: regular rate and rhythm. GI: abdomen soft and nontender. No hepatomegaly. Vascular: no leg edema. Skin: Palms without erythema. Port-A-Cath without erythema.    Lab Results:  Lab Results  Component Value Date   WBC 12.7 (H) 05/22/2017   HGB 14.6 05/22/2017   HCT 43.7 05/22/2017   MCV 94.0 05/22/2017   PLT 218 05/22/2017   NEUTROABS 9.0 (H) 05/22/2017    Imaging:  No results found.  Medications: I have reviewed the patient's current medications.  Assessment/Plan: 1. 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 Staging CTs 08/18/2016 with nonspecific pulmonary nodules, liver metastases, borderline thoracic and gastrohepatic ligament lymphadenopathy ? Cycle 1 FOLFOX 09/04/2016 ? Cycle 2 FOLFOX 09/18/2016 ? Cycle 3 FOLFOX 10/02/2016 ? Cycle 4 FOLFOX 10/16/2016 ? Cycle 5 FOLFOX 10/30/2016 ? Restaging CTs 11/10/2016-decreased wall thickness distal  esophagus; stable borderline mediastinal lymphadenopathy; multiple liver metastases have completely resolved; stable scattered nonspecific tiny bilateral lung nodules ? Cycle 6 FOLFOX 11/13/2016 ? Cycle 7 FOLFOX 11/27/2016 ? Cycle 8 FOLFOX 12/11/2016 ? Cycle 9 FOLFOX 12/25/2016 ? Cycle 10 FOLFOX (oxaliplatin deleted) 01/08/2017 ? Initiation of maintenance therapy with 5-FU/leucovorin 02/06/2017 ? CTs 03/16/2017-stable pulmonary nodules, no esophagus mass, 1 subtle liver lesion ? Maintenance therapy continued with 5-FU/leucovorin every 3 weeks beginning 03/20/2017 2. Diabetes  3. COPD  4. Hypertension  5. History of colon polyps  6. Port-A-Cath placement 08/30/2016  7. 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  8. Right upper extremity deep vein thrombosis, Port-A-Cath related 08/24/2018started Xarelto 03/16/2017, possible subsegmental right lower lobe pulmonary embolus on CT 03/16/2017    Disposition: Justin [name] appears unchanged. There is no clinical evidence of disease progression. Plan to continue maintenance5-FU/leucovorin every 3 weeks. Next restaging CT evaluation planned  in December. He will return for a follow-up visit and treatment 06/11/2017. He will contact the office in the interim with any problems. We specifically discussed worsening abdominal pain and hemoptysis.    Ned Card ANP/GNP-BC   05/22/2017  8:53 AM

## 2017-05-22 NOTE — Telephone Encounter (Signed)
Gave avs and calendar for December and January 2019 °

## 2017-05-22 NOTE — Patient Instructions (Signed)
[facility] Discharge Instructions for Patients Receiving Chemotherapy  Today you received the following chemotherapy agents: Leucovorin and Adrucil   To help prevent nausea and vomiting after your treatment, we encourage you to take your nausea medication as directed.    If you develop nausea and vomiting that is not controlled by your nausea medication, call the clinic.   BELOW ARE SYMPTOMS THAT SHOULD BE REPORTED IMMEDIATELY:  *FEVER GREATER THAN 100.5 F  *CHILLS WITH OR WITHOUT FEVER  NAUSEA AND VOMITING THAT IS NOT CONTROLLED WITH YOUR NAUSEA MEDICATION  *UNUSUAL SHORTNESS OF BREATH  *UNUSUAL BRUISING OR BLEEDING  TENDERNESS IN MOUTH AND THROAT WITH OR WITHOUT PRESENCE OF ULCERS  *URINARY PROBLEMS  *BOWEL PROBLEMS  UNUSUAL RASH Items with * indicate a potential emergency and should be followed up as soon as possible.  Feel free to call the clinic should you have any questions or concerns. The clinic phone number is [phone].  Please show the CHEMO ALERT CARD at check-in to the Emergency Department and triage nurse.   

## 2017-05-22 NOTE — Patient Instructions (Signed)

## 2017-05-24 ENCOUNTER — Emergency Department (HOSPITAL_COMMUNITY)
Admission: EM | Admit: 2017-05-24 | Discharge: 2017-05-25 | Disposition: A | Discharge status: Home / Self Care | Payer: Medicare Other | Attending: Emergency Medicine | Admitting: Emergency Medicine

## 2017-05-24 ENCOUNTER — Encounter (HOSPITAL_COMMUNITY): Payer: Self-pay

## 2017-05-24 ENCOUNTER — Ambulatory Visit (HOSPITAL_BASED_OUTPATIENT_CLINIC_OR_DEPARTMENT_OTHER): Payer: Medicare Other

## 2017-05-24 VITALS — BP 139/74 | HR 90 | Temp 97.8°F | Resp 18

## 2017-05-24 DIAGNOSIS — C155 Malignant neoplasm of lower third of esophagus: Secondary | ICD-10-CM | POA: Diagnosis not present

## 2017-05-24 DIAGNOSIS — F17210 Nicotine dependence, cigarettes, uncomplicated: Secondary | ICD-10-CM | POA: Insufficient documentation

## 2017-05-24 DIAGNOSIS — E119 Type 2 diabetes mellitus without complications: Secondary | ICD-10-CM | POA: Insufficient documentation

## 2017-05-24 DIAGNOSIS — E7800 Pure hypercholesterolemia, unspecified: Secondary | ICD-10-CM | POA: Diagnosis not present

## 2017-05-24 DIAGNOSIS — Z86718 Personal history of other venous thrombosis and embolism: Secondary | ICD-10-CM | POA: Insufficient documentation

## 2017-05-24 DIAGNOSIS — Z79899 Other long term (current) drug therapy: Secondary | ICD-10-CM | POA: Insufficient documentation

## 2017-05-24 DIAGNOSIS — R310 Gross hematuria: Secondary | ICD-10-CM

## 2017-05-24 DIAGNOSIS — R319 Hematuria, unspecified: Secondary | ICD-10-CM | POA: Diagnosis present

## 2017-05-24 DIAGNOSIS — I10 Essential (primary) hypertension: Secondary | ICD-10-CM | POA: Diagnosis not present

## 2017-05-24 LAB — BASIC METABOLIC PANEL
Anion gap: 10 (ref 5–15)
BUN: 15 mg/dL (ref 6–20)
CALCIUM: 9.5 mg/dL (ref 8.9–10.3)
CO2: 24 mmol/L (ref 22–32)
CREATININE: 0.88 mg/dL (ref 0.61–1.24)
Chloride: 102 mmol/L (ref 101–111)
GLUCOSE: 142 mg/dL — AB (ref 65–99)
Potassium: 3.6 mmol/L (ref 3.5–5.1)
Sodium: 136 mmol/L (ref 135–145)

## 2017-05-24 LAB — CBC WITH DIFFERENTIAL/PLATELET
BASOS ABS: 0 10*3/uL (ref 0.0–0.1)
BASOS PCT: 0 %
EOS PCT: 2 %
Eosinophils Absolute: 0.3 10*3/uL (ref 0.0–0.7)
HCT: 41.8 % (ref 39.0–52.0)
Hemoglobin: 14.4 g/dL (ref 13.0–17.0)
Lymphocytes Relative: 20 %
Lymphs Abs: 2.7 10*3/uL (ref 0.7–4.0)
MCH: 31.7 pg (ref 26.0–34.0)
MCHC: 34.4 g/dL (ref 30.0–36.0)
MCV: 92.1 fL (ref 78.0–100.0)
MONO ABS: 0.5 10*3/uL (ref 0.1–1.0)
MONOS PCT: 4 %
Neutro Abs: 10.1 10*3/uL — ABNORMAL HIGH (ref 1.7–7.7)
Neutrophils Relative %: 74 %
PLATELETS: 248 10*3/uL (ref 150–400)
RBC: 4.54 MIL/uL (ref 4.22–5.81)
RDW: 14.5 % (ref 11.5–15.5)
WBC: 13.6 10*3/uL — ABNORMAL HIGH (ref 4.0–10.5)

## 2017-05-24 MED ORDER — SODIUM CHLORIDE 0.9% FLUSH
10.0000 mL | INTRAVENOUS | Status: DC | PRN
  Administered 2017-05-24: 10 mL
  Filled 2017-05-24: qty 10

## 2017-05-24 MED ORDER — HEPARIN SOD (PORK) LOCK FLUSH 100 UNIT/ML IV SOLN
500.0000 [IU] | Freq: Once | INTRAVENOUS | Status: AC | PRN
  Administered 2017-05-24: 500 [IU]
  Filled 2017-05-24: qty 5

## 2017-05-24 NOTE — ED Provider Notes (Signed)
[place] DEPT Provider Note   CSN: [id number] Arrival date & time: 05/24/17  2052     History   Chief Complaint Chief Complaint  Patient presents with  . Hematuria    Cancer Patient    HPI PRADYUN ISHMAN is a 66 y.o. male.  HPI Patient presents to the emergency room for evaluation of hematuria.  Patient states he had an episode of bright red blood in his urine when he went to urinate this afternoon.  He has had a few more episodes this evening.  Patient states he is only passing very dark blood.  He has not noticed any clots.  It does not seem to be clearing at all.  If he does have a history of hematuria in the past but that was many years ago.  He saw Dr. Gaynelle Arabian at that time.  Patient denies any trouble with lightheadedness.  No fevers or chills.  He is getting chemotherapy.  He finished a treatment today.  Patient is also taking Xarelto because of a DVT in his upper extremity. Past Medical History:  Diagnosis Date  . Allergy   . Cancer ([place])   . Cataract   . Diabetes mellitus   . Headache   . History of kidney stones   . Hx of colonic polyps    pre-cancerous  . Hypercholesterolemia   . Hypertension     Patient Active Problem List   Diagnosis Date Noted  . Deep vein thrombosis (DVT) ([place]) 03/16/2017  . Neuropathy due to chemotherapeutic drug ([place]) 02/26/2017  . Port catheter in place 09/18/2016  . Cancer of distal third of esophagus ([name]) 08/25/2016  . Goals of care, counseling/discussion 08/25/2016  . 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 History of renal stone 01/28/2016  . RLS (restless legs syndrome) 09/30/2015  . Arthritis 12/20/2012  . Current smoker 12/20/2012  . Type 2 diabetes with complication ([place]) 91/47/8295  . Hypertension associated with diabetes ([place]) 12/02/2010  . Hyperlipidemia associated with type 2 diabetes mellitus ([place]) 12/02/2010  . Allergic rhinitis  12/02/2010  . Diverticulosis of colon 12/02/2010  . BPH (benign prostatic hyperplasia) 12/02/2010  . Hx of colonic polyps 12/02/2010    Past Surgical History:  Procedure Laterality Date  . COLONOSCOPY  [id number]  . ESOPHAGOGASTRODUODENOSCOPY (EGD) WITH PROPOFOL N/A 08/18/2016   Procedure: ESOPHAGOGASTRODUODENOSCOPY (EGD) WITH PROPOFOL;  Surgeon: Carol Ada, MD;  Location: [facility];  Service: Endoscopy;  Laterality: N/A;  . IR GENERIC HISTORICAL  08/30/2016   IR US GUIDE VASC ACCESS RIGHT 08/30/2016 Corrie Mckusick, DO [facility]  . IR GENERIC HISTORICAL  08/30/2016   IR FLUORO GUIDE PORT INSERTION RIGHT 08/30/2016 Corrie Mckusick, DO [facility]       Home Medications    Prior to Admission medications   Medication Sig Start Date End Date Taking? Authorizing Provider  finasteride (PROSCAR) 5 MG tablet Take 1 tablet (5 mg total) by mouth daily. 01/28/16  Yes Denita Lung, MD  JANUVIA 100 MG tablet TAKE 1 TABLET DAILY Patient taking differently: take 100mg  by mouth daily 08/07/16  Yes Denita Lung, MD  lidocaine-prilocaine (EMLA) cream Apply 1 application topically as needed. Apply to port site and cover with plastic wrap one hour before port access 03/06/17  Yes Owens Shark, NP  lisinopril-hydrochlorothiazide (PRINZIDE,ZESTORETIC) 10-12.5 MG tablet Take 1 tablet by mouth daily. 01/28/16  Yes Denita Lung, MD  rivaroxaban (XARELTO) 20 MG TABS tablet Take 1 tablet (  20 mg total) by mouth daily with supper. 04/10/17  Yes Owens Shark, NP  glucose blood test strip PT IS TO TEST ONE TO TWO TIMES A DAY 01/28/16   Denita Lung, MD  Lancets (FREESTYLE) lancets PT IS TO TEST ONE TO TWO TIMES A DAY 01/28/16   Denita Lung, MD  prochlorperazine (COMPAZINE) 10 MG tablet Take 1 tablet (10 mg total) by mouth every 6 (six) hours as needed for nausea or vomiting. Patient not taking: Reported on 05/22/2017 08/29/16   Ladell Pier, MD  simvastatin (ZOCOR) 10 MG tablet Take 1 tablet (10 mg total)  by mouth at bedtime. Patient not taking: Reported on 05/22/2017 01/28/16   Denita Lung, MD    Family History Family History  Problem Relation Age of Onset  . Stomach cancer Father   . Breast cancer Sister   . Liver cancer Brother     Social History Social History  Substance Use Topics  . Smoking status: Current Every Day Smoker    Packs/day: 1.00    Years: 50.00    Types: Cigarettes  . Smokeless tobacco: Never Used  . Alcohol use No     Allergies   Simvastatin   Review of Systems Review of Systems  All other systems reviewed and are negative.    Physical Exam Updated Vital Signs BP 126/75   Pulse 85   Temp 98.2 F (36.8 C) (Oral)   Resp 20   Ht 1.791 m (5' 10.5")   Wt 96.2 kg (212 lb)   SpO2 93%   BMI 29.99 kg/m   Physical Exam  Constitutional: He appears well-developed and well-nourished. No distress.  HENT:  Head: Normocephalic and atraumatic.  Right Ear: External ear normal.  Left Ear: External ear normal.  Eyes: Conjunctivae are normal. Right eye exhibits no discharge. Left eye exhibits no discharge. No scleral icterus.  Neck: Neck supple. No tracheal deviation present.  Cardiovascular: Normal rate, regular rhythm and intact distal pulses.   Pulmonary/Chest: Effort normal and breath sounds normal. No stridor. No respiratory distress. He has no wheezes. He has no rales.  Abdominal: Soft. Bowel sounds are normal. He exhibits no distension. There is no tenderness. There is no rebound and no guarding.  Musculoskeletal: He exhibits no edema or tenderness.  Neurological: He is alert. He has normal strength. No cranial nerve deficit (no facial droop, extraocular movements intact, no slurred speech) or sensory deficit. He exhibits normal muscle tone. He displays no seizure activity. Coordination normal.  Skin: Skin is warm and dry. No rash noted.  Psychiatric: He has a normal mood and affect.  Nursing note and vitals reviewed.    ED Treatments / Results   Labs (all labs ordered are listed, but only abnormal results are displayed) Labs Reviewed  CBC WITH DIFFERENTIAL/PLATELET - Abnormal; Notable for the following:       Result Value   WBC 13.6 (*)    Neutro Abs 10.1 (*)    All other components within normal limits  BASIC METABOLIC PANEL - Abnormal; Notable for the following:    Glucose, Bld 142 (*)    All other components within normal limits  URINALYSIS, ROUTINE W REFLEX MICROSCOPIC   Procedures Procedures (including critical care time)  Medications Ordered in ED Medications - No data to display   Initial Impression / Assessment and Plan / ED Course  I have reviewed the triage vital signs and the nursing notes.  Pertinent labs & imaging results that were  available during my care of the patient were reviewed by me and considered in my medical decision making (see chart for details).  Clinical Course as of May 25 2351  Thu May 24, 2017  2348 Foley catheter inserted.  Pt is tolerating continuous bladder irrigation.    [JK]    Clinical Course User Index [JK] Dorie Rank, MD    Pt presents with gross hematuria.  Labs are stable.  Catheter inserted for whole bladder irrigation.  Pt is tolerating,  Hematuria seems to be clearing.  Will turn over to oncoming MD.  Anticipate discharge after bladder irrigation assuming it continues to clear.  Close outpatient urology follow up.  Final Clinical Impressions(s) / ED Diagnoses   Final diagnoses:  None    New Prescriptions New Prescriptions   No medications on file     Dorie Rank, MD 05/25/17 1504

## 2017-05-24 NOTE — ED Triage Notes (Signed)
Pt reporting hematuria starting this evening. He denies pain and states that this has happened before. He denies passing clots, but states that his urine was "straight blood." A&Ox4. Hx esophageal cancer. Last chemo finished today.

## 2017-05-25 ENCOUNTER — Telehealth: Payer: Self-pay

## 2017-05-25 LAB — URINALYSIS, ROUTINE W REFLEX MICROSCOPIC

## 2017-05-25 LAB — URINALYSIS, MICROSCOPIC (REFLEX)

## 2017-05-25 MED ORDER — HEPARIN SOD (PORK) LOCK FLUSH 100 UNIT/ML IV SOLN
500.0000 [IU] | Freq: Once | INTRAVENOUS | Status: AC
  Administered 2017-05-25: 500 [IU]
  Filled 2017-05-25: qty 5

## 2017-05-25 NOTE — Telephone Encounter (Signed)
Incoming telehealth report of hematuria. Pt went to ED and they did continuous bladder irrigation to clear. He was instructed to f/u with urologist.   S/w wife this AM. He had just a small amount of blood in urine this morning. Instructed her about careful observation. If worsens or passes clots to call. To continue with urology f/u. She will call Dr Arlyn Leak office this morning.

## 2017-05-25 NOTE — ED Provider Notes (Signed)
Patient initially seen and evaluated by Dr. Tomi Bamberger, signed out to me to evaluate response to continuous bladder irrigation.  Urine is now almost completely clear.  Laboratory workup shows stable hemoglobin, adequate platelet count.  Foley catheter is removed and he is discharged with instructions to follow-up with urology.   Delora Fuel, MD 77/41/42 [phone]

## 2017-05-25 NOTE — Telephone Encounter (Signed)
S/w Ned Card NP and she agrees with plan.

## 2017-06-10 ENCOUNTER — Other Ambulatory Visit: Payer: Self-pay | Admitting: Oncology

## 2017-06-11 ENCOUNTER — Ambulatory Visit: Payer: Medicare Other

## 2017-06-11 ENCOUNTER — Other Ambulatory Visit (HOSPITAL_BASED_OUTPATIENT_CLINIC_OR_DEPARTMENT_OTHER): Payer: Medicare Other

## 2017-06-11 ENCOUNTER — Ambulatory Visit (HOSPITAL_BASED_OUTPATIENT_CLINIC_OR_DEPARTMENT_OTHER): Payer: Medicare Other

## 2017-06-11 ENCOUNTER — Ambulatory Visit (HOSPITAL_BASED_OUTPATIENT_CLINIC_OR_DEPARTMENT_OTHER): Payer: Medicare Other | Admitting: Oncology

## 2017-06-11 VITALS — BP 146/81 | HR 85 | Temp 97.7°F | Resp 18 | Ht 70.5 in | Wt 199.4 lb

## 2017-06-11 DIAGNOSIS — Z95828 Presence of other vascular implants and grafts: Secondary | ICD-10-CM

## 2017-06-11 DIAGNOSIS — C787 Secondary malignant neoplasm of liver and intrahepatic bile duct: Secondary | ICD-10-CM

## 2017-06-11 DIAGNOSIS — E119 Type 2 diabetes mellitus without complications: Secondary | ICD-10-CM

## 2017-06-11 DIAGNOSIS — C155 Malignant neoplasm of lower third of esophagus: Secondary | ICD-10-CM | POA: Diagnosis not present

## 2017-06-11 DIAGNOSIS — Z5111 Encounter for antineoplastic chemotherapy: Secondary | ICD-10-CM

## 2017-06-11 DIAGNOSIS — J449 Chronic obstructive pulmonary disease, unspecified: Secondary | ICD-10-CM | POA: Diagnosis not present

## 2017-06-11 LAB — CBC WITH DIFFERENTIAL/PLATELET
BASO%: 0.9 % (ref 0.0–2.0)
BASOS ABS: 0.1 10*3/uL (ref 0.0–0.1)
EOS%: 1.7 % (ref 0.0–7.0)
Eosinophils Absolute: 0.2 10*3/uL (ref 0.0–0.5)
HEMATOCRIT: 42 % (ref 38.4–49.9)
HEMOGLOBIN: 14 g/dL (ref 13.0–17.1)
LYMPH#: 1.9 10*3/uL (ref 0.9–3.3)
LYMPH%: 16.3 % (ref 14.0–49.0)
MCH: 30.8 pg (ref 27.2–33.4)
MCHC: 33.4 g/dL (ref 32.0–36.0)
MCV: 92.2 fL (ref 79.3–98.0)
MONO#: 1 10*3/uL — AB (ref 0.1–0.9)
MONO%: 8.7 % (ref 0.0–14.0)
NEUT#: 8.3 10*3/uL — ABNORMAL HIGH (ref 1.5–6.5)
NEUT%: 72.4 % (ref 39.0–75.0)
PLATELETS: 203 10*3/uL (ref 140–400)
RBC: 4.55 10*6/uL (ref 4.20–5.82)
RDW: 14.6 % (ref 11.0–14.6)
WBC: 11.4 10*3/uL — ABNORMAL HIGH (ref 4.0–10.3)

## 2017-06-11 LAB — COMPREHENSIVE METABOLIC PANEL
ALT: 14 U/L (ref 0–55)
ANION GAP: 8 meq/L (ref 3–11)
AST: 15 U/L (ref 5–34)
Albumin: 3.8 g/dL (ref 3.5–5.0)
Alkaline Phosphatase: 119 U/L (ref 40–150)
BUN: 8.3 mg/dL (ref 7.0–26.0)
CHLORIDE: 106 meq/L (ref 98–109)
CO2: 23 meq/L (ref 22–29)
Calcium: 9.5 mg/dL (ref 8.4–10.4)
Creatinine: 0.8 mg/dL (ref 0.7–1.3)
GLUCOSE: 129 mg/dL (ref 70–140)
POTASSIUM: 3.7 meq/L (ref 3.5–5.1)
SODIUM: 137 meq/L (ref 136–145)
Total Bilirubin: 0.46 mg/dL (ref 0.20–1.20)
Total Protein: 7.8 g/dL (ref 6.4–8.3)

## 2017-06-11 MED ORDER — SODIUM CHLORIDE 0.9 % IV SOLN
Freq: Once | INTRAVENOUS | Status: AC
  Administered 2017-06-11: 11:00:00 via INTRAVENOUS

## 2017-06-11 MED ORDER — SODIUM CHLORIDE 0.9 % IV SOLN
2400.0000 mg/m2 | INTRAVENOUS | Status: DC
  Administered 2017-06-11: 5000 mg via INTRAVENOUS
  Filled 2017-06-11: qty 100

## 2017-06-11 MED ORDER — SODIUM CHLORIDE 0.9% FLUSH
10.0000 mL | INTRAVENOUS | Status: DC | PRN
  Administered 2017-06-11: 10 mL via INTRAVENOUS
  Filled 2017-06-11: qty 10

## 2017-06-11 MED ORDER — LEUCOVORIN CALCIUM INJECTION 350 MG: 400.0000 mg/m2 | Freq: Once | INTRAVENOUS | Status: DC

## 2017-06-11 MED ORDER — FLUOROURACIL CHEMO INJECTION 2.5 GM/50ML
400.0000 mg/m2 | Freq: Once | INTRAVENOUS | Status: AC
  Administered 2017-06-11: 850 mg via INTRAVENOUS
  Filled 2017-06-11: qty 17

## 2017-06-11 MED ORDER — SODIUM CHLORIDE 0.9 % IV SOLN
400.0000 mg/m2 | Freq: Once | INTRAVENOUS | Status: AC
  Administered 2017-06-11: 832 mg via INTRAVENOUS
  Filled 2017-06-11: qty 41.6

## 2017-06-11 NOTE — Progress Notes (Signed)
[facility] OFFICE PROGRESS NOTE   Diagnosis: Esophagus cancer  INTERVAL HISTORY:   Justin [name] returns as scheduled.  He continues 5-fluorouracil every 3 weeks.  No mouth sores or diarrhea.  He reports persistent numbness in the hands.  He has noted tightness in the sinuses.  He was seen in the emergency room with gross hematuria 05/24/2017.  He received bladder irrigation and was discharged.  He has been referred to urology.  He reports seeing Dr. Gaynelle Arabian for hematuria in the past.  No other bleeding.  Objective:  Vital signs in last 24 hours:  Blood pressure (!) 146/81, pulse 85, temperature 97.7 F (36.5 C), temperature source Oral, resp. rate 18, height 5' 10.5" (1.791 m), weight 199 lb 6.4 oz (90.4 kg), SpO2 96 %.    HEENT: No thrush or ulcers Resp: Distant breath sounds, no respiratory distress Cardio: Regular rate and rhythm GI: No hepatomegaly, no mass, nontender Vascular: No leg edema Skin: Slight venous engorgement at the right upper anterior chest  Portacath/PICC-without erythema  Lab Results:  Lab Results  Component Value Date   WBC 11.4 (H) 06/11/2017   HGB 14.0 06/11/2017   HCT 42.0 06/11/2017   MCV 92.2 06/11/2017   PLT 203 06/11/2017   NEUTROABS 8.3 (H) 06/11/2017    CMP     Component Value Date/Time   NA 137 06/11/2017 0807   K 3.7 06/11/2017 0807   CL 102 05/24/2017 2241   CO2 23 06/11/2017 0807   GLUCOSE 129 06/11/2017 0807   BUN 8.3 06/11/2017 0807   CREATININE 0.8 06/11/2017 0807   CALCIUM 9.5 06/11/2017 0807   PROT 7.8 06/11/2017 0807   ALBUMIN 3.8 06/11/2017 0807   AST 15 06/11/2017 0807   ALT 14 06/11/2017 0807   ALKPHOS 119 06/11/2017 0807   BILITOT 0.46 06/11/2017 0807   GFRNONAA >60 05/24/2017 2241   GFRAA >60 05/24/2017 2241    Lab Results  Component Value Date   CEA1 4.07 03/06/2017    Medications: I have reviewed the patient's current medications.  Assessment/Plan: 1. Adenocarcinoma of the distal  esophagus-EGD 08/18/2016 confirmed a distal esophagus mass, HER-2-negative ? Staging CTs 08/18/2016 with nonspecific pulmonary nodules, liver metastases, borderline thoracic and gastrohepatic ligament lymphadenopathy ? Cycle 1 FOLFOX 09/04/2016 ? Cycle 2 FOLFOX 09/18/2016 ? Cycle 3 FOLFOX 10/02/2016 ? Cycle 4 FOLFOX 10/16/2016 ? Cycle 5 FOLFOX 10/30/2016 ? Restaging CTs 11/10/2016-decreased wall thickness distal esophagus; stable borderline mediastinal lymphadenopathy; multiple liver metastases have completely resolved; stable scattered nonspecific tiny bilateral lung nodules ? Cycle 6 FOLFOX 11/13/2016 ? Cycle 7 FOLFOX 11/27/2016 ? Cycle 8 FOLFOX 12/11/2016 ? Cycle 9 FOLFOX 12/25/2016 ? Cycle 10 FOLFOX (oxaliplatin deleted) 01/08/2017 ? Initiation of maintenance therapy with 5-FU/leucovorin 02/06/2017 ? CTs 03/16/2017-stable pulmonary nodules, no esophagus mass, 1 subtle liver lesion ? Maintenance therapy continued with 5-FU/leucovorin every 3 weeks beginning 03/20/2017 2. Diabetes  3. COPD  4. Hypertension  5. History of colon polyps  6. Port-A-Cath placement 08/30/2016  7. 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  8. Right upper extremity deep vein thrombosis, Port-A-Cath related 08/24/2018started Xarelto 03/16/2017, possible subsegmental right lower lobe pulmonary embolus on CT 03/16/2017  9.  Gross hematuria 05/24/2017-status post bladder irrigation in the emergency room, referred to urology   Disposition:  Mr. Conry appears unchanged.  He will complete another cycle of 5-fluorouracil today.  He will undergo a restaging CT evaluation prior to a follow-up visit in 3 weeks. He will follow-up with urology for  evaluation of the hematuria.  I doubt this is related to the esophagus cancer diagnosis or 5-fluorouracil therapy.  The hematuria may be related to BPH and Xarelto.  Betsy Coder, MD  06/11/2017  10:07  AM

## 2017-06-11 NOTE — Patient Instructions (Signed)
[facility] Discharge Instructions for Patients Receiving Chemotherapy  Today you received the following chemotherapy agents: Leucovorin, Adrucil  To help prevent nausea and vomiting after your treatment, we encourage you to take your nausea medication as directed  If you develop nausea and vomiting that is not controlled by your nausea medication, call the clinic.   BELOW ARE SYMPTOMS THAT SHOULD BE REPORTED IMMEDIATELY:  *FEVER GREATER THAN 100.5 F  *CHILLS WITH OR WITHOUT FEVER  NAUSEA AND VOMITING THAT IS NOT CONTROLLED WITH YOUR NAUSEA MEDICATION  *UNUSUAL SHORTNESS OF BREATH  *UNUSUAL BRUISING OR BLEEDING  TENDERNESS IN MOUTH AND THROAT WITH OR WITHOUT PRESENCE OF ULCERS  *URINARY PROBLEMS  *BOWEL PROBLEMS  UNUSUAL RASH Items with * indicate a potential emergency and should be followed up as soon as possible.  Feel free to call the clinic should you have any questions or concerns. The clinic phone number is [phone].  Please show the CHEMO ALERT CARD at check-in to the Emergency Department and triage nurse.   

## 2017-06-13 ENCOUNTER — Ambulatory Visit (HOSPITAL_BASED_OUTPATIENT_CLINIC_OR_DEPARTMENT_OTHER): Payer: Medicare Other

## 2017-06-13 VITALS — BP 161/82 | HR 80 | Temp 98.0°F | Resp 18

## 2017-06-13 DIAGNOSIS — C155 Malignant neoplasm of lower third of esophagus: Secondary | ICD-10-CM

## 2017-06-13 MED ORDER — SODIUM CHLORIDE 0.9% FLUSH
10.0000 mL | INTRAVENOUS | Status: DC | PRN
  Administered 2017-06-13: 10 mL
  Filled 2017-06-13: qty 10

## 2017-06-13 MED ORDER — HEPARIN SOD (PORK) LOCK FLUSH 100 UNIT/ML IV SOLN
500.0000 [IU] | Freq: Once | INTRAVENOUS | Status: AC | PRN
Start: 2017-06-13 — End: 2017-06-13
  Administered 2017-06-13: 500 [IU]
  Filled 2017-06-13: qty 5

## 2017-06-29 ENCOUNTER — Encounter (HOSPITAL_COMMUNITY): Payer: Self-pay

## 2017-06-29 ENCOUNTER — Ambulatory Visit (HOSPITAL_COMMUNITY)
Admission: RE | Admit: 2017-06-29 | Discharge: 2017-06-29 | Disposition: A | Discharge status: Home / Self Care | Payer: Medicare Other | Source: Ambulatory Visit | Attending: Nurse Practitioner | Admitting: Nurse Practitioner

## 2017-06-29 DIAGNOSIS — J439 Emphysema, unspecified: Secondary | ICD-10-CM | POA: Diagnosis not present

## 2017-06-29 DIAGNOSIS — C787 Secondary malignant neoplasm of liver and intrahepatic bile duct: Secondary | ICD-10-CM | POA: Insufficient documentation

## 2017-06-29 DIAGNOSIS — C155 Malignant neoplasm of lower third of esophagus: Secondary | ICD-10-CM | POA: Diagnosis not present

## 2017-06-29 DIAGNOSIS — I700 Atherosclerosis of aorta: Secondary | ICD-10-CM | POA: Insufficient documentation

## 2017-06-29 DIAGNOSIS — R609 Edema, unspecified: Secondary | ICD-10-CM | POA: Diagnosis not present

## 2017-06-29 DIAGNOSIS — C159 Malignant neoplasm of esophagus, unspecified: Secondary | ICD-10-CM | POA: Diagnosis not present

## 2017-06-29 MED ORDER — IOPAMIDOL (ISOVUE-300) INJECTION 61%
100.0000 mL | Freq: Once | INTRAVENOUS | Status: AC | PRN
  Administered 2017-06-29: 100 mL via INTRAVENOUS

## 2017-06-29 MED ORDER — IOPAMIDOL (ISOVUE-300) INJECTION 61%
INTRAVENOUS | Status: AC
  Filled 2017-06-29: qty 100

## 2017-07-01 ENCOUNTER — Other Ambulatory Visit: Payer: Self-pay | Admitting: Oncology

## 2017-07-02 ENCOUNTER — Ambulatory Visit: Payer: Medicare Other | Admitting: Family Medicine

## 2017-07-03 ENCOUNTER — Encounter: Payer: Self-pay | Admitting: Oncology

## 2017-07-03 ENCOUNTER — Ambulatory Visit: Payer: Medicare Other

## 2017-07-03 ENCOUNTER — Telehealth: Payer: Self-pay | Admitting: Oncology

## 2017-07-03 ENCOUNTER — Ambulatory Visit (HOSPITAL_BASED_OUTPATIENT_CLINIC_OR_DEPARTMENT_OTHER): Payer: Medicare Other | Admitting: Oncology

## 2017-07-03 ENCOUNTER — Other Ambulatory Visit (HOSPITAL_BASED_OUTPATIENT_CLINIC_OR_DEPARTMENT_OTHER): Payer: Medicare Other

## 2017-07-03 VITALS — BP 155/91 | HR 95 | Temp 97.8°F | Resp 18 | Ht 70.5 in | Wt 202.3 lb

## 2017-07-03 DIAGNOSIS — Z95828 Presence of other vascular implants and grafts: Secondary | ICD-10-CM

## 2017-07-03 DIAGNOSIS — I10 Essential (primary) hypertension: Secondary | ICD-10-CM | POA: Diagnosis not present

## 2017-07-03 DIAGNOSIS — C155 Malignant neoplasm of lower third of esophagus: Secondary | ICD-10-CM

## 2017-07-03 LAB — COMPREHENSIVE METABOLIC PANEL
ALK PHOS: 124 U/L (ref 40–150)
ALT: 18 U/L (ref 0–55)
ANION GAP: 11 meq/L (ref 3–11)
AST: 19 U/L (ref 5–34)
Albumin: 3.9 g/dL (ref 3.5–5.0)
BILIRUBIN TOTAL: 0.54 mg/dL (ref 0.20–1.20)
BUN: 10.1 mg/dL (ref 7.0–26.0)
CALCIUM: 9.3 mg/dL (ref 8.4–10.4)
CO2: 20 mEq/L — ABNORMAL LOW (ref 22–29)
CREATININE: 0.8 mg/dL (ref 0.7–1.3)
Chloride: 105 mEq/L (ref 98–109)
Glucose: 147 mg/dl — ABNORMAL HIGH (ref 70–140)
Potassium: 3.7 mEq/L (ref 3.5–5.1)
Sodium: 136 mEq/L (ref 136–145)
TOTAL PROTEIN: 7.9 g/dL (ref 6.4–8.3)

## 2017-07-03 LAB — CBC WITH DIFFERENTIAL/PLATELET
BASO%: 0.3 % (ref 0.0–2.0)
Basophils Absolute: 0.1 10*3/uL (ref 0.0–0.1)
EOS%: 1.9 % (ref 0.0–7.0)
Eosinophils Absolute: 0.3 10*3/uL (ref 0.0–0.5)
HCT: 43.7 % (ref 38.4–49.9)
HGB: 14.6 g/dL (ref 13.0–17.1)
LYMPH#: 2.4 10*3/uL (ref 0.9–3.3)
LYMPH%: 16.4 % (ref 14.0–49.0)
MCH: 31 pg (ref 27.2–33.4)
MCHC: 33.4 g/dL (ref 32.0–36.0)
MCV: 92.8 fL (ref 79.3–98.0)
MONO#: 1.2 10*3/uL — ABNORMAL HIGH (ref 0.1–0.9)
MONO%: 8.5 % (ref 0.0–14.0)
NEUT%: 72.9 % (ref 39.0–75.0)
NEUTROS ABS: 10.5 10*3/uL — AB (ref 1.5–6.5)
Platelets: 189 10*3/uL (ref 140–400)
RBC: 4.71 10*6/uL (ref 4.20–5.82)
RDW: 14.5 % (ref 11.0–14.6)
WBC: 14.4 10*3/uL — AB (ref 4.0–10.3)

## 2017-07-03 LAB — CEA (IN HOUSE-CHCC): CEA (CHCC-IN HOUSE): 148.45 ng/mL — AB (ref 0.00–5.00)

## 2017-07-03 MED ORDER — HEPARIN SOD (PORK) LOCK FLUSH 100 UNIT/ML IV SOLN
500.0000 [IU] | Freq: Once | INTRAVENOUS | Status: AC | PRN
  Administered 2017-07-03: 500 [IU] via INTRAVENOUS
  Filled 2017-07-03: qty 5

## 2017-07-03 MED ORDER — SODIUM CHLORIDE 0.9% FLUSH
10.0000 mL | INTRAVENOUS | Status: DC | PRN
  Administered 2017-07-03: 10 mL via INTRAVENOUS
  Filled 2017-07-03: qty 10

## 2017-07-03 NOTE — Telephone Encounter (Signed)
Gave patient avs and calendar for December and January waiting on 12/19 and 1/2

## 2017-07-03 NOTE — Progress Notes (Signed)
[place] OFFICE PROGRESS NOTE   Diagnosis: Esophagus cancer  INTERVAL HISTORY:   Justin [name] returns as scheduled.  He was last treated with 5-fluorouracil 06/11/2017.  He reports facial swelling, rhinorrhea, and tearing of the eyes.  No dysphasia.  He continues to have neuropathy symptoms in the hands and feet.  Objective:  Vital signs in last 24 hours:  Blood pressure (!) 155/91, pulse 95, temperature 97.8 F (36.6 C), temperature source Oral, resp. rate 18, height 5' 10.5" (1.791 m), weight 202 lb 4.8 oz (91.8 kg), SpO2 93 %.    HEENT: No thrush or ulcers, no sinus tenderness Lymphatics: No cervical or supraclavicular nodes Resp: Lungs clear bilaterally Cardio: Regular rate and rhythm GI: No hepatomegaly, no mass, nontender Vascular: No leg edema    Portacath/PICC-without erythema  Lab Results:  Lab Results  Component Value Date   WBC 14.4 (H) 07/03/2017   HGB 14.6 07/03/2017   HCT 43.7 07/03/2017   MCV 92.8 07/03/2017   PLT 189 07/03/2017   NEUTROABS 10.5 (H) 07/03/2017    CMP     Component Value Date/Time   NA 137 06/11/2017 0807   K 3.7 06/11/2017 0807   CL 102 05/24/2017 2241   CO2 23 06/11/2017 0807   GLUCOSE 129 06/11/2017 0807   BUN 8.3 06/11/2017 0807   CREATININE 0.8 06/11/2017 0807   CALCIUM 9.5 06/11/2017 0807   PROT 7.8 06/11/2017 0807   ALBUMIN 3.8 06/11/2017 0807   AST 15 06/11/2017 0807   ALT 14 06/11/2017 0807   ALKPHOS 119 06/11/2017 0807   BILITOT 0.46 06/11/2017 0807   GFRNONAA >60 05/24/2017 2241   GFRAA >60 05/24/2017 2241    Lab Results  Component Value Date   CEA1 4.07 03/06/2017     Imaging:  Ct Soft Tissue Neck W Contrast  Result Date: 06/29/2017 CLINICAL DATA:  Re- stage esophageal neoplasm. EXAM: CT NECK WITH CONTRAST TECHNIQUE: Multidetector CT imaging of the neck was performed using the standard protocol following the bolus administration of intravenous contrast. CONTRAST:  194m ISOVUE-300  IOPAMIDOL (ISOVUE-300) INJECTION 61% COMPARISON:  03/16/2017 FINDINGS: Study done in conjunction with chest abdomen pelvis CT and there is early timing of the neck. This is not limit detection of adenopathy in this case. Pharynx and larynx: No noted mass or inflammation. 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 A right supraclavicular node measuring 18 mm by 8 mm previously 18 x 12 mm on axial slices. 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 Atherosclerotic calcification of the carotids. Limited intracranial: Negative Visualized orbits: Negative Mastoids and visualized paranasal sinuses: Clear other than a retention cyst on the floor of the left maxillary sinus. Skeleton: Cervical disc and facet degeneration. No acute or aggressive finding. Upper chest: Reported separately IMPRESSION: Regression of right supraclavicular nodes and neck edema which was likely related to DVT on the prior study. The supraclavicular nodes should be visible on future chest CT follow ups. No suspicious nodes in the jugular chains. Electronically Signed   By: JMonte FantasiaM.D.   On: 06/29/2017 11:36   Ct Chest W Contrast  Result Date: 06/29/2017 CLINICAL DATA:  Esophageal cancer EXAM: CT CHEST, ABDOMEN, AND PELVIS WITH CONTRAST TECHNIQUE: Multidetector CT imaging of the chest, abdomen and pelvis was performed  following the standard protocol during bolus administration of intravenous contrast. CONTRAST:  138m ISOVUE-300 IOPAMIDOL (ISOVUE-300) INJECTION 61% COMPARISON:  03/16/2017 FINDINGS: CT CHEST FINDINGS Cardiovascular: Normal heart size. No pericardial effusion identified. Aortic atherosclerosis. Calcification in the LAD, left circumflex and RCA  coronary artery is noted. Mediastinum/Nodes: The trachea is patent and midline. Unremarkable appearance of the esophagus. Index right paratracheal node measures 11 mm, image 29 of series 2. Previously 10 mm. Lungs/Pleura: No pleural effusion identified. Mild changes of emphysema. Right upper lobe pulmonary nodule measures 5 mm, image 66 of series 10. Unchanged from previous exam. Calcified granuloma noted within the periphery of the right upper lobe. Stable 5 mm subpleural nodule in the posterior left upper lobe, image 57 of series 10. Lower lobe predominant increase interstitial markings noted bilaterally including interstitial reticulation. Musculoskeletal: No aggressive lytic or sclerotic bone lesions. CT ABDOMEN PELVIS FINDINGS Hepatobiliary: There has been interval development of multifocal low-attenuation lesions in the liver compatible with metastatic disease. Index lesion within segment 8 measures 2.9 cm, image 59 of series 3. Segment 7 lesion measures 2.9 cm, image 59 of series 3. Posterior right lobe of liver lesion measures 2.5 cm, image 62 of series 3. Gallbladder normal. No biliary dilatation. Pancreas: Normal appearance of the pancreas. Spleen: Normal in size without focal abnormality. Adrenals/Urinary Tract: The adrenal glands are normal. The kidneys are unremarkable. No mass or hydronephrosis. Urinary bladder is unremarkable. Stomach/Bowel: Stomach is within normal limits. Appendix appears normal. No evidence of bowel wall thickening, distention, or inflammatory changes. Vascular/Lymphatic: Aortic atherosclerosis. No aneurysm. Portacaval lymph node measures 1.7 cm, image 72 of series 3. Previously 1.3 cm. No pelvic or inguinal adenopathy. Reproductive: Prostate gland is enlarged measuring 5.1 by 4.9 by 7.3 cm (volume = 96 cm^3). Other: No abdominal wall hernia or abnormality. No abdominopelvic ascites. Musculoskeletal: No acute or significant osseous findings. IMPRESSION: 1. Interval progression of  disease. Interval development of multifocal liver metastasis. Portacaval node within the upper abdomen is increased in size in the interval. 2. Stable small nonspecific pulmonary nodules. 3. Aortic Atherosclerosis (ICD10-I70.0) and Emphysema (ICD10-J43.9). Electronically Signed   By: TKerby MoorsM.D.   On: 06/29/2017 14:03   Ct Abdomen Pelvis W Contrast  Result Date: 06/29/2017 CLINICAL DATA:  Esophageal cancer EXAM: CT CHEST, ABDOMEN, AND PELVIS WITH CONTRAST TECHNIQUE: Multidetector CT imaging of the chest, abdomen and pelvis was performed following the standard protocol during bolus administration of intravenous contrast. CONTRAST:  1032mISOVUE-300 IOPAMIDOL (ISOVUE-300) INJECTION 61% COMPARISON:  03/16/2017 FINDINGS: CT CHEST FINDINGS Cardiovascular: Normal heart size. No pericardial effusion identified. Aortic atherosclerosis. Calcification in the LAD, left circumflex and RCA coronary artery is noted. Mediastinum/Nodes: The trachea is patent and midline. Unremarkable appearance of the esophagus. Index right paratracheal node measures 11 mm, image 29 of series 2. Previously 10 mm. Lungs/Pleura: No pleural effusion identified. Mild changes of emphysema. Right upper lobe pulmonary nodule measures 5 mm, image 66 of series 10. Unchanged from previous exam. Calcified granuloma noted within the periphery of the right upper lobe. Stable 5 mm subpleural nodule in the posterior left upper lobe, image 57 of series 10. Lower lobe predominant increase interstitial markings noted bilaterally including interstitial reticulation. Musculoskeletal: No aggressive lytic or sclerotic bone lesions. CT ABDOMEN PELVIS FINDINGS Hepatobiliary: There has been interval development of multifocal low-attenuation lesions in the liver compatible with metastatic disease. Index lesion within segment 8 measures 2.9 cm, image 59 of series 3. Segment 7 lesion measures 2.9 cm, image 59 of series 3. Posterior  right lobe of liver lesion  measures 2.5 cm, image 62 of series 3. Gallbladder normal. No biliary dilatation. Pancreas: Normal appearance of the pancreas. Spleen: Normal in size without focal abnormality. Adrenals/Urinary Tract: The adrenal glands are normal. The kidneys are unremarkable. No mass or hydronephrosis. Urinary bladder is unremarkable. Stomach/Bowel: Stomach is within normal limits. Appendix appears normal. No evidence of bowel wall thickening, distention, or inflammatory changes. Vascular/Lymphatic: Aortic atherosclerosis. No aneurysm. Portacaval lymph node measures 1.7 cm, image 72 of series 3. Previously 1.3 cm. No pelvic or inguinal adenopathy. Reproductive: Prostate gland is enlarged measuring 5.1 by 4.9 by 7.3 cm (volume = 96 cm^3). Other: No abdominal wall hernia or abnormality. No abdominopelvic ascites. Musculoskeletal: No acute or significant osseous findings. IMPRESSION: 1. Interval progression of disease. Interval development of multifocal liver metastasis. Portacaval node within the upper abdomen is increased in size in the interval. 2. Stable small nonspecific pulmonary nodules. 3. Aortic Atherosclerosis (ICD10-I70.0) and Emphysema (ICD10-J43.9). Electronically Signed   By: Kerby Moors M.D.   On: 06/29/2017 14:03    Medications: I have reviewed the patient's current medications.  Assessment/Plan: 1. 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 Staging CTs 08/18/2016 with nonspecific pulmonary nodules, liver metastases, borderline thoracic and gastrohepatic ligament lymphadenopathy ? Cycle 1 FOLFOX 09/04/2016 ? Cycle 2 FOLFOX 09/18/2016 ? Cycle 3 FOLFOX 10/02/2016 ? Cycle 4 FOLFOX 10/16/2016 ? Cycle 5 FOLFOX 10/30/2016 ? Restaging CTs 11/10/2016-decreased wall thickness distal esophagus; stable borderline mediastinal lymphadenopathy; multiple liver metastases have completely resolved; stable scattered nonspecific tiny bilateral lung  nodules ? Cycle 6 FOLFOX 11/13/2016 ? Cycle 7 FOLFOX 11/27/2016 ? Cycle 8 FOLFOX 12/11/2016 ? Cycle 9 FOLFOX 12/25/2016 ? Cycle 10 FOLFOX (oxaliplatin deleted) 01/08/2017 ? Initiation of maintenance therapy with 5-FU/leucovorin 02/06/2017 ? CTs 03/16/2017-stable pulmonary nodules, no esophagus mass, 1 subtle liver lesion ? Maintenance therapy continued with 5-FU/leucovorin every 3 weeks beginning 03/20/2017 ? CTs 06/29/2017-progressive liver metastases 2. Diabetes  3. COPD  4. Hypertension  5. History of colon polyps  6. Port-A-Cath placement 08/30/2016  7. 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  8. Right upper extremity deep vein thrombosis, Port-A-Cath related 08/24/2018started Xarelto 03/16/2017, possible subsegmental right lower lobe pulmonary embolus on CT 03/16/2017  9.  Gross hematuria 05/24/2017-status post bladder irrigation in the emergency room, referred to urology   Disposition:  Justin [name] has been treated with maintenance 5-fluorouracil since July.  The restaging CT confirms disease progression in the liver.  I reviewed the CT images with Justin [name].  We discussed treatment options.  He agrees to salvage treatment with Taxol/ramucirumab.  We reviewed the potential toxicities associated with Taxol including the chance of allergic reaction, hematologic toxicity, and neuropathy.  We discussed the hypertension, allergic reaction, thromboembolic disease, and bowel perforation seen with ramucirumab.  He agrees to proceed.  The plan is to begin treatment 07/11/2017.  He will return for an office visit and treatment on 07/18/2017.  25 minutes were spent with the patient today.  The majority of the time was used for counseling and coordination of care.  Betsy Coder, MD  07/03/2017  10:44 AM

## 2017-07-03 NOTE — Progress Notes (Signed)
DISCONTINUE ON PATHWAY REGIMEN - Gastroesophageal     A cycle is every 14 days:     Oxaliplatin        Dose Mod: None     Leucovorin        Dose Mod: None     5-Fluorouracil        Dose Mod: None     5-Fluorouracil        Dose Mod: None  **Always confirm dose/schedule in your pharmacy ordering system**    REASON: Disease Progression PRIOR TREATMENT: GEOS3: mFOLFOX6 q14 Days Until Progression or Unacceptable Toxicity TREATMENT RESPONSE: Partial Response (PR)  START ON PATHWAY REGIMEN - Gastroesophageal     A cycle is every 28 days:     Ramucirumab      Paclitaxel   **Always confirm dose/schedule in your pharmacy ordering system**    Patient Characteristics: Distant Metastases (cM1/pM1) / Locally Recurrent Disease, Adenocarcinoma - Esophageal, GE Junction, and Gastric, Second Line, MSS / pMMR or MSI Unknown Histology: Adenocarcinoma Disease Classification: Esophageal Therapeutic Status: Distant Metastases (No Additional Staging) Would you be surprised if this patient died  in the next year<= I would NOT be surprised if this patient died in the next year Line of Therapy: Second Line Microsatellite/Mismatch Repair Status: MSS/pMMR Intent of Therapy: Non-Curative / Palliative Intent, Discussed with Patient

## 2017-07-05 ENCOUNTER — Other Ambulatory Visit: Payer: Self-pay | Admitting: *Deleted

## 2017-07-05 MED ORDER — DEXAMETHASONE 2 MG PO TABS: ORAL_TABLET | ORAL | 0 refills | Status: DC

## 2017-07-05 NOTE — Telephone Encounter (Signed)
Called pt with Decadron pre-med instructions for first Taxol scheduled 07/11/17. Instructed him to check glucose level daily for 3 days beginning 12/19. Call office if greater than 400. He agreed to do so.Teach back complete.

## 2017-07-10 ENCOUNTER — Ambulatory Visit (INDEPENDENT_AMBULATORY_CARE_PROVIDER_SITE_OTHER): Payer: Medicare Other | Admitting: Family Medicine

## 2017-07-10 ENCOUNTER — Telehealth: Payer: Self-pay | Admitting: Family Medicine

## 2017-07-10 ENCOUNTER — Other Ambulatory Visit: Payer: Self-pay

## 2017-07-10 ENCOUNTER — Other Ambulatory Visit: Payer: Self-pay | Admitting: Family Medicine

## 2017-07-10 ENCOUNTER — Telehealth: Payer: Self-pay | Admitting: Oncology

## 2017-07-10 ENCOUNTER — Encounter: Payer: Self-pay | Admitting: Family Medicine

## 2017-07-10 VITALS — BP 122/80 | HR 84 | Ht 70.0 in | Wt 200.2 lb

## 2017-07-10 DIAGNOSIS — E118 Type 2 diabetes mellitus with unspecified complications: Secondary | ICD-10-CM | POA: Diagnosis not present

## 2017-07-10 DIAGNOSIS — E785 Hyperlipidemia, unspecified: Secondary | ICD-10-CM

## 2017-07-10 DIAGNOSIS — I10 Essential (primary) hypertension: Secondary | ICD-10-CM | POA: Diagnosis not present

## 2017-07-10 DIAGNOSIS — G620 Drug-induced polyneuropathy: Secondary | ICD-10-CM | POA: Diagnosis not present

## 2017-07-10 DIAGNOSIS — E1169 Type 2 diabetes mellitus with other specified complication: Secondary | ICD-10-CM

## 2017-07-10 DIAGNOSIS — T451X5A Adverse effect of antineoplastic and immunosuppressive drugs, initial encounter: Secondary | ICD-10-CM

## 2017-07-10 DIAGNOSIS — C155 Malignant neoplasm of lower third of esophagus: Secondary | ICD-10-CM | POA: Diagnosis not present

## 2017-07-10 DIAGNOSIS — E1159 Type 2 diabetes mellitus with other circulatory complications: Secondary | ICD-10-CM | POA: Diagnosis not present

## 2017-07-10 MED ORDER — BLOOD GLUCOSE MONITOR KIT: PACK | 0 refills | Status: DC

## 2017-07-10 MED ORDER — BLOOD GLUCOSE MONITOR KIT: PACK | 0 refills | Status: AC

## 2017-07-10 MED ORDER — LISINOPRIL-HYDROCHLOROTHIAZIDE 10-12.5 MG PO TABS: 1.0000 | ORAL_TABLET | Freq: Every day | ORAL | 3 refills | Status: AC

## 2017-07-10 NOTE — Progress Notes (Signed)
Subjective:    Patient ID: Justin [name], male    DOB: January 04, 1951, 66 y.o.   MRN: [id number]  Justin [name] is a 66 y.o. male who presents for follow-up of Type 2 diabetes mellitus.  Patient is checking home blood sugars. (meter gave out, checking into getting a new one)   Home blood sugar records: 100-120 How often is blood sugars being checked:  Every two days once daily Current symptoms/problems include diabetes he is also being followed by oncology for his underlying esophageal cancer and now evidence of spread to the liver. Daily foot checks: no   Any foot concerns: tingling feet  Last eye exam: years ago. Exercise: The patient does not participate in regular exercise at present. He has stopped taking his Zocor stating it caused leg cramping.  He has been off of this for the last 6 months.  He was seen on December 11 by his oncologist and there is evidence of progression of the disease now showing lesions in the liver.  He also has chemotherapy related neuropathy but seems to be handling this fairly well.  He will be started on a new regimen including steroids.  He was given information concerning this by the oncologist.  He does continue on lisinopril.  He seems to have a fairly good attitude towards all of this.  Also of note is the fact that his wife is now suffering from lung cancer and going through radiation and chemotherapy. The following portions of the patient's history were reviewed and updated as appropriate: allergies, current medications, past medical history, past social history and problem list.  ROS as in subjective above.     Objective:    Physical Exam Alert and in no distress otherwise not examined.  A1c is 6.7 Lab Review Diabetic Labs Latest Ref Rng & Units 07/03/2017 06/11/2017 05/24/2017 05/22/2017 05/01/2017  HbA1c - - - - - -  Microalbumin mg/L - - - - -  Micro/Creat Ratio - - - - - -  Chol <200 mg/dL - - - - -  HDL >40 mg/dL - - - - -  Calc LDL <100 mg/dL  - - - - -  Triglycerides <150 mg/dL - - - - -  Creatinine 0.7 - 1.3 mg/dL 0.8 0.8 0.88 0.8 0.8   BP/Weight 07/03/2017 06/13/2017 06/11/2017 05/25/2017 17/11/1023  Systolic BP 852 778 242 353 614  Diastolic BP 91 82 81 76 74  Wt. (Lbs) 202.3 - 199.4 - 212  BMI 28.62 - 28.21 - 29.99   Foot/eye exam completion dates 01/28/2016 01/29/2015  Foot Form Completion Done Done    Justin [name]  reports that he has been smoking cigarettes.  He has a 50.00 pack-year smoking history. he has never used smokeless tobacco. He reports that he does not drink alcohol or use drugs.     Assessment & Plan:    Type 2 diabetes mellitus with complication, without long-term current use of insulin ([place]) - Plan: blood glucose meter kit and supplies KIT  Hypertension associated with diabetes ([place]) - Plan: lisinopril-hydrochlorothiazide (PRINZIDE,ZESTORETIC) 10-12.5 MG tablet  Cancer of distal third of esophagus ([facility])  Hyperlipidemia associated with type 2 diabetes mellitus ([place])  Neuropathy due to chemotherapeutic drug ([place])   1. Rx changes: none 2. Education: Reviewed 'ABCs' of diabetes management (respective goals in parentheses):  A1C (<7), blood pressure (<130/80), and cholesterol (LDL <100). 3. Compliance at present is estimated to be good. Efforts to improve compliance (if necessary) will be directed at No change.  4. Follow up: 4 months Overall he seems to be handling the situation well.  Discussed the stress that he is under and the possibility of using an antidepressant but at this point he is not interested.  I will hold off on placing him on any more medications specifically a statin drug.  Discussed treating the neuropathy but again at this point he is not interested.

## 2017-07-10 NOTE — Telephone Encounter (Signed)
Called patient regarding 12/20

## 2017-07-11 ENCOUNTER — Ambulatory Visit (HOSPITAL_BASED_OUTPATIENT_CLINIC_OR_DEPARTMENT_OTHER): Payer: Medicare Other

## 2017-07-11 ENCOUNTER — Other Ambulatory Visit (HOSPITAL_BASED_OUTPATIENT_CLINIC_OR_DEPARTMENT_OTHER): Payer: Medicare Other

## 2017-07-11 DIAGNOSIS — C155 Malignant neoplasm of lower third of esophagus: Secondary | ICD-10-CM

## 2017-07-11 DIAGNOSIS — Z87442 Personal history of urinary calculi: Secondary | ICD-10-CM

## 2017-07-11 DIAGNOSIS — Z95828 Presence of other vascular implants and grafts: Secondary | ICD-10-CM

## 2017-07-11 LAB — CBC WITH DIFFERENTIAL/PLATELET
BASO%: 0.7 % (ref 0.0–2.0)
Basophils Absolute: 0.1 10*3/uL (ref 0.0–0.1)
EOS%: 1.4 % (ref 0.0–7.0)
Eosinophils Absolute: 0.2 10*3/uL (ref 0.0–0.5)
HCT: 44.9 % (ref 38.4–49.9)
HEMOGLOBIN: 15 g/dL (ref 13.0–17.1)
LYMPH#: 2.3 10*3/uL (ref 0.9–3.3)
LYMPH%: 14.3 % (ref 14.0–49.0)
MCH: 30.5 pg (ref 27.2–33.4)
MCHC: 33.4 g/dL (ref 32.0–36.0)
MCV: 91.3 fL (ref 79.3–98.0)
MONO#: 1.1 10*3/uL — ABNORMAL HIGH (ref 0.1–0.9)
MONO%: 6.5 % (ref 0.0–14.0)
NEUT%: 77.1 % — ABNORMAL HIGH (ref 39.0–75.0)
NEUTROS ABS: 12.5 10*3/uL — AB (ref 1.5–6.5)
Platelets: 213 10*3/uL (ref 140–400)
RBC: 4.92 10*6/uL (ref 4.20–5.82)
RDW: 14.8 % — AB (ref 11.0–14.6)
WBC: 16.2 10*3/uL — AB (ref 4.0–10.3)

## 2017-07-11 LAB — COMPREHENSIVE METABOLIC PANEL
ALBUMIN: 3.8 g/dL (ref 3.5–5.0)
ALK PHOS: 142 U/L (ref 40–150)
ALT: 16 U/L (ref 0–55)
AST: 16 U/L (ref 5–34)
Anion Gap: 7 mEq/L (ref 3–11)
BILIRUBIN TOTAL: 0.5 mg/dL (ref 0.20–1.20)
BUN: 8.6 mg/dL (ref 7.0–26.0)
CO2: 26 mEq/L (ref 22–29)
CREATININE: 0.9 mg/dL (ref 0.7–1.3)
Calcium: 9.7 mg/dL (ref 8.4–10.4)
Chloride: 102 mEq/L (ref 98–109)
EGFR: >60 mL/min/{1.73_m2} (ref >60)
GLUCOSE: 189 mg/dL — AB (ref 70–140)
Potassium: 3.5 mEq/L (ref 3.5–5.1)
SODIUM: 135 meq/L — AB (ref 136–145)
TOTAL PROTEIN: 8 g/dL (ref 6.4–8.3)

## 2017-07-11 LAB — UA PROTEIN, DIPSTICK - CHCC: Protein, ur: NEGATIVE mg/dL

## 2017-07-11 LAB — CEA (IN HOUSE-CHCC): CEA (CHCC-IN HOUSE): 162.61 ng/mL — AB (ref 0.00–5.00)

## 2017-07-11 MED ORDER — HEPARIN SOD (PORK) LOCK FLUSH 100 UNIT/ML IV SOLN
500.0000 [IU] | Freq: Once | INTRAVENOUS | Status: AC | PRN
  Administered 2017-07-11: 500 [IU] via INTRAVENOUS
  Filled 2017-07-11: qty 5

## 2017-07-11 MED ORDER — SODIUM CHLORIDE 0.9% FLUSH
10.0000 mL | INTRAVENOUS | Status: DC | PRN
  Administered 2017-07-11: 10 mL via INTRAVENOUS
  Filled 2017-07-11: qty 10

## 2017-07-11 NOTE — Telephone Encounter (Signed)
dt ?

## 2017-07-12 ENCOUNTER — Other Ambulatory Visit: Payer: Self-pay | Admitting: Oncology

## 2017-07-12 ENCOUNTER — Ambulatory Visit (HOSPITAL_BASED_OUTPATIENT_CLINIC_OR_DEPARTMENT_OTHER): Payer: Medicare Other | Admitting: Medical

## 2017-07-12 ENCOUNTER — Ambulatory Visit (HOSPITAL_BASED_OUTPATIENT_CLINIC_OR_DEPARTMENT_OTHER): Payer: Medicare Other

## 2017-07-12 ENCOUNTER — Other Ambulatory Visit: Payer: Self-pay

## 2017-07-12 VITALS — BP 128/74 | HR 107 | Temp 98.0°F | Resp 16

## 2017-07-12 DIAGNOSIS — J449 Chronic obstructive pulmonary disease, unspecified: Secondary | ICD-10-CM

## 2017-07-12 DIAGNOSIS — Z5112 Encounter for antineoplastic immunotherapy: Secondary | ICD-10-CM | POA: Diagnosis not present

## 2017-07-12 DIAGNOSIS — T451X5A Adverse effect of antineoplastic and immunosuppressive drugs, initial encounter: Secondary | ICD-10-CM

## 2017-07-12 DIAGNOSIS — Z5111 Encounter for antineoplastic chemotherapy: Secondary | ICD-10-CM

## 2017-07-12 DIAGNOSIS — C155 Malignant neoplasm of lower third of esophagus: Secondary | ICD-10-CM | POA: Diagnosis not present

## 2017-07-12 MED ORDER — ACETAMINOPHEN 325 MG PO TABS
650.0000 mg | ORAL_TABLET | Freq: Once | ORAL | Status: AC
Start: 2017-07-12 — End: 2017-07-12
  Administered 2017-07-12: 650 mg via ORAL

## 2017-07-12 MED ORDER — SODIUM CHLORIDE 0.9 % IV SOLN
Freq: Once | INTRAVENOUS | Status: AC
  Administered 2017-07-12: 12:00:00 via INTRAVENOUS

## 2017-07-12 MED ORDER — SODIUM CHLORIDE 0.9 % IV SOLN
80.0000 mg/m2 | Freq: Once | INTRAVENOUS | Status: AC
  Administered 2017-07-12: 174 mg via INTRAVENOUS
  Filled 2017-07-12: qty 29

## 2017-07-12 MED ORDER — ALBUTEROL SULFATE (2.5 MG/3ML) 0.083% IN NEBU
2.5000 mg | INHALATION_SOLUTION | Freq: Once | RESPIRATORY_TRACT | Status: AC | PRN
  Administered 2017-07-12: 2.5 mg via RESPIRATORY_TRACT
  Filled 2017-07-12: qty 3

## 2017-07-12 MED ORDER — DEXAMETHASONE SODIUM PHOSPHATE 10 MG/ML IJ SOLN
10.0000 mg | Freq: Once | INTRAMUSCULAR | Status: AC
  Administered 2017-07-12: 10 mg via INTRAVENOUS

## 2017-07-12 MED ORDER — FAMOTIDINE IN NACL 20-0.9 MG/50ML-% IV SOLN
20.0000 mg | Freq: Once | INTRAVENOUS | Status: AC
Start: 2017-07-12 — End: 2017-07-12
  Administered 2017-07-12: 20 mg via INTRAVENOUS

## 2017-07-12 MED ORDER — DEXAMETHASONE SODIUM PHOSPHATE 10 MG/ML IJ SOLN
INTRAMUSCULAR | Status: AC
  Filled 2017-07-12: qty 1

## 2017-07-12 MED ORDER — DIPHENHYDRAMINE HCL 50 MG/ML IJ SOLN
INTRAMUSCULAR | Status: AC
  Filled 2017-07-12: qty 1

## 2017-07-12 MED ORDER — ACETAMINOPHEN 325 MG PO TABS
ORAL_TABLET | ORAL | Status: AC
  Filled 2017-07-12: qty 2

## 2017-07-12 MED ORDER — DIPHENHYDRAMINE HCL 50 MG/ML IJ SOLN
25.0000 mg | Freq: Once | INTRAMUSCULAR | Status: AC | PRN
  Administered 2017-07-12: 25 mg via INTRAVENOUS

## 2017-07-12 MED ORDER — HEPARIN SOD (PORK) LOCK FLUSH 100 UNIT/ML IV SOLN
500.0000 [IU] | Freq: Once | INTRAVENOUS | Status: AC | PRN
  Administered 2017-07-12: 500 [IU]
  Filled 2017-07-12: qty 5

## 2017-07-12 MED ORDER — METHYLPREDNISOLONE SODIUM SUCC 125 MG IJ SOLR
125.0000 mg | Freq: Once | INTRAMUSCULAR | Status: AC | PRN
  Administered 2017-07-12: 125 mg via INTRAVENOUS

## 2017-07-12 MED ORDER — DIPHENHYDRAMINE HCL 50 MG/ML IJ SOLN
25.0000 mg | Freq: Once | INTRAMUSCULAR | Status: AC
  Administered 2017-07-12: 25 mg via INTRAVENOUS

## 2017-07-12 MED ORDER — SODIUM CHLORIDE 0.9% FLUSH
10.0000 mL | INTRAVENOUS | Status: DC | PRN
  Administered 2017-07-12: 10 mL
  Filled 2017-07-12: qty 10

## 2017-07-12 MED ORDER — SODIUM CHLORIDE 0.9 % IV SOLN
8.0000 mg/kg | Freq: Once | INTRAVENOUS | Status: AC
  Administered 2017-07-12: 700 mg via INTRAVENOUS
  Filled 2017-07-12: qty 50

## 2017-07-12 MED ORDER — FAMOTIDINE IN NACL 20-0.9 MG/50ML-% IV SOLN
20.0000 mg | Freq: Once | INTRAVENOUS | Status: AC | PRN
  Administered 2017-07-12: 20 mg via INTRAVENOUS

## 2017-07-12 MED ORDER — SODIUM CHLORIDE 0.9 % IV SOLN: Freq: Once | INTRAVENOUS | Status: DC | PRN

## 2017-07-12 MED ORDER — FAMOTIDINE IN NACL 20-0.9 MG/50ML-% IV SOLN
INTRAVENOUS | Status: AC
  Filled 2017-07-12: qty 50

## 2017-07-12 MED ORDER — SODIUM CHLORIDE 0.9 % IV SOLN: 10.0000 mg | Freq: Once | INTRAVENOUS | Status: DC

## 2017-07-12 NOTE — Progress Notes (Signed)
1440 taxol increased 143ml/hr  For 80ml.  1441 pt reports chest pain, VS unstable. [place] at chairside emergency meds given. 1450 face flushed, chest pain, sweating, pupils constricted. Hives to neck.back. 1459 EKG taken and reviewed by, MD at chair side, Pt advised Chest pain is almost gone. MD evaluated pt, advised to run NS 189ml/hr x 30 mins. 1520 VSS

## 2017-07-12 NOTE — Patient Instructions (Signed)
[facility] Discharge Instructions for Patients Receiving Chemotherapy  Today you received the following chemotherapy agents Taxol and Cyramza  To help prevent nausea and vomiting after your treatment, we encourage you to take your nausea medication as directed.    If you develop nausea and vomiting that is not controlled by your nausea medication, call the clinic.   BELOW ARE SYMPTOMS THAT SHOULD BE REPORTED IMMEDIATELY:  *FEVER GREATER THAN 100.5 F  *CHILLS WITH OR WITHOUT FEVER  NAUSEA AND VOMITING THAT IS NOT CONTROLLED WITH YOUR NAUSEA MEDICATION  *UNUSUAL SHORTNESS OF BREATH  *UNUSUAL BRUISING OR BLEEDING  TENDERNESS IN MOUTH AND THROAT WITH OR WITHOUT PRESENCE OF ULCERS  *URINARY PROBLEMS  *BOWEL PROBLEMS  UNUSUAL RASH Items with * indicate a potential emergency and should be followed up as soon as possible.  Feel free to call the clinic should you have any questions or concerns. The clinic phone number is [phone].  Please show the CHEMO ALERT CARD at check-in to the Emergency Department and triage nurse.   

## 2017-07-13 NOTE — Progress Notes (Signed)
[facility] Progress Note   Justin [name] [id number] Jul 20, 1951 66 y.o.  Justin [name] is managed by Dr. Ladell Pier  Actively treated with chemotherapy: yes  Current Therapy: Cyramza and Taxol 11  Last Treated: 07/12/2017  Assessment: Plan:    Chemotherapy adverse reaction, initial encounter  Justin [name] was seen in the infusion room for a suspected chemotherapy reaction. He was receiving Taxol at the time of his reaction. He had received a total of 19-20 mL prior to onset of symptoms. His symptoms included: Hypertension, flushing, diaphoresis, and chest tightness. He was premedicated with Decadron prior to starting chemotherapy. Taxol was paused and DOMANI BAKOS was given Pepcid, Solu-Medrol, Benadryl, and an albuterol nebulizer after onset of his symptoms. Justin [name] did  respond to intervention.  The patient's symptoms resolved after 10-15 minutes. This case was discussed with Dr. Ladell Pier, who directed that the following be done: Dr. Benay Spice did not believe that the patient needed to be trans-referred to the emergency room given his EKG findings and response to his emergency medications.  Please see After Visit Summary for patient specific instructions.  Future Appointments  Date Time Provider [place]  07/18/2017 12:15 PM [facility] LAB 4 [facility] None  07/18/2017 12:30 PM [facility] FLUSH NURSE 2 [facility] None  07/18/2017  1:00 PM Ladell Pier, MD [facility] None  07/18/2017  2:00 PM [facility] D12 [facility] None  07/26/2017  1:15 PM [facility] LAB 6 [facility] None  07/26/2017  1:30 PM [facility] FLUSH NURSE [facility] None  07/26/2017  2:00 PM [facility] F18 [facility] None  07/28/2017  8:45 AM [facility] INJ NURSE [facility] None  07/30/2017  8:15 AM [facility] LAB 2 [facility] None  07/30/2017  8:30 AM [facility] FLUSH NURSE [facility] None  07/30/2017  9:00 AM Ladell Pier, MD [facility] None    07/30/2017 10:00 AM [facility] A1 [facility] None  08/01/2017  3:30 PM [facility] INJ NURSE [facility] None  11/01/2017 11:15 AM Denita Lung, MD [facility]    No orders of the defined types were placed in this encounter.      Subjective:   Patient ID:  Justin [name] is a 66 y.o. (DOB 1951/06/20) male.  Chief Complaint: No chief complaint on file.   HPI Justin [name] was seen in the infusion room for a suspected reaction to chemotherapy. He was receiving Taxol at the time of his reaction. He had received a total of 19-20 mL prior to onset of symptoms. His symptoms included: Hypertension, flushing, diaphoresis, and chest tightness. He was premedicated with Decadron prior to starting chemotherapy. Taxol was paused and JAQWON [name] was given Pepcid, Solu-Medrol, Benadryl, and an albuterol nebulizer after onset of his symptoms. Justin [name] did  respond to intervention.  The patient's symptoms resolved after 10-15 minutes. This case was discussed with Dr. Ladell Pier, who directed that the following be done: Dr. Benay Spice did not believe that the patient needed to be trans-referred to the emergency room given his EKG findings and response to his emergency medications.  Medications: I have reviewed the patient's current medications.  Allergies:  Allergies  Allergen Reactions  . Simvastatin     Cramping     Past Medical History:  Diagnosis Date  . Allergy   . Cataract   . Diabetes mellitus   . 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 Headache   . History of kidney stones   . Hx  of colonic polyps    pre-cancerous  . Hypercholesterolemia   . Hypertension     Past Surgical History:  Procedure Laterality Date  . COLONOSCOPY  [id number]  . ESOPHAGOGASTRODUODENOSCOPY (EGD) WITH PROPOFOL N/A 08/18/2016   Procedure: ESOPHAGOGASTRODUODENOSCOPY (EGD) WITH PROPOFOL;  Surgeon: Carol Ada, MD;  Location: [facility];  Service: Endoscopy;  Laterality: N/A;  . IR  GENERIC HISTORICAL  08/30/2016   IR US GUIDE VASC ACCESS RIGHT 08/30/2016 Corrie Mckusick, DO [facility]  . IR GENERIC HISTORICAL  08/30/2016   IR FLUORO GUIDE PORT INSERTION RIGHT 08/30/2016 Corrie Mckusick, DO [facility]    Family History  Problem Relation Age of Onset  . Stomach cancer Father   . Breast cancer Sister   . Liver cancer Brother     Social History   Socioeconomic History  . Marital status: Married    Spouse name: Not on file  . Number of children: Not on file  . Years of education: Not on file  . Highest education level: Not on file  Social Needs  . Financial resource strain: Not on file  . Food insecurity - worry: Not on file  . Food insecurity - inability: Not on file  . Transportation needs - medical: Not on file  . Transportation needs - non-medical: Not on file  Occupational History  . Not on file  Tobacco Use  . Smoking status: Current Every Day Smoker    Packs/day: 1.00    Years: 50.00    Pack years: 50.00    Types: Cigarettes  . Smokeless tobacco: Never Used  Substance and Sexual Activity  . Alcohol use: No  . Drug use: No  . Sexual activity: Yes  Other Topics Concern  . Not on file  Social History Narrative  . Not on file    Past Medical History, Surgical history, Social history, and Family history were reviewed and updated as appropriate.   Please see review of systems for further details on the patient's review from today.   Review of Systems:  Review of Systems  Constitutional: Positive for diaphoresis. Negative for fever.  HENT: Negative for facial swelling and trouble swallowing.   Respiratory: Positive for chest tightness. Negative for cough and shortness of breath.   Cardiovascular: Positive for chest pain and palpitations.  Musculoskeletal: Negative for back pain.    Objective:   Physical Exam:  There were no vitals taken for this visit.   Physical Exam  Constitutional: He appears distressed.  HENT:  Head: Normocephalic and  atraumatic.  Eyes: Right conjunctiva is injected. Left conjunctiva is injected.  Cardiovascular: Regular rhythm, S1 normal and S2 normal. Tachycardia present.  Pulmonary/Chest: Breath sounds normal. No respiratory distress. He has no wheezes. He has no rales.  Musculoskeletal: He exhibits no edema.  Neurological: He is alert.  Skin: He is diaphoretic. There is erythema (Face and neck).    Lab Review:     Component Value Date/Time   NA 135 (L) 07/11/2017 0941   K 3.5 07/11/2017 0941   CL 102 05/24/2017 2241   CO2 26 07/11/2017 0941   GLUCOSE 189 (H) 07/11/2017 0941   BUN 8.6 07/11/2017 0941   CREATININE 0.9 07/11/2017 0941   CALCIUM 9.7 07/11/2017 0941   PROT 8.0 07/11/2017 0941   ALBUMIN 3.8 07/11/2017 0941   AST 16 07/11/2017 0941   ALT 16 07/11/2017 0941   ALKPHOS 142 07/11/2017 0941   BILITOT 0.50 07/11/2017 0941   GFRNONAA >60 05/24/2017 2241   GFRAA >60  05/24/2017 2241       Component Value Date/Time   WBC 16.2 (H) 07/11/2017 0941   WBC 13.6 (H) 05/24/2017 2241   RBC 4.92 07/11/2017 0941   RBC 4.54 05/24/2017 2241   HGB 15.0 07/11/2017 0941   HCT 44.9 07/11/2017 0941   PLT 213 07/11/2017 0941   MCV 91.3 07/11/2017 0941   MCH 30.5 07/11/2017 0941   MCH 31.7 05/24/2017 2241   MCHC 33.4 07/11/2017 0941   MCHC 34.4 05/24/2017 2241   RDW 14.8 (H) 07/11/2017 0941   LYMPHSABS 2.3 07/11/2017 0941   MONOABS 1.1 (H) 07/11/2017 0941   EOSABS 0.2 07/11/2017 0941   BASOSABS 0.1 07/11/2017 0941   -------------------------------  Imaging from last 24 hours (if applicable):  Radiology interpretation: Ct Soft Tissue Neck W Contrast  Result Date: 06/29/2017 CLINICAL DATA:  Re- stage esophageal neoplasm. EXAM: CT NECK WITH CONTRAST TECHNIQUE: Multidetector CT imaging of the neck was performed using the standard protocol following the bolus administration of intravenous contrast. CONTRAST:  113mL ISOVUE-300 IOPAMIDOL (ISOVUE-300) INJECTION 61% COMPARISON:  03/16/2017  FINDINGS: Study done in conjunction with chest abdomen pelvis CT and there is early timing of the neck. This is not limit detection of adenopathy in this case. Pharynx and larynx: No noted mass or inflammation. 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 A right supraclavicular node measuring 18 mm by 8 mm previously 18 x 12 mm on axial slices. 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 Atherosclerotic calcification of the carotids. Limited intracranial: Negative Visualized orbits: Negative Mastoids and visualized paranasal sinuses: Clear other than a retention cyst on the floor of the left maxillary sinus. Skeleton: Cervical disc and facet degeneration. No acute or aggressive finding. Upper chest: Reported separately IMPRESSION: Regression of right supraclavicular nodes and neck edema which was likely related to DVT on the prior study. The supraclavicular nodes should be visible on future chest CT follow ups. No suspicious nodes in the jugular chains. Electronically Signed   By: Monte Fantasia M.D.   On: 06/29/2017 11:36   Ct Chest W Contrast  Result Date: 06/29/2017 CLINICAL DATA:  Esophageal cancer EXAM: CT CHEST, ABDOMEN, AND PELVIS WITH CONTRAST TECHNIQUE: Multidetector CT imaging of the chest, abdomen and pelvis was performed following the standard protocol during bolus administration of intravenous contrast. CONTRAST:  142mL ISOVUE-300 IOPAMIDOL (ISOVUE-300) INJECTION 61% COMPARISON:  03/16/2017 FINDINGS: CT CHEST FINDINGS Cardiovascular: Normal heart size. No pericardial effusion identified. Aortic atherosclerosis. Calcification in the LAD, left circumflex and RCA coronary artery is noted. Mediastinum/Nodes: The trachea is  patent and midline. Unremarkable appearance of the esophagus. Index right paratracheal node measures 11 mm, image 29 of series 2. Previously 10 mm. Lungs/Pleura: No pleural effusion identified. Mild changes of emphysema. Right upper lobe pulmonary nodule measures 5 mm, image 66 of series 10. Unchanged from previous exam. Calcified granuloma noted within the periphery of the right upper lobe. Stable 5 mm subpleural nodule in the posterior left upper lobe, image 57 of series 10. Lower lobe predominant increase interstitial markings noted bilaterally including interstitial reticulation. Musculoskeletal: No aggressive lytic or sclerotic bone lesions. CT ABDOMEN PELVIS FINDINGS Hepatobiliary: There has been interval development of multifocal low-attenuation lesions in the liver compatible with metastatic disease. Index lesion within segment 8 measures 2.9 cm, image  59 of series 3. Segment 7 lesion measures 2.9 cm, image 59 of series 3. Posterior right lobe of liver lesion measures 2.5 cm, image 62 of series 3. Gallbladder normal. No biliary dilatation. Pancreas: Normal appearance of the pancreas. Spleen: Normal in size without focal abnormality. Adrenals/Urinary Tract: The adrenal glands are normal. The kidneys are unremarkable. No mass or hydronephrosis. Urinary bladder is unremarkable. Stomach/Bowel: Stomach is within normal limits. Appendix appears normal. No evidence of bowel wall thickening, distention, or inflammatory changes. Vascular/Lymphatic: Aortic atherosclerosis. No aneurysm. Portacaval lymph node measures 1.7 cm, image 72 of series 3. Previously 1.3 cm. No pelvic or inguinal adenopathy. Reproductive: Prostate gland is enlarged measuring 5.1 by 4.9 by 7.3 cm (volume = 96 cm^3). Other: No abdominal wall hernia or abnormality. No abdominopelvic ascites. Musculoskeletal: No acute or significant osseous findings. IMPRESSION: 1. Interval progression of disease. Interval development of multifocal liver metastasis.  Portacaval node within the upper abdomen is increased in size in the interval. 2. Stable small nonspecific pulmonary nodules. 3. Aortic Atherosclerosis (ICD10-I70.0) and Emphysema (ICD10-J43.9). Electronically Signed   By: Kerby Moors M.D.   On: 06/29/2017 14:03   Ct Abdomen Pelvis W Contrast  Result Date: 06/29/2017 CLINICAL DATA:  Esophageal cancer EXAM: CT CHEST, ABDOMEN, AND PELVIS WITH CONTRAST TECHNIQUE: Multidetector CT imaging of the chest, abdomen and pelvis was performed following the standard protocol during bolus administration of intravenous contrast. CONTRAST:  123mL ISOVUE-300 IOPAMIDOL (ISOVUE-300) INJECTION 61% COMPARISON:  03/16/2017 FINDINGS: CT CHEST FINDINGS Cardiovascular: Normal heart size. No pericardial effusion identified. Aortic atherosclerosis. Calcification in the LAD, left circumflex and RCA coronary artery is noted. Mediastinum/Nodes: The trachea is patent and midline. Unremarkable appearance of the esophagus. Index right paratracheal node measures 11 mm, image 29 of series 2. Previously 10 mm. Lungs/Pleura: No pleural effusion identified. Mild changes of emphysema. Right upper lobe pulmonary nodule measures 5 mm, image 66 of series 10. Unchanged from previous exam. Calcified granuloma noted within the periphery of the right upper lobe. Stable 5 mm subpleural nodule in the posterior left upper lobe, image 57 of series 10. Lower lobe predominant increase interstitial markings noted bilaterally including interstitial reticulation. Musculoskeletal: No aggressive lytic or sclerotic bone lesions. CT ABDOMEN PELVIS FINDINGS Hepatobiliary: There has been interval development of multifocal low-attenuation lesions in the liver compatible with metastatic disease. Index lesion within segment 8 measures 2.9 cm, image 59 of series 3. Segment 7 lesion measures 2.9 cm, image 59 of series 3. Posterior right lobe of liver lesion measures 2.5 cm, image 62 of series 3. Gallbladder normal. No  biliary dilatation. Pancreas: Normal appearance of the pancreas. Spleen: Normal in size without focal abnormality. Adrenals/Urinary Tract: The adrenal glands are normal. The kidneys are unremarkable. No mass or hydronephrosis. Urinary bladder is unremarkable. Stomach/Bowel: Stomach is within normal limits. Appendix appears normal. No evidence of bowel wall thickening, distention, or inflammatory changes. Vascular/Lymphatic: Aortic atherosclerosis. No aneurysm. Portacaval lymph node measures 1.7 cm, image 72 of series 3. Previously 1.3 cm. No pelvic or inguinal adenopathy. Reproductive: Prostate gland is enlarged measuring 5.1 by 4.9 by 7.3 cm (volume = 96 cm^3). Other: No abdominal wall hernia or abnormality. No abdominopelvic ascites. Musculoskeletal: No acute or significant osseous findings. IMPRESSION: 1. Interval progression of disease. Interval development of multifocal liver metastasis. Portacaval node within the upper abdomen is increased in size in the interval. 2. Stable small nonspecific pulmonary nodules. 3. Aortic Atherosclerosis (ICD10-I70.0) and Emphysema (ICD10-J43.9). Electronically Signed   By: Kerby Moors M.D.   On:  06/29/2017 14:03        This patient was seen with Dr. Benay Spice with my treatment plan reviewed with him. He expressed agreement with my medical management of this patient. This was a shared visit with Sandi Mealy.  Mr. Lacock was seen in the chemotherapy room and examined.  He developed an acute allergic reaction during the Taxol infusion.  His symptoms improved with medical therapy and he was discharged to home.  He will not be rechallenged with Taxol.  We will consider switching to Abraxane.  Mr. Scheff will return as scheduled 07/18/2017.  Julieanne Manson, MD

## 2017-07-14 ENCOUNTER — Encounter (HOSPITAL_COMMUNITY): Payer: Self-pay | Admitting: *Deleted

## 2017-07-14 ENCOUNTER — Emergency Department (HOSPITAL_COMMUNITY): Payer: Medicare Other

## 2017-07-14 ENCOUNTER — Emergency Department (HOSPITAL_COMMUNITY)
Admission: EM | Admit: 2017-07-14 | Discharge: 2017-07-14 | Disposition: A | Discharge status: Home / Self Care | Payer: Medicare Other | Attending: Emergency Medicine | Admitting: Emergency Medicine

## 2017-07-14 DIAGNOSIS — R1011 Right upper quadrant pain: Secondary | ICD-10-CM | POA: Diagnosis not present

## 2017-07-14 DIAGNOSIS — R109 Unspecified abdominal pain: Secondary | ICD-10-CM | POA: Diagnosis not present

## 2017-07-14 DIAGNOSIS — Z7984 Long term (current) use of oral hypoglycemic drugs: Secondary | ICD-10-CM | POA: Insufficient documentation

## 2017-07-14 DIAGNOSIS — C159 Malignant neoplasm of esophagus, unspecified: Secondary | ICD-10-CM | POA: Insufficient documentation

## 2017-07-14 DIAGNOSIS — R1110 Vomiting, unspecified: Secondary | ICD-10-CM | POA: Diagnosis not present

## 2017-07-14 DIAGNOSIS — R197 Diarrhea, unspecified: Secondary | ICD-10-CM | POA: Diagnosis not present

## 2017-07-14 DIAGNOSIS — F17210 Nicotine dependence, cigarettes, uncomplicated: Secondary | ICD-10-CM | POA: Insufficient documentation

## 2017-07-14 DIAGNOSIS — E119 Type 2 diabetes mellitus without complications: Secondary | ICD-10-CM | POA: Diagnosis not present

## 2017-07-14 DIAGNOSIS — R918 Other nonspecific abnormal finding of lung field: Secondary | ICD-10-CM | POA: Diagnosis not present

## 2017-07-14 DIAGNOSIS — Z79899 Other long term (current) drug therapy: Secondary | ICD-10-CM | POA: Insufficient documentation

## 2017-07-14 DIAGNOSIS — I10 Essential (primary) hypertension: Secondary | ICD-10-CM | POA: Diagnosis not present

## 2017-07-14 LAB — CBC WITH DIFFERENTIAL/PLATELET
BASOS PCT: 0 %
Basophils Absolute: 0 10*3/uL (ref 0.0–0.1)
EOS PCT: 1 %
Eosinophils Absolute: 0.3 10*3/uL (ref 0.0–0.7)
HCT: 42.8 % (ref 39.0–52.0)
Hemoglobin: 14.8 g/dL (ref 13.0–17.0)
LYMPHS ABS: 3.7 10*3/uL (ref 0.7–4.0)
Lymphocytes Relative: 14 %
MCH: 31.4 pg (ref 26.0–34.0)
MCHC: 34.6 g/dL (ref 30.0–36.0)
MCV: 90.7 fL (ref 78.0–100.0)
MONO ABS: 1.6 10*3/uL — AB (ref 0.1–1.0)
Monocytes Relative: 6 %
NEUTROS ABS: 20.8 10*3/uL — AB (ref 1.7–7.7)
Neutrophils Relative %: 79 %
PLATELETS: 194 10*3/uL (ref 150–400)
RBC: 4.72 MIL/uL (ref 4.22–5.81)
RDW: 14.2 % (ref 11.5–15.5)
WBC: 26.4 10*3/uL — ABNORMAL HIGH (ref 4.0–10.5)

## 2017-07-14 LAB — COMPREHENSIVE METABOLIC PANEL
ALK PHOS: 106 U/L (ref 38–126)
ALT: 22 U/L (ref 17–63)
ANION GAP: 8 (ref 5–15)
AST: 31 U/L (ref 15–41)
Albumin: 3.7 g/dL (ref 3.5–5.0)
BUN: 18 mg/dL (ref 6–20)
CALCIUM: 8.9 mg/dL (ref 8.9–10.3)
CHLORIDE: 103 mmol/L (ref 101–111)
CO2: 23 mmol/L (ref 22–32)
Creatinine, Ser: 0.68 mg/dL (ref 0.61–1.24)
GFR calc non Af Amer: >60 mL/min (ref >60)
Glucose, Bld: 207 mg/dL — ABNORMAL HIGH (ref 65–99)
POTASSIUM: 3.9 mmol/L (ref 3.5–5.1)
SODIUM: 134 mmol/L — AB (ref 135–145)
Total Bilirubin: 0.5 mg/dL (ref 0.3–1.2)
Total Protein: 7.4 g/dL (ref 6.5–8.1)

## 2017-07-14 LAB — PROTIME-INR
INR: 1.92
Prothrombin Time: 21.8 seconds — ABNORMAL HIGH (ref 11.4–15.2)

## 2017-07-14 LAB — URINALYSIS, ROUTINE W REFLEX MICROSCOPIC
Bilirubin Urine: NEGATIVE
Glucose, UA: 50 mg/dL — AB
HGB URINE DIPSTICK: NEGATIVE
Ketones, ur: NEGATIVE mg/dL
Leukocytes, UA: NEGATIVE
NITRITE: NEGATIVE
PROTEIN: NEGATIVE mg/dL
Specific Gravity, Urine: >1.046 — ABNORMAL HIGH (ref 1.005–1.030)
pH: 6 (ref 5.0–8.0)

## 2017-07-14 MED ORDER — MORPHINE SULFATE (PF) 4 MG/ML IV SOLN
4.0000 mg | Freq: Once | INTRAVENOUS | Status: AC
  Administered 2017-07-14: 4 mg via INTRAVENOUS
  Filled 2017-07-14: qty 1

## 2017-07-14 MED ORDER — IOPAMIDOL (ISOVUE-300) INJECTION 61%
INTRAVENOUS | Status: AC
  Filled 2017-07-14: qty 100

## 2017-07-14 MED ORDER — MORPHINE SULFATE (PF) 4 MG/ML IV SOLN
4.0000 mg | INTRAVENOUS | Status: DC | PRN
  Administered 2017-07-14: 4 mg via INTRAVENOUS
  Filled 2017-07-14: qty 1

## 2017-07-14 MED ORDER — ONDANSETRON HCL 4 MG/2ML IJ SOLN
4.0000 mg | Freq: Once | INTRAMUSCULAR | Status: AC
  Administered 2017-07-14: 4 mg via INTRAVENOUS
  Filled 2017-07-14: qty 2

## 2017-07-14 MED ORDER — HYDROCODONE-ACETAMINOPHEN 5-325 MG PO TABS: 1.0000 | ORAL_TABLET | ORAL | 0 refills | Status: DC | PRN

## 2017-07-14 MED ORDER — IOPAMIDOL (ISOVUE-300) INJECTION 61%
100.0000 mL | Freq: Once | INTRAVENOUS | Status: AC | PRN
  Administered 2017-07-14: 100 mL via INTRAVENOUS

## 2017-07-14 NOTE — Discharge Instructions (Signed)
Call your oncologist this week if failing to improve.  Return to ER with worsening symptoms

## 2017-07-14 NOTE — ED Provider Notes (Signed)
[facility] DEPT Provider Note   CSN: [id number] Arrival date & time: 07/14/17  0757     History   Chief Complaint Chief Complaint  Patient presents with  . Abdominal Pain    HPI Justin [name] is a 66 y.o. male. Chief complaint is right upper quadrant pain.  HPI : 66 year old male. History of esophageal cancer with known liver metastases. He follows with Dr. Learta Codding. Had liver metastases upon diagnosis. Underwent full fox treatment and had resolution of his liver metastases to the point of being nonvisualized on CT. Was placed on 5-FU maintenance. Had recurrence of liver metastases and underwent initiation of Taxol and Ramucirumab salvage on December 11.  Folfox 08/2016 Through 03/2017 5-FU and Leucovorin 03/2017 Through 06/2017 12/11: Taxol and Ramucirumab salvage Ctxx.  Develop right upper quadrant pain last night. Slow in onset but progressive over 10-15 minutes. No pleuritic discomfort. No shortness of breath. He has a chronic "smoker's cough". No change in nature of his cough. No change in sputum. No hemoptysis. 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  Nausea. No vomiting. No change in bowel or bladder habits. No dark urine or light stools.  No fever.  Past Medical History:  Diagnosis Date  . Allergy   . Cataract   . Diabetes mellitus   . 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 Headache   . History of kidney stones   . Hx of colonic polyps    pre-cancerous  . Hypercholesterolemia   . Hypertension     Patient Active Problem List   Diagnosis Date Noted  . Deep vein thrombosis (DVT) ([place]) 03/16/2017  . Neuropathy due to chemotherapeutic drug ([place]) 02/26/2017  . Port catheter in place 09/18/2016  . Cancer of distal third of esophagus ([place]) 08/25/2016  . Goals of care, counseling/discussion 08/25/2016  . 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  History of renal stone 01/28/2016  . RLS (restless legs syndrome) 09/30/2015  . Arthritis 12/20/2012  . Current smoker 12/20/2012  . Type 2 diabetes with complication ([place]) 61/44/3154  . Hypertension associated with diabetes ([place]) 12/02/2010  . Hyperlipidemia associated with type 2 diabetes mellitus ([place]) 12/02/2010  . Allergic rhinitis 12/02/2010  . Diverticulosis of colon 12/02/2010  . BPH (benign prostatic hyperplasia) 12/02/2010  . Hx of colonic polyps 12/02/2010    Past Surgical History:  Procedure Laterality Date  . COLONOSCOPY  [id number]  . ESOPHAGOGASTRODUODENOSCOPY (EGD) WITH PROPOFOL N/A 08/18/2016   Procedure: ESOPHAGOGASTRODUODENOSCOPY (EGD) WITH PROPOFOL;  Surgeon: Carol Ada, MD;  Location: [facility];  Service: Endoscopy;  Laterality: N/A;  . IR GENERIC HISTORICAL  08/30/2016   IR US GUIDE VASC ACCESS RIGHT 08/30/2016 Corrie Mckusick, DO [facility]  . IR GENERIC HISTORICAL  08/30/2016   IR FLUORO GUIDE PORT INSERTION RIGHT 08/30/2016 Corrie Mckusick, DO [facility]       Home Medications    Prior to Admission medications   Medication Sig Start Date End Date Taking? Authorizing Provider  acetaminophen (TYLENOL) 500 MG tablet Take 1,000 mg by mouth every 6 (six) hours as needed for mild pain.   Yes [provider]  blood glucose meter kit and supplies KIT Dispense patient/insurance preference. Use up to four times daily as directed. 07/10/17  Yes Denita Lung, MD  dexamethasone (DECADRON) 2 MG tablet Take 5 tabs (30m) PO at 10PM the night before first chemo. Repeat 156mat 6AM  day of first chemo. 07/05/17  Yes Ladell Pier, MD  finasteride (PROSCAR) 5 MG tablet Take 1 tablet (5 mg total) by mouth daily. 01/28/16  Yes Denita Lung, MD  glucose blood test strip PT IS TO TEST ONE TO TWO TIMES A DAY 01/28/16  Yes Denita Lung, MD  JANUVIA 100 MG tablet TAKE 1 TABLET DAILY Patient taking differently: take 121m by mouth daily 08/07/16  Yes LDenita Lung MD    Lancets (FREESTYLE) lancets PT IS TO TEST ONE TO TWO TIMES A DAY 01/28/16  Yes LDenita Lung MD  lidocaine-prilocaine (EMLA) cream Apply 1 application topically as needed. Apply to port site and cover with plastic wrap one hour before port access 03/06/17  Yes TOwens Shark NP  lisinopril-hydrochlorothiazide (PRINZIDE,ZESTORETIC) 10-12.5 MG tablet Take 1 tablet by mouth daily. 07/10/17  Yes LDenita Lung MD  rivaroxaban (XARELTO) 20 MG TABS tablet Take 1 tablet (20 mg total) by mouth daily with supper. Patient taking differently: Take 20 mg by mouth daily.  04/10/17  Yes TOwens Shark NP  HYDROcodone-acetaminophen (NORCO/VICODIN) 5-325 MG tablet Take 1 tablet by mouth every 4 (four) hours as needed. 07/14/17   JTanna Furry MD  simvastatin (ZOCOR) 10 MG tablet Take 1 tablet (10 mg total) by mouth at bedtime. Patient not taking: Reported on 07/10/2017 01/28/16   LDenita Lung MD    Family History Family History  Problem Relation Age of Onset  . Stomach cancer Father   . Breast cancer Sister   . Liver cancer Brother     Social History Social History   Tobacco Use  . Smoking status: Current Every Day Smoker    Packs/day: 1.00    Years: 50.00    Pack years: 50.00    Types: Cigarettes  . Smokeless tobacco: Never Used  Substance Use Topics  . Alcohol use: No  . Drug use: No     Allergies   Simvastatin and Taxol [paclitaxel]   Review of Systems Review of Systems  Constitutional: Negative for appetite change, chills, diaphoresis, fatigue and fever.  HENT: Negative for mouth sores, sore throat and trouble swallowing.   Eyes: Negative for visual disturbance.  Respiratory: Negative for cough, chest tightness, shortness of breath and wheezing.   Cardiovascular: Negative for chest pain.  Gastrointestinal: Positive for abdominal pain and nausea. Negative for abdominal distention, diarrhea and vomiting.  Endocrine: Negative for polydipsia, polyphagia and polyuria.   Genitourinary: Negative for dysuria, frequency and hematuria.  Musculoskeletal: Negative for gait problem.  Skin: Negative for color change, pallor and rash.  Neurological: Negative for dizziness, syncope, light-headedness and headaches.  Hematological: Does not bruise/bleed easily.  Psychiatric/Behavioral: Negative for behavioral problems and confusion.     Physical Exam Updated Vital Signs BP 129/76   Pulse 81   Temp 98.1 F (36.7 C) (Oral)   Resp 18   SpO2 94%   Physical Exam  Constitutional: He is oriented to person, place, and time. He appears well-developed and well-nourished. No distress.  Awake and alert. Does not appear distressed. He is quite stoic. He rates his pain 8/10.  HENT:  Head: Normocephalic.  Eyes: Conjunctivae are normal. Pupils are equal, round, and reactive to light. No scleral icterus.  Conjunctiva not pale. No scleral icterus.  Neck: Normal range of motion. Neck supple. No thyromegaly present.  Cardiovascular: Normal rate and regular rhythm. Exam reveals no gallop and no friction rub.  No murmur heard. Pulmonary/Chest: Effort normal and breath sounds  normal. No respiratory distress. He has no wheezes. He has no rales.  Clear lungs. No adventitial breath sounds. No increased worker breathing.  Abdominal: Soft. Bowel sounds are normal. He exhibits no distension. There is no tenderness. There is no rebound.  Tenderness in the right upper quadrant. No palpable mass.Right lower quadrant, left upper quadrant, or epigastric pain or tenderness.  Musculoskeletal: Normal range of motion.  Neurological: He is alert and oriented to person, place, and time.  Skin: Skin is warm and dry. No rash noted.  Psychiatric: He has a normal mood and affect. His behavior is normal.     ED Treatments / Results  Labs (all labs ordered are listed, but only abnormal results are displayed) Labs Reviewed  CBC WITH DIFFERENTIAL/PLATELET - Abnormal; Notable for the following  components:      Result Value   WBC 26.4 (*)    Neutro Abs 20.8 (*)    Monocytes Absolute 1.6 (*)    All other components within normal limits  COMPREHENSIVE METABOLIC PANEL - Abnormal; Notable for the following components:   Sodium 134 (*)    Glucose, Bld 207 (*)    All other components within normal limits  PROTIME-INR - Abnormal; Notable for the following components:   Prothrombin Time 21.8 (*)    All other components within normal limits  URINALYSIS, ROUTINE W REFLEX MICROSCOPIC - Abnormal; Notable for the following components:   Specific Gravity, Urine >1.046 (*)    Glucose, UA 50 (*)    All other components within normal limits    EKG  EKG Interpretation None       Radiology Dg Chest 2 View  Result Date: 07/14/2017 CLINICAL DATA:  Right upper quadrant pain.  Chemotherapy 3 days ago. EXAM: CHEST  2 VIEW COMPARISON:  CT chest 03/16/2017 FINDINGS: The lungs are hyperinflated likely secondary to COPD. There is bilateral chronic interstitial thickening at the lung bases. There is no focal parenchymal opacity. There is no pleural effusion or pneumothorax. The heart and mediastinal contours are unremarkable. The osseous structures are unremarkable. IMPRESSION: No active cardiopulmonary disease. Electronically Signed   By: Kathreen Devoid   On: 07/14/2017 09:41   Ct Abdomen Pelvis W Contrast  Result Date: 07/14/2017 CLINICAL DATA:  Pt complains of constant right sided abdominal pain since last night. Pt has hx of liver cancer. Pt states he had some nausea, no vomiting or diarrhea. EXAM: CT ABDOMEN AND PELVIS WITH CONTRAST TECHNIQUE: Multidetector CT imaging of the abdomen and pelvis was performed using the standard protocol following bolus administration of intravenous contrast. CONTRAST:  158m ISOVUE-300 IOPAMIDOL (ISOVUE-300) INJECTION 61% COMPARISON:  06/29/2017 FINDINGS: Lower chest: No acute findings. Chronic interstitial thickening superimposed mild subsegmental atelectasis at  the lung bases. 5 mm nodule at the base of the right lower lobe, stable. Heart normal size. Hepatobiliary: Multiple low-density liver lesions are noted. Several reference measurements were made. Lesion anteriorly in segment 2 measures 3.3 cm, previously 2.9 cm. Adjacent lesion in segment 4A, 3.5 cm, previously 2.9 cm. Lesion posteriorly in segment 7, 2.8 cm, previously 2.5 cm. Lesion inferiorly, segment 6, 3.2 cm, previously 2.6 cm. There are no new lesions. Gallbladder is unremarkable.  No bile duct dilation. Pancreas: Unremarkable. No pancreatic ductal dilatation or surrounding inflammatory changes. Spleen: Normal in size without focal abnormality. Adrenals/Urinary Tract: Adrenal glands are unremarkable. Kidneys are normal, without renal calculi, focal lesion, or hydronephrosis. Bladder is unremarkable. Stomach/Bowel: Stomach and small bowel unremarkable. No colonic dilation, wall thickening or inflammation. There  multiple sigmoid colon diverticula. No diverticulitis. Normal appendix visualized. Vascular/Lymphatic: There are several shotty subcentimeter periceliac lymph nodes similar to the prior exam. Mildly enlarged gastrohepatic ligament lymph node measuring 12 mm in short axis also similar to the prior study. No new enlarged lymph nodes. There are is atherosclerotic disease throughout the abdominal aorta extending to the branch vessels. Mild distention of the infrarenal abdominal aorta to 2.3 cm. No aneurysm. Reproductive: Prostate gland is enlarged, bulging against the bladder base. It measures 5.9 x 4.6 x 4.9 cm. Other: No abdominal wall hernia.  No ascites. Musculoskeletal: No fracture or acute finding. No osteoblastic or osteolytic lesions. IMPRESSION: 1. No acute findings within the abdomen or pelvis. 2. Multiple liver lesions consistent with metastatic disease have mildly progressed from the prior CT, with lesions increasing in size, but no new lesions. 3. Prominent to mildly enlarged. Periceliac and  gastrohepatic ligament lymph nodes, stable from the prior CT. These may be reactive or reflect metastatic disease. 4. Sigmoid colon diverticula.  No diverticulitis. 5. Prostatic hypertrophy. 6. Aortic atherosclerosis. Electronically Signed   By: Lajean Manes M.D.   On: 07/14/2017 10:08    Procedures Procedures (including critical care time)  Medications Ordered in ED Medications  morphine 4 MG/ML injection 4 mg (4 mg Intravenous Given 07/14/17 0854)  iopamidol (ISOVUE-300) 61 % injection (not administered)  ondansetron (ZOFRAN) injection 4 mg (4 mg Intravenous Given 07/14/17 0854)  iopamidol (ISOVUE-300) 61 % injection 100 mL (100 mLs Intravenous Contrast Given 07/14/17 0947)  ondansetron (ZOFRAN) injection 4 mg (4 mg Intravenous Given 07/14/17 1225)  morphine 4 MG/ML injection 4 mg (4 mg Intravenous Given 07/14/17 1226)     Initial Impression / Assessment and Plan / ED Course  I have reviewed the triage vital signs and the nursing notes.  Pertinent labs & imaging results that were available during my care of the patient were reviewed by me and considered in my medical decision making (see chart for details).   rectal quadrant pain and esophageal cancer patient with known metastases. Not jaundiced. Plan CT abdomen. X-ray of the chest. Doubt PE as this is not pleuritic. His pain is more abdominal and he is anticoagulated. Await labs and imaging including chest x-ray and CT abdomen. Pain control with IV meds. Reevaluation.  Final Clinical Impressions(s) / ED Diagnoses   Final diagnoses:  Right upper quadrant abdominal pain    Discussed with Dr. Simeon Craft such on-call for patient's oncologist. He has increase in size of metastatic lesions but no sign of liver dysfunction or obstruction. No other additional new findings. Not hypoxemic. Doubt PE, patient is anticoagulated. Normal chest x-ray. Leukocytosis likely from high-dose steroids given her recent therapeutics. Plans discharge home. Rate  aware of leukocytosis. A fever worsening symptoms recheck here. Prescription for Vicodin.  ED Discharge Orders        Ordered    HYDROcodone-acetaminophen (NORCO/VICODIN) 5-325 MG tablet  Every 4 hours PRN     07/14/17 1305       Tanna Furry, MD 07/14/17 1455

## 2017-07-14 NOTE — ED Notes (Signed)
U/A per MD order. Urinal at bedside. Pt advised to provide specimen when possible. Pt acknowledged/confirmed understanding. [employer]

## 2017-07-14 NOTE — ED Triage Notes (Signed)
Pt complains of constant right sided abdominal pain since last night. Pt has hx of liver cancer. Pt states he had some nausea, no vomiting or diarrhea. Pain is 8/10.

## 2017-07-14 NOTE — ED Notes (Signed)
ED Provider at bedside. 

## 2017-07-18 ENCOUNTER — Ambulatory Visit (HOSPITAL_BASED_OUTPATIENT_CLINIC_OR_DEPARTMENT_OTHER): Payer: Medicare Other

## 2017-07-18 ENCOUNTER — Telehealth: Payer: Self-pay | Admitting: Oncology

## 2017-07-18 ENCOUNTER — Ambulatory Visit: Payer: Medicare Other

## 2017-07-18 ENCOUNTER — Other Ambulatory Visit (HOSPITAL_BASED_OUTPATIENT_CLINIC_OR_DEPARTMENT_OTHER): Payer: Medicare Other

## 2017-07-18 ENCOUNTER — Ambulatory Visit (HOSPITAL_BASED_OUTPATIENT_CLINIC_OR_DEPARTMENT_OTHER): Payer: Medicare Other | Admitting: Oncology

## 2017-07-18 VITALS — BP 148/84 | HR 91 | Temp 98.2°F | Resp 18 | Ht 70.0 in | Wt 197.6 lb

## 2017-07-18 DIAGNOSIS — C155 Malignant neoplasm of lower third of esophagus: Secondary | ICD-10-CM

## 2017-07-18 DIAGNOSIS — I82621 Acute embolism and thrombosis of deep veins of right upper extremity: Secondary | ICD-10-CM

## 2017-07-18 DIAGNOSIS — Z95828 Presence of other vascular implants and grafts: Secondary | ICD-10-CM

## 2017-07-18 DIAGNOSIS — Z5189 Encounter for other specified aftercare: Secondary | ICD-10-CM

## 2017-07-18 DIAGNOSIS — Z5111 Encounter for antineoplastic chemotherapy: Secondary | ICD-10-CM | POA: Diagnosis not present

## 2017-07-18 LAB — CBC WITH DIFFERENTIAL/PLATELET
BASO%: 0.2 % (ref 0.0–2.0)
Basophils Absolute: 0 10*3/uL (ref 0.0–0.1)
EOS%: 1 % (ref 0.0–7.0)
Eosinophils Absolute: 0.2 10*3/uL (ref 0.0–0.5)
HEMATOCRIT: 42 % (ref 38.4–49.9)
HGB: 14.2 g/dL (ref 13.0–17.1)
LYMPH#: 2 10*3/uL (ref 0.9–3.3)
LYMPH%: 11.1 % — ABNORMAL LOW (ref 14.0–49.0)
MCH: 30.7 pg (ref 27.2–33.4)
MCHC: 33.8 g/dL (ref 32.0–36.0)
MCV: 90.7 fL (ref 79.3–98.0)
MONO#: 1.9 10*3/uL — ABNORMAL HIGH (ref 0.1–0.9)
MONO%: 11 % (ref 0.0–14.0)
NEUT%: 76.7 % — AB (ref 39.0–75.0)
NEUTROS ABS: 13.5 10*3/uL — AB (ref 1.5–6.5)
Platelets: 152 10*3/uL (ref 140–400)
RBC: 4.63 10*6/uL (ref 4.20–5.82)
RDW: 13.9 % (ref 11.0–14.6)
WBC: 17.6 10*3/uL — AB (ref 4.0–10.3)

## 2017-07-18 MED ORDER — PACLITAXEL PROTEIN-BOUND CHEMO INJECTION 100 MG
200.0000 mg | Freq: Once | INTRAVENOUS | Status: AC
  Administered 2017-07-18: 200 mg via INTRAVENOUS
  Filled 2017-07-18: qty 40

## 2017-07-18 MED ORDER — FAMOTIDINE IN NACL 20-0.9 MG/50ML-% IV SOLN
INTRAVENOUS | Status: AC
  Filled 2017-07-18: qty 50

## 2017-07-18 MED ORDER — HEPARIN SOD (PORK) LOCK FLUSH 100 UNIT/ML IV SOLN
500.0000 [IU] | Freq: Once | INTRAVENOUS | Status: AC | PRN
  Administered 2017-07-18: 500 [IU]
  Filled 2017-07-18: qty 5

## 2017-07-18 MED ORDER — FAMOTIDINE IN NACL 20-0.9 MG/50ML-% IV SOLN
20.0000 mg | Freq: Once | INTRAVENOUS | Status: AC
  Administered 2017-07-18: 20 mg via INTRAVENOUS

## 2017-07-18 MED ORDER — DIPHENHYDRAMINE HCL 50 MG/ML IJ SOLN
INTRAMUSCULAR | Status: AC
  Filled 2017-07-18: qty 1

## 2017-07-18 MED ORDER — SODIUM CHLORIDE 0.9% FLUSH
10.0000 mL | INTRAVENOUS | Status: DC | PRN
  Administered 2017-07-18: 10 mL
  Filled 2017-07-18: qty 10

## 2017-07-18 MED ORDER — DIPHENHYDRAMINE HCL 50 MG/ML IJ SOLN
25.0000 mg | Freq: Once | INTRAMUSCULAR | Status: AC
  Administered 2017-07-18: 25 mg via INTRAVENOUS

## 2017-07-18 MED ORDER — ALTEPLASE 2 MG IJ SOLR
2.0000 mg | Freq: Once | INTRAMUSCULAR | Status: AC | PRN
  Administered 2017-07-18: 2 mg
  Filled 2017-07-18: qty 2

## 2017-07-18 MED ORDER — DEXAMETHASONE SODIUM PHOSPHATE 10 MG/ML IJ SOLN
10.0000 mg | Freq: Once | INTRAMUSCULAR | Status: AC
  Administered 2017-07-18: 10 mg via INTRAVENOUS

## 2017-07-18 MED ORDER — PROCHLORPERAZINE MALEATE 10 MG PO TABS
ORAL_TABLET | ORAL | Status: AC
  Filled 2017-07-18: qty 1

## 2017-07-18 MED ORDER — SODIUM CHLORIDE 0.9 % IV SOLN
Freq: Once | INTRAVENOUS | Status: AC
  Administered 2017-07-18: 16:00:00 via INTRAVENOUS

## 2017-07-18 MED ORDER — ALTEPLASE 2 MG IJ SOLR
INTRAMUSCULAR | Status: AC
  Filled 2017-07-18: qty 2

## 2017-07-18 MED ORDER — DEXAMETHASONE SODIUM PHOSPHATE 10 MG/ML IJ SOLN
INTRAMUSCULAR | Status: AC
  Filled 2017-07-18: qty 1

## 2017-07-18 MED ORDER — SODIUM CHLORIDE 0.9% FLUSH
10.0000 mL | INTRAVENOUS | Status: DC | PRN
  Filled 2017-07-18: qty 10

## 2017-07-18 MED ORDER — PROCHLORPERAZINE MALEATE 10 MG PO TABS
10.0000 mg | ORAL_TABLET | Freq: Once | ORAL | Status: AC
  Administered 2017-07-18: 10 mg via ORAL

## 2017-07-18 NOTE — Progress Notes (Signed)
CBC Cmet (12/20) reviewed by Dr. Benay Spice.  Ok to treat with these lab results.

## 2017-07-18 NOTE — Telephone Encounter (Signed)
Gave avs and calendar for January  °

## 2017-07-18 NOTE — Progress Notes (Signed)
[facility] OFFICE PROGRESS NOTE   Diagnosis: Esophagus cancer  INTERVAL HISTORY:   Mr. Jarnigan returns as scheduled.  He was treated with a first cycle of Taxol/ramucirumab 07/12/2017.  He developed chest pain and diaphoresis a few minutes into the Taxol infusion.  The Taxol was discontinued and he received Pepcid, Solu-Medrol, Benadryl, and nebulizer treatment.  His symptoms resolved over several minutes.  He was not rechallenged with Taxol. Mr. Kondracki was seen in the emergency room 07/14/2017 with pain in the right upper abdomen.  A CT abdomen revealed mild progression of liver metastases compared to a CT 06/29/2017.  No new lesions.  A chest x-ray revealed no active disease. The right upper quadrant pain has improved.  He has a poor appetite.  Objective:  Vital signs in last 24 hours:  Blood pressure (!) 148/84, pulse 91, temperature 98.2 F (36.8 C), temperature source Oral, resp. rate 18, height '5\' 10"'$  (1.778 m), weight 197 lb 9.6 oz (89.6 kg), SpO2 98 %.    HEENT: No thrush or ulcers Resp: Scattered inspiratory/expiratory rhonchi at the posterior chest, no respiratory distress Cardio: Regular rate and rhythm GI: No hepatomegaly, nontender Vascular: No leg edema    Portacath/PICC-without erythema  Lab Results:  Lab Results  Component Value Date   WBC 17.6 (H) 07/18/2017   HGB 14.2 07/18/2017   HCT 42.0 07/18/2017   MCV 90.7 07/18/2017   PLT 152 07/18/2017   NEUTROABS 13.5 (H) 07/18/2017    CMP     Component Value Date/Time   NA 134 (L) 07/14/2017 0856   NA 135 (L) 07/11/2017 0941   K 3.9 07/14/2017 0856   K 3.5 07/11/2017 0941   CL 103 07/14/2017 0856   CO2 23 07/14/2017 0856   CO2 26 07/11/2017 0941   GLUCOSE 207 (H) 07/14/2017 0856   GLUCOSE 189 (H) 07/11/2017 0941   BUN 18 07/14/2017 0856   BUN 8.6 07/11/2017 0941   CREATININE 0.68 07/14/2017 0856   CREATININE 0.9 07/11/2017 0941   CALCIUM 8.9 07/14/2017 0856   CALCIUM 9.7 07/11/2017 0941    PROT 7.4 07/14/2017 0856   PROT 8.0 07/11/2017 0941   ALBUMIN 3.7 07/14/2017 0856   ALBUMIN 3.8 07/11/2017 0941   AST 31 07/14/2017 0856   AST 16 07/11/2017 0941   ALT 22 07/14/2017 0856   ALT 16 07/11/2017 0941   ALKPHOS 106 07/14/2017 0856   ALKPHOS 142 07/11/2017 0941   BILITOT 0.5 07/14/2017 0856   BILITOT 0.50 07/11/2017 0941   GFRNONAA >60 07/14/2017 0856   GFRAA >60 07/14/2017 0856    Lab Results  Component Value Date   CEA1 162.61 (H) 07/11/2017     Imaging: 07/14/2017 CT images-reviewed   Medications: I have reviewed the patient's current medications.   Assessment/Plan: 1. 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 Staging CTs 08/18/2016 with nonspecific pulmonary nodules, liver metastases, borderline thoracic and gastrohepatic ligament lymphadenopathy ? Cycle 1 FOLFOX 09/04/2016 ? Cycle 2 FOLFOX 09/18/2016 ? Cycle 3 FOLFOX 10/02/2016 ? Cycle 4 FOLFOX 10/16/2016 ? Cycle 5 FOLFOX 10/30/2016 ? Restaging CTs 11/10/2016-decreased wall thickness distal esophagus; stable borderline mediastinal lymphadenopathy; multiple liver metastases have completely resolved; stable scattered nonspecific tiny bilateral lung nodules ? Cycle 6 FOLFOX 11/13/2016 ? Cycle 7 FOLFOX 11/27/2016 ? Cycle 8 FOLFOX 12/11/2016 ? Cycle 9 FOLFOX 12/25/2016 ? Cycle 10 FOLFOX (oxaliplatin deleted) 01/08/2017 ? Initiation of maintenance therapy with 5-FU/leucovorin 02/06/2017 ? CTs 03/16/2017-stable pulmonary nodules, no esophagus mass,  1 subtle liver lesion ? Maintenance therapy continued with 5-FU/leucovorin every 3 weeks beginning 03/20/2017 ? CTs 06/29/2017-progressive liver metastases ? Cycle 1 Taxol/ramucirumab 07/12/2017, allergic reaction to Taxol ? Emergency room with right upper quadrant pain 07/14/2017-CT feels enlargement of liver lesions ? Cycle 1 day 8 07/18/2017-Abraxane substituted for Taxol 2. Diabetes  3.  COPD  4. Hypertension  5. History of colon polyps  6. Port-A-Cath placement 08/30/2016  7. 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  8. Right upper extremity deep vein thrombosis, Port-A-Cath related 08/24/2018started Xarelto 03/16/2017, possible subsegmental right lower lobe pulmonary embolus on CT 03/16/2017  9.Gross hematuria 05/24/2017-status post bladder irrigation in the emergency room, referred to urology   Disposition: Mr. Coutts appears unchanged.  He had an allergic reaction to Taxol last week.  He is here for cycle 1 day 8 chemotherapy.  We decided to substitute Abraxane for Taxol.  He understands the chance of neuropathy and the uncommon allergic reaction associated with Abraxane.  He agrees to proceed.  He will return for Abraxane/ramucirumab on 07/26/2017.  Mr. Otten developed increased right upper quadrant pain last week and was seen in the emergency room.  A CT revealed enlargement of liver lesions.  I explained it is too early to judge the effectiveness of the Abraxane/ramucirumab regimen.  Mr. Eckstein will return for an office visit and Taxol/ramucirumab 08/09/2017.  25 minutes were spent with the patient today.  The majority of the time was used for counseling and coordination of care.  Betsy Coder, MD  07/18/2017  2:47 PM

## 2017-07-18 NOTE — Patient Instructions (Signed)

## 2017-07-22 ENCOUNTER — Other Ambulatory Visit: Payer: Self-pay | Admitting: Oncology

## 2017-07-25 ENCOUNTER — Other Ambulatory Visit: Payer: Medicare Other

## 2017-07-26 ENCOUNTER — Ambulatory Visit (HOSPITAL_BASED_OUTPATIENT_CLINIC_OR_DEPARTMENT_OTHER): Payer: Medicare Other

## 2017-07-26 ENCOUNTER — Other Ambulatory Visit (HOSPITAL_BASED_OUTPATIENT_CLINIC_OR_DEPARTMENT_OTHER): Payer: Medicare Other

## 2017-07-26 VITALS — BP 136/84 | HR 84 | Temp 98.5°F | Resp 16 | Wt 193.2 lb

## 2017-07-26 DIAGNOSIS — Z5189 Encounter for other specified aftercare: Secondary | ICD-10-CM

## 2017-07-26 DIAGNOSIS — C155 Malignant neoplasm of lower third of esophagus: Secondary | ICD-10-CM

## 2017-07-26 DIAGNOSIS — Z5111 Encounter for antineoplastic chemotherapy: Secondary | ICD-10-CM

## 2017-07-26 DIAGNOSIS — Z95828 Presence of other vascular implants and grafts: Secondary | ICD-10-CM

## 2017-07-26 DIAGNOSIS — Z5112 Encounter for antineoplastic immunotherapy: Secondary | ICD-10-CM

## 2017-07-26 LAB — COMPREHENSIVE METABOLIC PANEL
ALBUMIN: 3.4 g/dL — AB (ref 3.5–5.0)
ALK PHOS: 136 U/L (ref 40–150)
ALT: 26 U/L (ref 0–55)
ANION GAP: 8 meq/L (ref 3–11)
AST: 18 U/L (ref 5–34)
BILIRUBIN TOTAL: 0.31 mg/dL (ref 0.20–1.20)
BUN: 10.7 mg/dL (ref 7.0–26.0)
CALCIUM: 9.2 mg/dL (ref 8.4–10.4)
CO2: 24 meq/L (ref 22–29)
CREATININE: 0.9 mg/dL (ref 0.7–1.3)
Chloride: 106 mEq/L (ref 98–109)
EGFR: >60 mL/min/{1.73_m2} (ref >60)
Glucose: 129 mg/dl (ref 70–140)
Potassium: 3.9 mEq/L (ref 3.5–5.1)
Sodium: 138 mEq/L (ref 136–145)
TOTAL PROTEIN: 7.1 g/dL (ref 6.4–8.3)

## 2017-07-26 LAB — CBC WITH DIFFERENTIAL/PLATELET
BASO%: 0.2 % (ref 0.0–2.0)
Basophils Absolute: 0 10*3/uL (ref 0.0–0.1)
EOS ABS: 0.3 10*3/uL (ref 0.0–0.5)
EOS%: 2.5 % (ref 0.0–7.0)
HEMATOCRIT: 42.4 % (ref 38.4–49.9)
HEMOGLOBIN: 14.2 g/dL (ref 13.0–17.1)
LYMPH#: 2.2 10*3/uL (ref 0.9–3.3)
LYMPH%: 21.4 % (ref 14.0–49.0)
MCH: 30.5 pg (ref 27.2–33.4)
MCHC: 33.5 g/dL (ref 32.0–36.0)
MCV: 91 fL (ref 79.3–98.0)
MONO#: 0.8 10*3/uL (ref 0.1–0.9)
MONO%: 7.4 % (ref 0.0–14.0)
NEUT#: 7 10*3/uL — ABNORMAL HIGH (ref 1.5–6.5)
NEUT%: 68.5 % (ref 39.0–75.0)
Platelets: 212 10*3/uL (ref 140–400)
RBC: 4.66 10*6/uL (ref 4.20–5.82)
RDW: 14 % (ref 11.0–14.6)
WBC: 10.2 10*3/uL (ref 4.0–10.3)

## 2017-07-26 MED ORDER — PROCHLORPERAZINE MALEATE 10 MG PO TABS
10.0000 mg | ORAL_TABLET | Freq: Once | ORAL | Status: AC
  Administered 2017-07-26: 10 mg via ORAL

## 2017-07-26 MED ORDER — PROCHLORPERAZINE MALEATE 10 MG PO TABS
ORAL_TABLET | ORAL | Status: AC
  Filled 2017-07-26: qty 1

## 2017-07-26 MED ORDER — ALTEPLASE 2 MG IJ SOLR
2.0000 mg | Freq: Once | INTRAMUSCULAR | Status: AC | PRN
  Administered 2017-07-26: 2 mg
  Filled 2017-07-26: qty 2

## 2017-07-26 MED ORDER — SODIUM CHLORIDE 0.9 % IV SOLN
Freq: Once | INTRAVENOUS | Status: AC
  Administered 2017-07-26: 15:00:00 via INTRAVENOUS

## 2017-07-26 MED ORDER — DEXAMETHASONE SODIUM PHOSPHATE 10 MG/ML IJ SOLN
10.0000 mg | Freq: Once | INTRAMUSCULAR | Status: AC
  Administered 2017-07-26: 10 mg via INTRAVENOUS

## 2017-07-26 MED ORDER — DIPHENHYDRAMINE HCL 50 MG/ML IJ SOLN
INTRAMUSCULAR | Status: AC
  Filled 2017-07-26: qty 1

## 2017-07-26 MED ORDER — SODIUM CHLORIDE 0.9% FLUSH
10.0000 mL | INTRAVENOUS | Status: DC | PRN
  Administered 2017-07-26: 10 mL
  Filled 2017-07-26: qty 10

## 2017-07-26 MED ORDER — DIPHENHYDRAMINE HCL 50 MG/ML IJ SOLN
25.0000 mg | Freq: Once | INTRAMUSCULAR | Status: AC
  Administered 2017-07-26: 25 mg via INTRAVENOUS

## 2017-07-26 MED ORDER — SODIUM CHLORIDE 0.9 % IV SOLN
8.0000 mg/kg | Freq: Once | INTRAVENOUS | Status: AC
  Administered 2017-07-26: 700 mg via INTRAVENOUS
  Filled 2017-07-26: qty 50

## 2017-07-26 MED ORDER — PACLITAXEL PROTEIN-BOUND CHEMO INJECTION 100 MG
200.0000 mg | Freq: Once | INTRAVENOUS | Status: AC
  Administered 2017-07-26: 200 mg via INTRAVENOUS
  Filled 2017-07-26: qty 40

## 2017-07-26 MED ORDER — ACETAMINOPHEN 325 MG PO TABS
650.0000 mg | ORAL_TABLET | Freq: Once | ORAL | Status: AC
  Administered 2017-07-26: 650 mg via ORAL

## 2017-07-26 MED ORDER — DEXAMETHASONE SODIUM PHOSPHATE 10 MG/ML IJ SOLN
INTRAMUSCULAR | Status: AC
  Filled 2017-07-26: qty 1

## 2017-07-26 MED ORDER — ACETAMINOPHEN 325 MG PO TABS
ORAL_TABLET | ORAL | Status: AC
  Filled 2017-07-26: qty 2

## 2017-07-26 MED ORDER — HEPARIN SOD (PORK) LOCK FLUSH 100 UNIT/ML IV SOLN
500.0000 [IU] | Freq: Once | INTRAVENOUS | Status: AC | PRN
  Administered 2017-07-26: 500 [IU]
  Filled 2017-07-26: qty 5

## 2017-07-26 MED ORDER — SODIUM CHLORIDE 0.9% FLUSH
10.0000 mL | INTRAVENOUS | Status: DC | PRN
  Administered 2017-07-26: 10 mL via INTRAVENOUS
  Filled 2017-07-26: qty 10

## 2017-07-26 NOTE — Patient Instructions (Signed)
[facility] Discharge Instructions for Patients Receiving Chemotherapy  Today you received the following chemotherapy agents Abraxane and Cyramza.  To help prevent nausea and vomiting after your treatment, we encourage you to take your nausea medication as directed.   If you develop nausea and vomiting that is not controlled by your nausea medication, call the clinic.   BELOW ARE SYMPTOMS THAT SHOULD BE REPORTED IMMEDIATELY:  *FEVER GREATER THAN 100.5 F  *CHILLS WITH OR WITHOUT FEVER  NAUSEA AND VOMITING THAT IS NOT CONTROLLED WITH YOUR NAUSEA MEDICATION  *UNUSUAL SHORTNESS OF BREATH  *UNUSUAL BRUISING OR BLEEDING  TENDERNESS IN MOUTH AND THROAT WITH OR WITHOUT PRESENCE OF ULCERS  *URINARY PROBLEMS  *BOWEL PROBLEMS  UNUSUAL RASH Items with * indicate a potential emergency and should be followed up as soon as possible.  Feel free to call the clinic should you have any questions or concerns. The clinic phone number is [phone].  Please show the [place] at check-in to the Emergency Department and triage nurse.

## 2017-07-30 ENCOUNTER — Ambulatory Visit: Payer: Medicare Other | Admitting: Oncology

## 2017-07-30 ENCOUNTER — Ambulatory Visit: Payer: Medicare Other

## 2017-07-30 ENCOUNTER — Other Ambulatory Visit: Payer: Medicare Other

## 2017-08-02 ENCOUNTER — Telehealth: Payer: Self-pay | Admitting: Oncology

## 2017-08-02 NOTE — Telephone Encounter (Signed)
Patient called regarding Dr. Bettina Gavia up so Melia told me to put on Lisa schedule for the 1/17. It was not on the 12/26 orders

## 2017-08-05 ENCOUNTER — Other Ambulatory Visit: Payer: Self-pay | Admitting: Oncology

## 2017-08-09 ENCOUNTER — Inpatient Hospital Stay: Payer: Medicare Other | Attending: Oncology

## 2017-08-09 ENCOUNTER — Inpatient Hospital Stay: Payer: Medicare Other

## 2017-08-09 ENCOUNTER — Encounter: Payer: Self-pay | Admitting: Nurse Practitioner

## 2017-08-09 ENCOUNTER — Other Ambulatory Visit: Payer: Medicare Other

## 2017-08-09 ENCOUNTER — Telehealth: Payer: Self-pay | Admitting: Nurse Practitioner

## 2017-08-09 ENCOUNTER — Inpatient Hospital Stay (HOSPITAL_BASED_OUTPATIENT_CLINIC_OR_DEPARTMENT_OTHER): Payer: Medicare Other | Admitting: Nurse Practitioner

## 2017-08-09 VITALS — BP 153/75 | HR 94 | Temp 98.0°F | Resp 18 | Ht 70.0 in | Wt 196.8 lb

## 2017-08-09 DIAGNOSIS — J449 Chronic obstructive pulmonary disease, unspecified: Secondary | ICD-10-CM | POA: Insufficient documentation

## 2017-08-09 DIAGNOSIS — C787 Secondary malignant neoplasm of liver and intrahepatic bile duct: Secondary | ICD-10-CM | POA: Diagnosis not present

## 2017-08-09 DIAGNOSIS — Z5111 Encounter for antineoplastic chemotherapy: Secondary | ICD-10-CM | POA: Diagnosis not present

## 2017-08-09 DIAGNOSIS — E119 Type 2 diabetes mellitus without complications: Secondary | ICD-10-CM | POA: Diagnosis not present

## 2017-08-09 DIAGNOSIS — C155 Malignant neoplasm of lower third of esophagus: Secondary | ICD-10-CM

## 2017-08-09 DIAGNOSIS — G629 Polyneuropathy, unspecified: Secondary | ICD-10-CM | POA: Diagnosis not present

## 2017-08-09 DIAGNOSIS — I10 Essential (primary) hypertension: Secondary | ICD-10-CM | POA: Insufficient documentation

## 2017-08-09 DIAGNOSIS — Z95828 Presence of other vascular implants and grafts: Secondary | ICD-10-CM

## 2017-08-09 DIAGNOSIS — Z7901 Long term (current) use of anticoagulants: Secondary | ICD-10-CM

## 2017-08-09 DIAGNOSIS — Z86010 Personal history of colonic polyps: Secondary | ICD-10-CM | POA: Diagnosis not present

## 2017-08-09 DIAGNOSIS — I82621 Acute embolism and thrombosis of deep veins of right upper extremity: Secondary | ICD-10-CM | POA: Insufficient documentation

## 2017-08-09 DIAGNOSIS — Z5112 Encounter for antineoplastic immunotherapy: Secondary | ICD-10-CM | POA: Insufficient documentation

## 2017-08-09 LAB — COMPREHENSIVE METABOLIC PANEL
ALBUMIN: 3.5 g/dL (ref 3.5–5.0)
ALK PHOS: 127 U/L (ref 40–150)
ALT: 18 U/L (ref 0–55)
ANION GAP: 10 (ref 3–11)
AST: 18 U/L (ref 5–34)
BUN: 9 mg/dL (ref 7–26)
CALCIUM: 9.3 mg/dL (ref 8.4–10.4)
CO2: 20 mmol/L — AB (ref 22–29)
Chloride: 108 mmol/L (ref 98–109)
Creatinine, Ser: 0.81 mg/dL (ref 0.70–1.30)
GFR calc Af Amer: >60 mL/min (ref >60)
GFR calc non Af Amer: >60 mL/min (ref >60)
Glucose, Bld: 157 mg/dL — ABNORMAL HIGH (ref 70–140)
Potassium: 3.7 mmol/L (ref 3.5–5.1)
SODIUM: 138 mmol/L (ref 136–145)
Total Bilirubin: 0.4 mg/dL (ref 0.2–1.2)
Total Protein: 7.3 g/dL (ref 6.4–8.3)

## 2017-08-09 LAB — CBC WITH DIFFERENTIAL/PLATELET
BASOS ABS: 0.1 10*3/uL (ref 0.0–0.1)
BASOS PCT: 1 %
EOS ABS: 0.1 10*3/uL (ref 0.0–0.5)
Eosinophils Relative: 1 %
HEMATOCRIT: 44 % (ref 38.4–49.9)
HEMOGLOBIN: 14.5 g/dL (ref 13.0–17.1)
Lymphocytes Relative: 23 %
Lymphs Abs: 2.1 10*3/uL (ref 0.9–3.3)
MCH: 29.7 pg (ref 27.2–33.4)
MCHC: 33 g/dL (ref 32.0–36.0)
MCV: 89.8 fL (ref 79.3–98.0)
Monocytes Absolute: 1.4 10*3/uL — ABNORMAL HIGH (ref 0.1–0.9)
Monocytes Relative: 15 %
NEUTROS ABS: 5.6 10*3/uL (ref 1.5–6.5)
NEUTROS PCT: 60 %
Platelets: 207 10*3/uL (ref 140–400)
RBC: 4.9 MIL/uL (ref 4.20–5.82)
RDW: 14.5 % (ref 11.0–15.6)
Smear Review: 2
WBC: 9.3 10*3/uL (ref 4.0–10.3)

## 2017-08-09 LAB — CEA (IN HOUSE-CHCC): CEA (CHCC-In House): 101.27 ng/mL — ABNORMAL HIGH (ref 0.00–5.00)

## 2017-08-09 MED ORDER — PROCHLORPERAZINE MALEATE 10 MG PO TABS
10.0000 mg | ORAL_TABLET | Freq: Once | ORAL | Status: AC
  Administered 2017-08-09: 10 mg via ORAL

## 2017-08-09 MED ORDER — SODIUM CHLORIDE 0.9 % IV SOLN
8.0000 mg/kg | Freq: Once | INTRAVENOUS | Status: AC
  Administered 2017-08-09: 700 mg via INTRAVENOUS
  Filled 2017-08-09: qty 50

## 2017-08-09 MED ORDER — ALTEPLASE 2 MG IJ SOLR
INTRAMUSCULAR | Status: AC
  Filled 2017-08-09: qty 2

## 2017-08-09 MED ORDER — PACLITAXEL PROTEIN-BOUND CHEMO INJECTION 100 MG
200.0000 mg | Freq: Once | INTRAVENOUS | Status: AC
  Administered 2017-08-09: 200 mg via INTRAVENOUS
  Filled 2017-08-09: qty 40

## 2017-08-09 MED ORDER — DIPHENHYDRAMINE HCL 25 MG PO CAPS
ORAL_CAPSULE | ORAL | Status: AC
  Filled 2017-08-09: qty 2

## 2017-08-09 MED ORDER — SODIUM CHLORIDE 0.9% FLUSH
10.0000 mL | INTRAVENOUS | Status: DC | PRN
  Administered 2017-08-09: 10 mL via INTRAVENOUS
  Filled 2017-08-09: qty 10

## 2017-08-09 MED ORDER — HEPARIN SOD (PORK) LOCK FLUSH 100 UNIT/ML IV SOLN
500.0000 [IU] | Freq: Once | INTRAVENOUS | Status: AC | PRN
  Administered 2017-08-09: 500 [IU]
  Filled 2017-08-09: qty 5

## 2017-08-09 MED ORDER — DIPHENHYDRAMINE HCL 50 MG/ML IJ SOLN
INTRAMUSCULAR | Status: AC
  Filled 2017-08-09: qty 1

## 2017-08-09 MED ORDER — PROCHLORPERAZINE MALEATE 10 MG PO TABS
ORAL_TABLET | ORAL | Status: AC
  Filled 2017-08-09: qty 1

## 2017-08-09 MED ORDER — DEXAMETHASONE SODIUM PHOSPHATE 10 MG/ML IJ SOLN
10.0000 mg | Freq: Once | INTRAMUSCULAR | Status: AC
  Administered 2017-08-09: 10 mg via INTRAVENOUS

## 2017-08-09 MED ORDER — ALTEPLASE 2 MG IJ SOLR
2.0000 mg | Freq: Once | INTRAMUSCULAR | Status: AC | PRN
  Administered 2017-08-09: 2 mg
  Filled 2017-08-09: qty 2

## 2017-08-09 MED ORDER — SODIUM CHLORIDE 0.9% FLUSH
10.0000 mL | INTRAVENOUS | Status: DC | PRN
  Administered 2017-08-09: 10 mL
  Filled 2017-08-09: qty 10

## 2017-08-09 MED ORDER — ACETAMINOPHEN 325 MG PO TABS
ORAL_TABLET | ORAL | Status: AC
  Filled 2017-08-09: qty 2

## 2017-08-09 MED ORDER — ACETAMINOPHEN 325 MG PO TABS
650.0000 mg | ORAL_TABLET | Freq: Once | ORAL | Status: AC
  Administered 2017-08-09: 650 mg via ORAL

## 2017-08-09 MED ORDER — DIPHENHYDRAMINE HCL 50 MG/ML IJ SOLN
25.0000 mg | Freq: Once | INTRAMUSCULAR | Status: AC
  Administered 2017-08-09: 25 mg via INTRAVENOUS

## 2017-08-09 MED ORDER — SODIUM CHLORIDE 0.9 % IV SOLN
Freq: Once | INTRAVENOUS | Status: AC
  Administered 2017-08-09: 12:00:00 via INTRAVENOUS

## 2017-08-09 MED ORDER — DEXAMETHASONE SODIUM PHOSPHATE 10 MG/ML IJ SOLN
INTRAMUSCULAR | Status: AC
  Filled 2017-08-09: qty 1

## 2017-08-09 NOTE — Progress Notes (Signed)
  [facility] OFFICE PROGRESS NOTE   Diagnosis: Esophagus cancer  INTERVAL HISTORY:   Justin [name] returns as scheduled.  He completed cycle 1 day 15 Abraxane/ramucirumab 07/26/2017.  He denies nausea/vomiting.  No mouth sores.  No diarrhea.  Occasional blood with nose blowing.  No other bleeding.  He has a good appetite.  No dysphagia.  He denies pain.  No change in baseline neuropathy symptoms.  Objective:  Vital signs in last 24 hours:  Blood pressure (!) 153/75, pulse 94, temperature 98 F (36.7 C), temperature source Oral, resp. rate 18, height '5\' 10"'$  (1.778 m), weight 196 lb 12.8 oz (89.3 kg), SpO2 98 %.    HEENT: No thrush or ulcers. Lymphatics: No palpable cervical or supraclavicular lymph nodes. Resp: Lungs clear bilaterally. Cardio: Regular rate and rhythm. GI: Abdomen soft and nontender.  No hepatomegaly. Vascular: No leg edema.  Port-A-Cath without erythema.  Lab Results:  Lab Results  Component Value Date   WBC 9.3 08/09/2017   HGB 14.5 08/09/2017   HCT 44.0 08/09/2017   MCV 89.8 08/09/2017   PLT 207 08/09/2017   NEUTROABS 5.6 08/09/2017    Imaging:  No results found.  Medications: I have reviewed the patient's current medications.  Assessment/Plan: 1. 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 Staging CTs 08/18/2016 with nonspecific pulmonary nodules, liver metastases, borderline thoracic and gastrohepatic ligament lymphadenopathy ? Cycle 1 FOLFOX 09/04/2016 ? Cycle 2 FOLFOX 09/18/2016 ? Cycle 3 FOLFOX 10/02/2016 ? Cycle 4 FOLFOX 10/16/2016 ? Cycle 5 FOLFOX 10/30/2016 ? Restaging CTs 11/10/2016-decreased wall thickness distal esophagus; stable borderline mediastinal lymphadenopathy; multiple liver metastases have completely resolved; stable scattered nonspecific tiny bilateral lung nodules ? Cycle 6 FOLFOX 11/13/2016 ? Cycle 7 FOLFOX 11/27/2016 ? Cycle 8 FOLFOX  12/11/2016 ? Cycle 9 FOLFOX 12/25/2016 ? Cycle 10 FOLFOX (oxaliplatin deleted) 01/08/2017 ? Initiation of maintenance therapy with 5-FU/leucovorin 02/06/2017 ? CTs 03/16/2017-stable pulmonary nodules, no esophagus mass, 1 subtle liver lesion ? Maintenance therapy continued with 5-FU/leucovorin every 3 weeks beginning 03/20/2017 ? CTs 06/29/2017-progressive liver metastases ? Cycle 1 Taxol/ramucirumab 07/12/2017, allergic reaction to Taxol ? Emergency room with right upper quadrant pain 07/14/2017-CT feels enlargement of liver lesions ? Cycle 1 day 8 07/18/2017-Abraxane substituted for Taxol ? Cycle 2-day 1 Abraxane/ramucirumab 08/09/2017 2. Diabetes  3. COPD  4. Hypertension  5. History of colon polyps  6. Port-A-Cath placement 08/30/2016  7. 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  8. Right upper extremity deep vein thrombosis, Port-A-Cath related 08/24/2018started Xarelto 03/16/2017, possible subsegmental right lower lobe pulmonary embolus on CT 03/16/2017  9.Gross hematuria 05/24/2017-status post bladder irrigation in the emergency room, referred to urology    Disposition: [name] appears stable.  Plan to proceed with cycle 2 Abraxane/ramucirumab today as scheduled.  He will return for day 8 Abraxane 08/16/2017.  We will see him in follow-up prior to the day 15 treatment on 08/23/2017.  He will contact the office in the interim with any problems.    Ned Card ANP/GNP-BC   08/09/2017  10:52 AM

## 2017-08-09 NOTE — Telephone Encounter (Signed)
Scheduled appt per 1/17 los - Unable to schedule appt on 1/31 - scheduled for 2/1 - Gave patient AVS and calender per los.

## 2017-08-09 NOTE — Patient Instructions (Signed)
[facility] Discharge Instructions for Patients Receiving Chemotherapy  Today you received the following chemotherapy agents Abraxane and Cyramza.  To help prevent nausea and vomiting after your treatment, we encourage you to take your nausea medication as directed.   If you develop nausea and vomiting that is not controlled by your nausea medication, call the clinic.   BELOW ARE SYMPTOMS THAT SHOULD BE REPORTED IMMEDIATELY:  *FEVER GREATER THAN 100.5 F  *CHILLS WITH OR WITHOUT FEVER  NAUSEA AND VOMITING THAT IS NOT CONTROLLED WITH YOUR NAUSEA MEDICATION  *UNUSUAL SHORTNESS OF BREATH  *UNUSUAL BRUISING OR BLEEDING  TENDERNESS IN MOUTH AND THROAT WITH OR WITHOUT PRESENCE OF ULCERS  *URINARY PROBLEMS  *BOWEL PROBLEMS  UNUSUAL RASH Items with * indicate a potential emergency and should be followed up as soon as possible.  Feel free to call the clinic should you have any questions or concerns. The clinic phone number is [phone].  Please show the [facility] at check-in to the Emergency Department and triage nurse.

## 2017-08-09 NOTE — Progress Notes (Signed)
Port flushed several times with no blood return. Labs were drawn peripherally by lab 3. Ned Card desk nurse called Luiz Iron) and RN will administer Cathflow. Keilon Ressel LPN

## 2017-08-10 ENCOUNTER — Telehealth: Payer: Self-pay | Admitting: Emergency Medicine

## 2017-08-10 NOTE — Telephone Encounter (Addendum)
Called patients home to inform him of this note. Left message for pt to call back with "Lattie Haw". Will follow up   ----- Message from Ladell Pier, MD sent at 08/09/2017  7:28 PM EST ----- Please call patient, cea is better, f/u as scheduled

## 2017-08-12 ENCOUNTER — Other Ambulatory Visit: Payer: Self-pay | Admitting: Oncology

## 2017-08-16 ENCOUNTER — Inpatient Hospital Stay: Payer: Medicare Other

## 2017-08-16 ENCOUNTER — Telehealth: Payer: Self-pay | Admitting: *Deleted

## 2017-08-16 ENCOUNTER — Other Ambulatory Visit: Payer: Self-pay | Admitting: *Deleted

## 2017-08-16 ENCOUNTER — Other Ambulatory Visit: Payer: Self-pay | Admitting: Nurse Practitioner

## 2017-08-16 VITALS — BP 136/79 | HR 81 | Temp 97.6°F | Resp 17

## 2017-08-16 DIAGNOSIS — I10 Essential (primary) hypertension: Secondary | ICD-10-CM | POA: Diagnosis not present

## 2017-08-16 DIAGNOSIS — Z5111 Encounter for antineoplastic chemotherapy: Secondary | ICD-10-CM | POA: Diagnosis not present

## 2017-08-16 DIAGNOSIS — Z7901 Long term (current) use of anticoagulants: Secondary | ICD-10-CM | POA: Diagnosis not present

## 2017-08-16 DIAGNOSIS — Z95828 Presence of other vascular implants and grafts: Secondary | ICD-10-CM

## 2017-08-16 DIAGNOSIS — C155 Malignant neoplasm of lower third of esophagus: Secondary | ICD-10-CM

## 2017-08-16 DIAGNOSIS — E119 Type 2 diabetes mellitus without complications: Secondary | ICD-10-CM | POA: Diagnosis not present

## 2017-08-16 DIAGNOSIS — C787 Secondary malignant neoplasm of liver and intrahepatic bile duct: Secondary | ICD-10-CM | POA: Diagnosis not present

## 2017-08-16 DIAGNOSIS — J449 Chronic obstructive pulmonary disease, unspecified: Secondary | ICD-10-CM | POA: Diagnosis not present

## 2017-08-16 DIAGNOSIS — Z86010 Personal history of colonic polyps: Secondary | ICD-10-CM | POA: Diagnosis not present

## 2017-08-16 DIAGNOSIS — Z5112 Encounter for antineoplastic immunotherapy: Secondary | ICD-10-CM | POA: Diagnosis not present

## 2017-08-16 DIAGNOSIS — I82621 Acute embolism and thrombosis of deep veins of right upper extremity: Secondary | ICD-10-CM | POA: Diagnosis not present

## 2017-08-16 DIAGNOSIS — G629 Polyneuropathy, unspecified: Secondary | ICD-10-CM | POA: Diagnosis not present

## 2017-08-16 LAB — CMP (CANCER CENTER ONLY)
ALT: 15 U/L (ref 0–55)
AST: 16 U/L (ref 5–34)
Albumin: 2.9 g/dL — ABNORMAL LOW (ref 3.5–5.0)
Alkaline Phosphatase: 91 U/L (ref 40–150)
Anion gap: 9 (ref 3–11)
BILIRUBIN TOTAL: 0.3 mg/dL (ref 0.2–1.2)
BUN: 8 mg/dL (ref 7–26)
CALCIUM: 8.1 mg/dL — AB (ref 8.4–10.4)
CO2: 22 mmol/L (ref 22–29)
CREATININE: 0.71 mg/dL (ref 0.70–1.30)
Chloride: 109 mmol/L (ref 98–109)
GFR, Est AFR Am: >60 mL/min (ref >60)
Glucose, Bld: 145 mg/dL — ABNORMAL HIGH (ref 70–140)
POTASSIUM: 2.9 mmol/L — AB (ref 3.5–5.1)
Sodium: 140 mmol/L (ref 136–145)
TOTAL PROTEIN: 5.9 g/dL — AB (ref 6.4–8.3)

## 2017-08-16 LAB — CBC WITH DIFFERENTIAL (CANCER CENTER ONLY)
BASOS ABS: 0 10*3/uL (ref 0.0–0.1)
BASOS PCT: 1 %
EOS ABS: 0 10*3/uL (ref 0.0–0.5)
EOS PCT: 1 %
HCT: 32.8 % — ABNORMAL LOW (ref 38.4–49.9)
Hemoglobin: 11 g/dL — ABNORMAL LOW (ref 13.0–17.1)
LYMPHS PCT: 32 %
Lymphs Abs: 1.4 10*3/uL (ref 0.9–3.3)
MCH: 29.8 pg (ref 27.2–33.4)
MCHC: 33.5 g/dL (ref 32.0–36.0)
MCV: 88.9 fL (ref 79.3–98.0)
Monocytes Absolute: 0.3 10*3/uL (ref 0.1–0.9)
Monocytes Relative: 7 %
Neutro Abs: 2.7 10*3/uL (ref 1.5–6.5)
Neutrophils Relative %: 59 %
PLATELETS: 130 10*3/uL — AB (ref 140–400)
RBC: 3.69 MIL/uL — AB (ref 4.20–5.82)
RDW: 14.1 % (ref 11.0–15.6)
WBC: 4.5 10*3/uL (ref 4.0–10.3)

## 2017-08-16 LAB — UA PROTEIN, DIPSTICK - CHCC: Protein, ur: NEGATIVE mg/dL

## 2017-08-16 MED ORDER — PROCHLORPERAZINE MALEATE 10 MG PO TABS
ORAL_TABLET | ORAL | Status: AC
  Filled 2017-08-16: qty 1

## 2017-08-16 MED ORDER — POTASSIUM CHLORIDE CRYS ER 20 MEQ PO TBCR: 20.0000 meq | EXTENDED_RELEASE_TABLET | Freq: Two times a day (BID) | ORAL | 0 refills | Status: DC

## 2017-08-16 MED ORDER — PROCHLORPERAZINE MALEATE 10 MG PO TABS
10.0000 mg | ORAL_TABLET | Freq: Once | ORAL | Status: AC
  Administered 2017-08-16: 10 mg via ORAL

## 2017-08-16 MED ORDER — SODIUM CHLORIDE 0.9 % IV SOLN
Freq: Once | INTRAVENOUS | Status: AC
  Administered 2017-08-16: 14:00:00 via INTRAVENOUS

## 2017-08-16 MED ORDER — PACLITAXEL PROTEIN-BOUND CHEMO INJECTION 100 MG
200.0000 mg | Freq: Once | INTRAVENOUS | Status: AC
  Administered 2017-08-16: 200 mg via INTRAVENOUS
  Filled 2017-08-16: qty 40

## 2017-08-16 MED ORDER — SODIUM CHLORIDE 0.9% FLUSH
10.0000 mL | INTRAVENOUS | Status: DC | PRN
  Administered 2017-08-16: 10 mL
  Filled 2017-08-16: qty 10

## 2017-08-16 MED ORDER — HEPARIN SOD (PORK) LOCK FLUSH 100 UNIT/ML IV SOLN
500.0000 [IU] | Freq: Once | INTRAVENOUS | Status: AC | PRN
  Administered 2017-08-16: 500 [IU]
  Filled 2017-08-16: qty 5

## 2017-08-16 MED ORDER — DIPHENHYDRAMINE HCL 50 MG/ML IJ SOLN
25.0000 mg | Freq: Once | INTRAMUSCULAR | Status: AC
  Administered 2017-08-16: 25 mg via INTRAVENOUS

## 2017-08-16 MED ORDER — SODIUM CHLORIDE 0.9% FLUSH
10.0000 mL | INTRAVENOUS | Status: DC | PRN
  Administered 2017-08-16: 10 mL via INTRAVENOUS
  Filled 2017-08-16: qty 10

## 2017-08-16 MED ORDER — DIPHENHYDRAMINE HCL 50 MG/ML IJ SOLN
INTRAMUSCULAR | Status: AC
Start: 2017-08-16 — End: 2017-08-16
  Filled 2017-08-16: qty 1

## 2017-08-16 NOTE — Telephone Encounter (Signed)
Ned Card, NP notified of potassium level 2.9. Order received for pt to take 42meq BID for 3 days then take one daily. Repeat lab in one week. Spoke with pt in lobby, he reports Dr. Redmond School prescribed potassium but he stopped taking it due to cramping in his hands. Pt instructed to resume potassium twice daily for 3 days, then take one daily. He agreed to do so, stated he has "a bottle full."

## 2017-08-16 NOTE — Telephone Encounter (Signed)
-----   Message from Owens Shark, NP sent at 08/16/2017  5:13 PM EST ----- Please let him know hemoglobin was lower on lab work today.  Is he having any bleeding?  Please add type and screen to labs next week.  Thanks

## 2017-08-16 NOTE — Telephone Encounter (Signed)
Called & spoke with daughter-in-law & informed that Hgb was lower today & asked if any bleeding.  Pt reports having some nose bleeds when he blows his nose & stops easily.  Will add T & screen to lab next week.

## 2017-08-19 ENCOUNTER — Other Ambulatory Visit: Payer: Self-pay | Admitting: Oncology

## 2017-08-24 ENCOUNTER — Inpatient Hospital Stay: Payer: Medicare Other

## 2017-08-24 ENCOUNTER — Encounter: Payer: Self-pay | Admitting: Nurse Practitioner

## 2017-08-24 ENCOUNTER — Inpatient Hospital Stay: Payer: Medicare Other | Attending: Oncology

## 2017-08-24 ENCOUNTER — Inpatient Hospital Stay (HOSPITAL_BASED_OUTPATIENT_CLINIC_OR_DEPARTMENT_OTHER): Payer: Medicare Other | Admitting: Nurse Practitioner

## 2017-08-24 VITALS — BP 150/82 | HR 94 | Temp 97.6°F | Resp 18 | Ht 70.0 in | Wt 190.2 lb

## 2017-08-24 DIAGNOSIS — T451X5S Adverse effect of antineoplastic and immunosuppressive drugs, sequela: Secondary | ICD-10-CM | POA: Diagnosis not present

## 2017-08-24 DIAGNOSIS — R531 Weakness: Secondary | ICD-10-CM | POA: Diagnosis not present

## 2017-08-24 DIAGNOSIS — Z79899 Other long term (current) drug therapy: Secondary | ICD-10-CM | POA: Diagnosis not present

## 2017-08-24 DIAGNOSIS — R1011 Right upper quadrant pain: Secondary | ICD-10-CM | POA: Insufficient documentation

## 2017-08-24 DIAGNOSIS — D701 Agranulocytosis secondary to cancer chemotherapy: Secondary | ICD-10-CM | POA: Diagnosis not present

## 2017-08-24 DIAGNOSIS — C787 Secondary malignant neoplasm of liver and intrahepatic bile duct: Secondary | ICD-10-CM | POA: Insufficient documentation

## 2017-08-24 DIAGNOSIS — C155 Malignant neoplasm of lower third of esophagus: Secondary | ICD-10-CM | POA: Insufficient documentation

## 2017-08-24 DIAGNOSIS — F329 Major depressive disorder, single episode, unspecified: Secondary | ICD-10-CM | POA: Insufficient documentation

## 2017-08-24 DIAGNOSIS — E119 Type 2 diabetes mellitus without complications: Secondary | ICD-10-CM | POA: Insufficient documentation

## 2017-08-24 DIAGNOSIS — Z5112 Encounter for antineoplastic immunotherapy: Secondary | ICD-10-CM | POA: Insufficient documentation

## 2017-08-24 DIAGNOSIS — R200 Anesthesia of skin: Secondary | ICD-10-CM | POA: Diagnosis not present

## 2017-08-24 DIAGNOSIS — J449 Chronic obstructive pulmonary disease, unspecified: Secondary | ICD-10-CM | POA: Insufficient documentation

## 2017-08-24 DIAGNOSIS — Z95828 Presence of other vascular implants and grafts: Secondary | ICD-10-CM

## 2017-08-24 DIAGNOSIS — R0609 Other forms of dyspnea: Secondary | ICD-10-CM | POA: Insufficient documentation

## 2017-08-24 DIAGNOSIS — R5383 Other fatigue: Secondary | ICD-10-CM | POA: Insufficient documentation

## 2017-08-24 DIAGNOSIS — Z86010 Personal history of colonic polyps: Secondary | ICD-10-CM | POA: Diagnosis not present

## 2017-08-24 DIAGNOSIS — I10 Essential (primary) hypertension: Secondary | ICD-10-CM | POA: Diagnosis not present

## 2017-08-24 DIAGNOSIS — Z5111 Encounter for antineoplastic chemotherapy: Secondary | ICD-10-CM | POA: Diagnosis not present

## 2017-08-24 DIAGNOSIS — H532 Diplopia: Secondary | ICD-10-CM | POA: Insufficient documentation

## 2017-08-24 DIAGNOSIS — Z86711 Personal history of pulmonary embolism: Secondary | ICD-10-CM | POA: Insufficient documentation

## 2017-08-24 DIAGNOSIS — R040 Epistaxis: Secondary | ICD-10-CM | POA: Diagnosis not present

## 2017-08-24 DIAGNOSIS — R2689 Other abnormalities of gait and mobility: Secondary | ICD-10-CM | POA: Insufficient documentation

## 2017-08-24 DIAGNOSIS — Z86718 Personal history of other venous thrombosis and embolism: Secondary | ICD-10-CM | POA: Diagnosis not present

## 2017-08-24 LAB — CMP (CANCER CENTER ONLY)
ALBUMIN: 3.7 g/dL (ref 3.5–5.0)
ALK PHOS: 117 U/L (ref 40–150)
ALT: 21 U/L (ref 0–55)
ANION GAP: 10 (ref 3–11)
AST: 24 U/L (ref 5–34)
BUN: 10 mg/dL (ref 7–26)
CALCIUM: 8.9 mg/dL (ref 8.4–10.4)
CHLORIDE: 103 mmol/L (ref 98–109)
CO2: 21 mmol/L — ABNORMAL LOW (ref 22–29)
Creatinine: 0.76 mg/dL (ref 0.70–1.30)
GFR, Estimated: >60 mL/min (ref >60)
GLUCOSE: 112 mg/dL (ref 70–140)
POTASSIUM: 3.9 mmol/L (ref 3.5–5.1)
SODIUM: 134 mmol/L — AB (ref 136–145)
Total Bilirubin: 0.5 mg/dL (ref 0.2–1.2)
Total Protein: 7.2 g/dL (ref 6.4–8.3)

## 2017-08-24 LAB — CBC WITH DIFFERENTIAL (CANCER CENTER ONLY)
Basophils Absolute: 0 10*3/uL (ref 0.0–0.1)
Basophils Relative: 0 %
Eosinophils Absolute: 0 10*3/uL (ref 0.0–0.5)
Eosinophils Relative: 1 %
HEMATOCRIT: 40.2 % (ref 38.4–49.9)
HEMOGLOBIN: 13.7 g/dL (ref 13.0–17.1)
LYMPHS ABS: 1.2 10*3/uL (ref 0.9–3.3)
Lymphocytes Relative: 41 %
MCH: 29.9 pg (ref 27.2–33.4)
MCHC: 34.1 g/dL (ref 32.0–36.0)
MCV: 87.8 fL (ref 79.3–98.0)
MONO ABS: 0.3 10*3/uL (ref 0.1–0.9)
MONOS PCT: 9 %
NEUTROS ABS: 1.4 10*3/uL — AB (ref 1.5–6.5)
NEUTROS PCT: 49 %
Platelet Count: 150 10*3/uL (ref 140–400)
RBC: 4.58 MIL/uL (ref 4.20–5.82)
RDW: 14.3 % (ref 11.0–14.6)
WBC Count: 2.9 10*3/uL — ABNORMAL LOW (ref 4.0–10.3)

## 2017-08-24 LAB — CEA (IN HOUSE-CHCC): CEA (CHCC-In House): 53 ng/mL — ABNORMAL HIGH (ref 0.00–5.00)

## 2017-08-24 LAB — UA PROTEIN, DIPSTICK - CHCC: Protein, ur: NEGATIVE mg/dL

## 2017-08-24 MED ORDER — SODIUM CHLORIDE 0.9% FLUSH
10.0000 mL | INTRAVENOUS | Status: DC | PRN
  Administered 2017-08-24: 10 mL
  Filled 2017-08-24: qty 10

## 2017-08-24 MED ORDER — DEXAMETHASONE SODIUM PHOSPHATE 10 MG/ML IJ SOLN
INTRAMUSCULAR | Status: AC
  Filled 2017-08-24: qty 1

## 2017-08-24 MED ORDER — DIPHENHYDRAMINE HCL 50 MG/ML IJ SOLN
25.0000 mg | Freq: Once | INTRAMUSCULAR | Status: AC
  Administered 2017-08-24: 25 mg via INTRAVENOUS

## 2017-08-24 MED ORDER — HEPARIN SOD (PORK) LOCK FLUSH 100 UNIT/ML IV SOLN
500.0000 [IU] | Freq: Once | INTRAVENOUS | Status: AC | PRN
  Administered 2017-08-24: 500 [IU]
  Filled 2017-08-24: qty 5

## 2017-08-24 MED ORDER — PROCHLORPERAZINE MALEATE 10 MG PO TABS
10.0000 mg | ORAL_TABLET | Freq: Once | ORAL | Status: AC
  Administered 2017-08-24: 10 mg via ORAL

## 2017-08-24 MED ORDER — ACETAMINOPHEN 325 MG PO TABS
ORAL_TABLET | ORAL | Status: AC
Start: 2017-08-24 — End: 2017-08-24
  Filled 2017-08-24: qty 2

## 2017-08-24 MED ORDER — SODIUM CHLORIDE 0.9 % IV SOLN
8.0000 mg/kg | Freq: Once | INTRAVENOUS | Status: AC
  Administered 2017-08-24: 700 mg via INTRAVENOUS
  Filled 2017-08-24: qty 50

## 2017-08-24 MED ORDER — DIPHENHYDRAMINE HCL 50 MG/ML IJ SOLN
INTRAMUSCULAR | Status: AC
  Filled 2017-08-24: qty 1

## 2017-08-24 MED ORDER — PACLITAXEL PROTEIN-BOUND CHEMO INJECTION 100 MG
200.0000 mg | Freq: Once | INTRAVENOUS | Status: AC
  Administered 2017-08-24: 200 mg via INTRAVENOUS
  Filled 2017-08-24: qty 40

## 2017-08-24 MED ORDER — ALTEPLASE 2 MG IJ SOLR
2.0000 mg | Freq: Once | INTRAMUSCULAR | Status: AC | PRN
  Administered 2017-08-24: 2 mg
  Filled 2017-08-24: qty 2

## 2017-08-24 MED ORDER — ALTEPLASE 2 MG IJ SOLR
INTRAMUSCULAR | Status: AC
  Filled 2017-08-24: qty 2

## 2017-08-24 MED ORDER — SODIUM CHLORIDE 0.9 % IV SOLN
INTRAVENOUS | Status: DC
  Administered 2017-08-24: 12:00:00 via INTRAVENOUS

## 2017-08-24 MED ORDER — PROCHLORPERAZINE MALEATE 10 MG PO TABS
ORAL_TABLET | ORAL | Status: AC
  Filled 2017-08-24: qty 1

## 2017-08-24 MED ORDER — ACETAMINOPHEN 500 MG PO TABS
ORAL_TABLET | ORAL | Status: AC
  Filled 2017-08-24: qty 2

## 2017-08-24 MED ORDER — ACETAMINOPHEN 325 MG PO TABS
650.0000 mg | ORAL_TABLET | Freq: Once | ORAL | Status: AC
  Administered 2017-08-24: 650 mg via ORAL

## 2017-08-24 MED ORDER — DEXAMETHASONE SODIUM PHOSPHATE 10 MG/ML IJ SOLN
10.0000 mg | Freq: Once | INTRAMUSCULAR | Status: AC
  Administered 2017-08-24: 10 mg via INTRAVENOUS

## 2017-08-24 NOTE — Progress Notes (Signed)
[facility] OFFICE PROGRESS NOTE   Diagnosis: Esophagus cancer  INTERVAL HISTORY:   Justin [name] returns as scheduled.  He completed cycle 2-day 8 Abraxane 08/16/2017.  He denies nausea/vomiting.  No mouth sores.  No diarrhea or constipation.  He has stable numbness in the fingertips and toes.  For the past several weeks he has had intermittent sharp pain at the right upper abdomen lasting 5 or 6 seconds.  This tends to occur 7 or 8 times a day.  He denies any bleeding except related to sinuses.  No black stools.  Objective:  Vital signs in last 24 hours:  Blood pressure (!) 150/82, pulse 94, temperature 97.6 F (36.4 C), temperature source Oral, resp. rate 18, height '5\' 10"'$  (1.778 m), weight 190 lb 3.2 oz (86.3 kg), SpO2 100 %.    HEENT: No thrush or ulcers. Resp: Lungs clear bilaterally. Cardio: Regular rate and rhythm. GI: Abdomen soft and nontender.  No hepatomegaly.  No mass. Vascular: No leg edema.  Port-A-Cath without erythema.  Lab Results:  Lab Results  Component Value Date   WBC 4.5 08/16/2017   HGB 14.5 08/09/2017   HCT 32.8 (L) 08/16/2017   MCV 88.9 08/16/2017   PLT 130 (L) 08/16/2017   NEUTROABS 2.7 08/16/2017    Imaging:  No results found.  Medications: I have reviewed the patient's current medications.  Assessment/Plan: 1. 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 Staging CTs 08/18/2016 with nonspecific pulmonary nodules, liver metastases, borderline thoracic and gastrohepatic ligament lymphadenopathy ? Cycle 1 FOLFOX 09/04/2016 ? Cycle 2 FOLFOX 09/18/2016 ? Cycle 3 FOLFOX 10/02/2016 ? Cycle 4 FOLFOX 10/16/2016 ? Cycle 5 FOLFOX 10/30/2016 ? Restaging CTs 11/10/2016-decreased wall thickness distal esophagus; stable borderline mediastinal lymphadenopathy; multiple liver metastases have completely resolved; stable scattered nonspecific tiny bilateral lung nodules ? Cycle 6  FOLFOX 11/13/2016 ? Cycle 7 FOLFOX 11/27/2016 ? Cycle 8 FOLFOX 12/11/2016 ? Cycle 9 FOLFOX 12/25/2016 ? Cycle 10 FOLFOX (oxaliplatin deleted) 01/08/2017 ? Initiation of maintenance therapy with 5-FU/leucovorin 02/06/2017 ? CTs 03/16/2017-stable pulmonary nodules, no esophagus mass, 1 subtle liver lesion ? Maintenance therapy continued with 5-FU/leucovorin every 3 weeks beginning 03/20/2017 ? CTs 06/29/2017-progressive liver metastases ? Cycle 1 Taxol/ramucirumab 07/12/2017, allergic reaction to Taxol ? Emergency room with right upper quadrant pain 07/14/2017-CT feels enlargement of liver lesions ? Cycle 1 day 8 07/18/2017-Abraxane substituted for Taxol ? Cycle 2-day 1 Abraxane/ramucirumab 08/09/2017 ? CEA improved 08/09/2017 2. Diabetes  3. COPD  4. Hypertension  5. History of colon polyps  6. Port-A-Cath placement 08/30/2016  7. 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  8. Right upper extremity deep vein thrombosis, Port-A-Cath related 08/24/2018started Xarelto 03/16/2017, possible subsegmental right lower lobe pulmonary embolus on CT 03/16/2017  9.Gross hematuria 05/24/2017-status post bladder irrigation in the emergency room, referred to urology     Disposition: Justin [name] appears stable.  He will complete cycle 2-day 15 Abraxane/cyramza today as scheduled.  He will return to begin cycle 3 on 09/06/2017.  We will refer him for restaging CTs after he has completed cycle 3.  We reviewed the CBC from today.  Hemoglobin has returned to baseline.  The etiology of the decreased value from last week is not known.  No clinical evidence of bleeding.  We will see him in follow-up on 09/06/2017.  He will contact the office in the interim with any problems.  We specifically discussed worsening abdominal pain.  Plan reviewed with Dr. Benay Spice.  25 minutes were spent face-to-face at today's visit with the majority of that time  involved in counseling/coordination of care.  Ned Card ANP/GNP-BC   08/24/2017  10:07 AM

## 2017-08-24 NOTE — Progress Notes (Signed)
Ok to treat with ANC of 1.4 per Dr. Benay Spice.

## 2017-08-24 NOTE — Patient Instructions (Signed)
[facility] Discharge Instructions for Patients Receiving Chemotherapy  Today you received the following chemotherapy agents Abraxane and Cyramza.  To help prevent nausea and vomiting after your treatment, we encourage you to take your nausea medication as directed.  If you develop nausea and vomiting that is not controlled by your nausea medication, call the clinic.   BELOW ARE SYMPTOMS THAT SHOULD BE REPORTED IMMEDIATELY:  *FEVER GREATER THAN 100.5 F  *CHILLS WITH OR WITHOUT FEVER  NAUSEA AND VOMITING THAT IS NOT CONTROLLED WITH YOUR NAUSEA MEDICATION  *UNUSUAL SHORTNESS OF BREATH  *UNUSUAL BRUISING OR BLEEDING  TENDERNESS IN MOUTH AND THROAT WITH OR WITHOUT PRESENCE OF ULCERS  *URINARY PROBLEMS  *BOWEL PROBLEMS  UNUSUAL RASH Items with * indicate a potential emergency and should be followed up as soon as possible.  Feel free to call the clinic should you have any questions or concerns. The clinic phone number is [phone].  Please show the [facility] at check-in to the Emergency Department and triage nurse.

## 2017-08-24 NOTE — Progress Notes (Signed)
Pt arrived for PAC flush and lab draw. Unable to get blood return after several minutes of port aerobics. 2 mg of CathFlow instilled at 09:40. Arrived pt to Justin [name]-NP appt and updated the desk nurse Justin [name] who verbalized that she will check blood return at the 30 min mark (10:10). Tubes sent with pt who verbalized understanding of plan.

## 2017-09-02 ENCOUNTER — Other Ambulatory Visit: Payer: Self-pay | Admitting: Oncology

## 2017-09-06 ENCOUNTER — Inpatient Hospital Stay: Payer: Medicare Other

## 2017-09-06 ENCOUNTER — Encounter: Payer: Self-pay | Admitting: Nurse Practitioner

## 2017-09-06 ENCOUNTER — Inpatient Hospital Stay (HOSPITAL_BASED_OUTPATIENT_CLINIC_OR_DEPARTMENT_OTHER): Payer: Medicare Other | Admitting: Nurse Practitioner

## 2017-09-06 ENCOUNTER — Telehealth: Payer: Self-pay | Admitting: Nurse Practitioner

## 2017-09-06 VITALS — BP 140/90 | HR 85 | Temp 97.7°F | Resp 18 | Ht 70.0 in | Wt 197.9 lb

## 2017-09-06 DIAGNOSIS — H532 Diplopia: Secondary | ICD-10-CM | POA: Diagnosis not present

## 2017-09-06 DIAGNOSIS — Z79899 Other long term (current) drug therapy: Secondary | ICD-10-CM | POA: Diagnosis not present

## 2017-09-06 DIAGNOSIS — T451X5S Adverse effect of antineoplastic and immunosuppressive drugs, sequela: Secondary | ICD-10-CM | POA: Diagnosis not present

## 2017-09-06 DIAGNOSIS — Z5111 Encounter for antineoplastic chemotherapy: Secondary | ICD-10-CM | POA: Diagnosis not present

## 2017-09-06 DIAGNOSIS — R2689 Other abnormalities of gait and mobility: Secondary | ICD-10-CM | POA: Diagnosis not present

## 2017-09-06 DIAGNOSIS — I10 Essential (primary) hypertension: Secondary | ICD-10-CM

## 2017-09-06 DIAGNOSIS — R040 Epistaxis: Secondary | ICD-10-CM | POA: Diagnosis not present

## 2017-09-06 DIAGNOSIS — E119 Type 2 diabetes mellitus without complications: Secondary | ICD-10-CM | POA: Diagnosis not present

## 2017-09-06 DIAGNOSIS — Z95828 Presence of other vascular implants and grafts: Secondary | ICD-10-CM | POA: Diagnosis not present

## 2017-09-06 DIAGNOSIS — R1011 Right upper quadrant pain: Secondary | ICD-10-CM | POA: Diagnosis not present

## 2017-09-06 DIAGNOSIS — R5383 Other fatigue: Secondary | ICD-10-CM | POA: Diagnosis not present

## 2017-09-06 DIAGNOSIS — D701 Agranulocytosis secondary to cancer chemotherapy: Secondary | ICD-10-CM | POA: Diagnosis not present

## 2017-09-06 DIAGNOSIS — Z86711 Personal history of pulmonary embolism: Secondary | ICD-10-CM | POA: Diagnosis not present

## 2017-09-06 DIAGNOSIS — R0609 Other forms of dyspnea: Secondary | ICD-10-CM | POA: Diagnosis not present

## 2017-09-06 DIAGNOSIS — F329 Major depressive disorder, single episode, unspecified: Secondary | ICD-10-CM

## 2017-09-06 DIAGNOSIS — Z5112 Encounter for antineoplastic immunotherapy: Secondary | ICD-10-CM | POA: Diagnosis not present

## 2017-09-06 DIAGNOSIS — C787 Secondary malignant neoplasm of liver and intrahepatic bile duct: Secondary | ICD-10-CM | POA: Diagnosis not present

## 2017-09-06 DIAGNOSIS — J449 Chronic obstructive pulmonary disease, unspecified: Secondary | ICD-10-CM | POA: Diagnosis not present

## 2017-09-06 DIAGNOSIS — R200 Anesthesia of skin: Secondary | ICD-10-CM | POA: Diagnosis not present

## 2017-09-06 DIAGNOSIS — C155 Malignant neoplasm of lower third of esophagus: Secondary | ICD-10-CM

## 2017-09-06 DIAGNOSIS — Z86010 Personal history of colonic polyps: Secondary | ICD-10-CM

## 2017-09-06 DIAGNOSIS — R531 Weakness: Secondary | ICD-10-CM | POA: Diagnosis not present

## 2017-09-06 DIAGNOSIS — Z86718 Personal history of other venous thrombosis and embolism: Secondary | ICD-10-CM | POA: Diagnosis not present

## 2017-09-06 LAB — CBC WITH DIFFERENTIAL (CANCER CENTER ONLY)
Basophils Absolute: 0.1 10*3/uL (ref 0.0–0.1)
Basophils Relative: 1 %
Eosinophils Absolute: 0 10*3/uL (ref 0.0–0.5)
Eosinophils Relative: 0 %
HCT: 39.6 % (ref 38.4–49.9)
HEMOGLOBIN: 13.2 g/dL (ref 13.0–17.1)
LYMPHS ABS: 1.9 10*3/uL (ref 0.9–3.3)
LYMPHS PCT: 22 %
MCH: 29.7 pg (ref 27.2–33.4)
MCHC: 33.3 g/dL (ref 32.0–36.0)
MCV: 89 fL (ref 79.3–98.0)
Monocytes Absolute: 1.9 10*3/uL — ABNORMAL HIGH (ref 0.1–0.9)
Monocytes Relative: 22 %
NEUTROS ABS: 4.9 10*3/uL (ref 1.5–6.5)
Neutrophils Relative %: 55 %
Platelet Count: 186 10*3/uL (ref 140–400)
RBC: 4.45 MIL/uL (ref 4.20–5.82)
RDW: 15.5 % — ABNORMAL HIGH (ref 11.0–14.6)
WBC: 8.8 10*3/uL (ref 4.0–10.3)

## 2017-09-06 LAB — CMP (CANCER CENTER ONLY)
ALT: 17 U/L (ref 0–55)
ANION GAP: 9 (ref 3–11)
AST: 18 U/L (ref 5–34)
Albumin: 3.4 g/dL — ABNORMAL LOW (ref 3.5–5.0)
Alkaline Phosphatase: 110 U/L (ref 40–150)
BUN: 9 mg/dL (ref 7–26)
CHLORIDE: 105 mmol/L (ref 98–109)
CO2: 23 mmol/L (ref 22–29)
Calcium: 9.1 mg/dL (ref 8.4–10.4)
Creatinine: 0.77 mg/dL (ref 0.70–1.30)
Glucose, Bld: 140 mg/dL (ref 70–140)
POTASSIUM: 3.8 mmol/L (ref 3.5–5.1)
Sodium: 137 mmol/L (ref 136–145)
Total Bilirubin: 0.4 mg/dL (ref 0.2–1.2)
Total Protein: 6.8 g/dL (ref 6.4–8.3)

## 2017-09-06 MED ORDER — ACETAMINOPHEN 325 MG PO TABS
650.0000 mg | ORAL_TABLET | Freq: Once | ORAL | Status: AC
  Administered 2017-09-06: 650 mg via ORAL

## 2017-09-06 MED ORDER — SODIUM CHLORIDE 0.9% FLUSH
10.0000 mL | INTRAVENOUS | Status: DC | PRN
  Administered 2017-09-06: 10 mL
  Filled 2017-09-06: qty 10

## 2017-09-06 MED ORDER — MIRTAZAPINE 15 MG PO TABS: 15.0000 mg | ORAL_TABLET | Freq: Every day | ORAL | 0 refills | Status: AC

## 2017-09-06 MED ORDER — PROCHLORPERAZINE MALEATE 10 MG PO TABS
10.0000 mg | ORAL_TABLET | Freq: Once | ORAL | Status: AC
  Administered 2017-09-06: 10 mg via ORAL

## 2017-09-06 MED ORDER — PACLITAXEL PROTEIN-BOUND CHEMO INJECTION 100 MG
200.0000 mg | Freq: Once | INTRAVENOUS | Status: AC
  Administered 2017-09-06: 200 mg via INTRAVENOUS
  Filled 2017-09-06: qty 40

## 2017-09-06 MED ORDER — DEXAMETHASONE SODIUM PHOSPHATE 10 MG/ML IJ SOLN
INTRAMUSCULAR | Status: AC
  Filled 2017-09-06: qty 1

## 2017-09-06 MED ORDER — PROCHLORPERAZINE MALEATE 10 MG PO TABS
ORAL_TABLET | ORAL | Status: AC
Start: 2017-09-06 — End: 2017-09-06
  Filled 2017-09-06: qty 1

## 2017-09-06 MED ORDER — SODIUM CHLORIDE 0.9 % IV SOLN
8.0000 mg/kg | Freq: Once | INTRAVENOUS | Status: AC
  Administered 2017-09-06: 700 mg via INTRAVENOUS
  Filled 2017-09-06: qty 50

## 2017-09-06 MED ORDER — DIPHENHYDRAMINE HCL 50 MG/ML IJ SOLN
INTRAMUSCULAR | Status: AC
Start: 2017-09-06 — End: 2017-09-06
  Filled 2017-09-06: qty 1

## 2017-09-06 MED ORDER — SODIUM CHLORIDE 0.9 % IV SOLN
Freq: Once | INTRAVENOUS | Status: AC
  Administered 2017-09-06: 13:00:00 via INTRAVENOUS

## 2017-09-06 MED ORDER — ALTEPLASE 2 MG IJ SOLR
INTRAMUSCULAR | Status: AC
  Filled 2017-09-06: qty 2

## 2017-09-06 MED ORDER — HEPARIN SOD (PORK) LOCK FLUSH 100 UNIT/ML IV SOLN
500.0000 [IU] | Freq: Once | INTRAVENOUS | Status: AC | PRN
  Administered 2017-09-06: 500 [IU]
  Filled 2017-09-06: qty 5

## 2017-09-06 MED ORDER — DIPHENHYDRAMINE HCL 50 MG/ML IJ SOLN
25.0000 mg | Freq: Once | INTRAMUSCULAR | Status: AC
  Administered 2017-09-06: 25 mg via INTRAVENOUS

## 2017-09-06 MED ORDER — ACETAMINOPHEN 325 MG PO TABS
ORAL_TABLET | ORAL | Status: AC
Start: 2017-09-06 — End: 2017-09-06
  Filled 2017-09-06: qty 2

## 2017-09-06 MED ORDER — ALTEPLASE 2 MG IJ SOLR
2.0000 mg | Freq: Once | INTRAMUSCULAR | Status: AC | PRN
  Administered 2017-09-06: 2 mg
  Filled 2017-09-06: qty 2

## 2017-09-06 MED ORDER — DEXAMETHASONE SODIUM PHOSPHATE 10 MG/ML IJ SOLN
10.0000 mg | Freq: Once | INTRAMUSCULAR | Status: AC
  Administered 2017-09-06: 10 mg via INTRAVENOUS

## 2017-09-06 NOTE — Telephone Encounter (Signed)
Scheduled appt per 2/14 los - Gave patient AVS and calender per los.  

## 2017-09-06 NOTE — Progress Notes (Addendum)
Justin [name]   Diagnosis: Esophagus cancer  INTERVAL HISTORY:   Justin [name] returns as scheduled.  He completed cycle 2-day 15 Abraxane/ramucirumab 08/24/2017.  He denies nausea/vomiting.  No mouth sores.  No diarrhea.  Stable numbness in the fingertips and toes.  He denies dysphagia.  He has good appetite.  He is gaining weight.  He notes mild bleeding when he blows his nose.  No other bleeding.  He thinks he is depressed.  He denies suicidal ideation.  He would like to try an antidepressant.  Objective:  Vital signs in last 24 hours:  Blood pressure 140/90, pulse 85, temperature 97.7 F (36.5 C), temperature source Oral, resp. rate 18, height _0  (1.778 m), weight 197 lb 14.4 oz (89.8 kg), SpO2 99 %.    HEENT: No thrush or ulcers. Lymphatics: No palpable cervical or supraclavicular lymph nodes. Resp: Lungs clear bilaterally. Cardio: Regular rate and rhythm. GI: Abdomen soft and nontender.  No hepatomegaly.  No mass. Vascular: No leg edema. Port-A-Cath without erythema.  Lab Results:  Lab Results  Component Value Date   WBC 8.8 09/06/2017   HGB 14.5 08/09/2017   HCT 39.6 09/06/2017   MCV 89.0 09/06/2017   PLT 186 09/06/2017   NEUTROABS 4.9 09/06/2017    Imaging:  No results found.  Medications: I have reviewed the patient's current medications.  Assessment/Plan: 1. 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 Staging CTs 08/18/2016 with nonspecific pulmonary nodules, liver metastases, borderline thoracic and gastrohepatic ligament lymphadenopathy ? Cycle 1 FOLFOX 09/04/2016 ? Cycle 2 FOLFOX 09/18/2016 ? Cycle 3 FOLFOX 10/02/2016 ? Cycle 4 FOLFOX 10/16/2016 ? Cycle 5 FOLFOX 10/30/2016 ? Restaging CTs 11/10/2016-decreased wall thickness distal esophagus; stable borderline mediastinal lymphadenopathy; multiple liver metastases have completely resolved; stable scattered  nonspecific tiny bilateral lung nodules ? Cycle 6 FOLFOX 11/13/2016 ? Cycle 7 FOLFOX 11/27/2016 ? Cycle 8 FOLFOX 12/11/2016 ? Cycle 9 FOLFOX 12/25/2016 ? Cycle 10 FOLFOX (oxaliplatin deleted) 01/08/2017 ? Initiation of maintenance therapy with 5-FU/leucovorin 02/06/2017 ? CTs 03/16/2017-stable pulmonary nodules, no esophagus mass, 1 subtle liver lesion ? Maintenance therapy continued with 5-FU/leucovorin every 3 weeks beginning 03/20/2017 ? CTs 06/29/2017-progressive liver metastases ? Cycle 1 Taxol/ramucirumab 07/12/2017, allergic reaction to Taxol ? Emergency room with right upper quadrant pain 07/14/2017-CT with enlargement of liver lesions ? Cycle 1 day 8 07/18/2017-Abraxane substituted for Taxol ? Cycle 2-day 1 Abraxane/ramucirumab 08/09/2017 ? CEA improved 08/09/2017 ? Cycle 3-day 1 Abraxane/ramucirumab 09/06/2017 2. Diabetes  3. COPD  4. Hypertension  5. History of colon polyps  6. Port-A-Cath placement 08/30/2016  7. 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  8. Right upper extremity deep vein thrombosis, Port-A-Cath related 08/24/2018started Xarelto 03/16/2017, possible subsegmental right lower lobe pulmonary embolus on CT 03/16/2017  9.Gross hematuria 05/24/2017-status post bladder irrigation in the emergency room, referred to urology  10.  Depression.  Trial of Remeron initiated 09/06/2017.    Disposition: Justin [name] appears stable.  He has completed 2 cycles of Abraxane/ramucirumab.  Most recent CEA was improved.  He seems to be tolerating treatment well.  Plan to proceed with cycle 3-day 1 Abraxane/ramucirumab today as scheduled.  He would like to begin an antidepressant.  A prescription was sent to his pharmacy for Remeron 15 mg at bedtime.  He will return for a follow-up visit in 2 weeks.  He will contact the office in the interim with any problems.  Plan reviewed with Dr. Benay Spice.  Ned Card ANP/GNP-BC   09/06/2017  1:33 PM

## 2017-09-13 ENCOUNTER — Other Ambulatory Visit: Payer: Medicare Other

## 2017-09-13 ENCOUNTER — Inpatient Hospital Stay: Payer: Medicare Other

## 2017-09-13 VITALS — BP 120/76 | HR 87 | Temp 97.9°F | Resp 18

## 2017-09-13 DIAGNOSIS — R1011 Right upper quadrant pain: Secondary | ICD-10-CM | POA: Diagnosis not present

## 2017-09-13 DIAGNOSIS — R531 Weakness: Secondary | ICD-10-CM | POA: Diagnosis not present

## 2017-09-13 DIAGNOSIS — C155 Malignant neoplasm of lower third of esophagus: Secondary | ICD-10-CM

## 2017-09-13 DIAGNOSIS — Z95828 Presence of other vascular implants and grafts: Secondary | ICD-10-CM

## 2017-09-13 DIAGNOSIS — Z86711 Personal history of pulmonary embolism: Secondary | ICD-10-CM | POA: Diagnosis not present

## 2017-09-13 DIAGNOSIS — Z5112 Encounter for antineoplastic immunotherapy: Secondary | ICD-10-CM | POA: Diagnosis not present

## 2017-09-13 DIAGNOSIS — R5383 Other fatigue: Secondary | ICD-10-CM | POA: Diagnosis not present

## 2017-09-13 DIAGNOSIS — D701 Agranulocytosis secondary to cancer chemotherapy: Secondary | ICD-10-CM | POA: Diagnosis not present

## 2017-09-13 DIAGNOSIS — R2689 Other abnormalities of gait and mobility: Secondary | ICD-10-CM | POA: Diagnosis not present

## 2017-09-13 DIAGNOSIS — R0609 Other forms of dyspnea: Secondary | ICD-10-CM | POA: Diagnosis not present

## 2017-09-13 DIAGNOSIS — H532 Diplopia: Secondary | ICD-10-CM | POA: Diagnosis not present

## 2017-09-13 DIAGNOSIS — T451X5S Adverse effect of antineoplastic and immunosuppressive drugs, sequela: Secondary | ICD-10-CM | POA: Diagnosis not present

## 2017-09-13 DIAGNOSIS — Z86718 Personal history of other venous thrombosis and embolism: Secondary | ICD-10-CM | POA: Diagnosis not present

## 2017-09-13 DIAGNOSIS — R200 Anesthesia of skin: Secondary | ICD-10-CM | POA: Diagnosis not present

## 2017-09-13 DIAGNOSIS — C787 Secondary malignant neoplasm of liver and intrahepatic bile duct: Secondary | ICD-10-CM | POA: Diagnosis not present

## 2017-09-13 DIAGNOSIS — E119 Type 2 diabetes mellitus without complications: Secondary | ICD-10-CM | POA: Diagnosis not present

## 2017-09-13 DIAGNOSIS — R040 Epistaxis: Secondary | ICD-10-CM | POA: Diagnosis not present

## 2017-09-13 DIAGNOSIS — Z5111 Encounter for antineoplastic chemotherapy: Secondary | ICD-10-CM | POA: Diagnosis not present

## 2017-09-13 DIAGNOSIS — Z79899 Other long term (current) drug therapy: Secondary | ICD-10-CM | POA: Diagnosis not present

## 2017-09-13 DIAGNOSIS — J449 Chronic obstructive pulmonary disease, unspecified: Secondary | ICD-10-CM | POA: Diagnosis not present

## 2017-09-13 DIAGNOSIS — Z86010 Personal history of colonic polyps: Secondary | ICD-10-CM | POA: Diagnosis not present

## 2017-09-13 DIAGNOSIS — I10 Essential (primary) hypertension: Secondary | ICD-10-CM | POA: Diagnosis not present

## 2017-09-13 LAB — CMP (CANCER CENTER ONLY)
ALBUMIN: 3.3 g/dL — AB (ref 3.5–5.0)
ALK PHOS: 94 U/L (ref 40–150)
ALT: 17 U/L (ref 0–55)
AST: 18 U/L (ref 5–34)
Anion gap: 9 (ref 3–11)
BUN: 12 mg/dL (ref 7–26)
CHLORIDE: 105 mmol/L (ref 98–109)
CO2: 22 mmol/L (ref 22–29)
CREATININE: 0.71 mg/dL (ref 0.70–1.30)
Calcium: 9 mg/dL (ref 8.4–10.4)
GFR, Estimated: >60 mL/min (ref >60)
GLUCOSE: 126 mg/dL (ref 70–140)
Potassium: 3.4 mmol/L — ABNORMAL LOW (ref 3.5–5.1)
SODIUM: 136 mmol/L (ref 136–145)
Total Bilirubin: 0.4 mg/dL (ref 0.2–1.2)
Total Protein: 6.6 g/dL (ref 6.4–8.3)

## 2017-09-13 LAB — CBC WITH DIFFERENTIAL (CANCER CENTER ONLY)
Basophils Absolute: 0 10*3/uL (ref 0.0–0.1)
Basophils Relative: 1 %
EOS ABS: 0 10*3/uL (ref 0.0–0.5)
Eosinophils Relative: 0 %
HCT: 36.7 % — ABNORMAL LOW (ref 38.4–49.9)
HEMOGLOBIN: 12.1 g/dL — AB (ref 13.0–17.1)
LYMPHS ABS: 1.5 10*3/uL (ref 0.9–3.3)
Lymphocytes Relative: 24 %
MCH: 29.2 pg (ref 27.2–33.4)
MCHC: 33 g/dL (ref 32.0–36.0)
MCV: 88.4 fL (ref 79.3–98.0)
MONO ABS: 0.5 10*3/uL (ref 0.1–0.9)
MONOS PCT: 7 %
NEUTROS PCT: 68 %
Neutro Abs: 4.3 10*3/uL (ref 1.5–6.5)
Platelet Count: 136 10*3/uL — ABNORMAL LOW (ref 140–400)
RBC: 4.15 MIL/uL — ABNORMAL LOW (ref 4.20–5.82)
RDW: 15.4 % — AB (ref 11.0–14.6)
WBC Count: 6.3 10*3/uL (ref 4.0–10.3)

## 2017-09-13 LAB — TOTAL PROTEIN, URINE DIPSTICK: Protein, ur: NEGATIVE mg/dL

## 2017-09-13 MED ORDER — DIPHENHYDRAMINE HCL 50 MG/ML IJ SOLN
25.0000 mg | Freq: Once | INTRAMUSCULAR | Status: AC
  Administered 2017-09-13: 25 mg via INTRAVENOUS

## 2017-09-13 MED ORDER — SODIUM CHLORIDE 0.9 % IV SOLN
Freq: Once | INTRAVENOUS | Status: AC
  Administered 2017-09-13: 11:00:00 via INTRAVENOUS

## 2017-09-13 MED ORDER — ALTEPLASE 2 MG IJ SOLR
INTRAMUSCULAR | Status: AC
  Filled 2017-09-13: qty 2

## 2017-09-13 MED ORDER — HEPARIN SOD (PORK) LOCK FLUSH 100 UNIT/ML IV SOLN
500.0000 [IU] | Freq: Once | INTRAVENOUS | Status: AC | PRN
  Administered 2017-09-13: 500 [IU]
  Filled 2017-09-13: qty 5

## 2017-09-13 MED ORDER — SODIUM CHLORIDE 0.9% FLUSH
10.0000 mL | INTRAVENOUS | Status: AC | PRN
  Administered 2017-09-13: 10 mL
  Filled 2017-09-13: qty 10

## 2017-09-13 MED ORDER — PROCHLORPERAZINE MALEATE 10 MG PO TABS
10.0000 mg | ORAL_TABLET | Freq: Once | ORAL | Status: AC
  Administered 2017-09-13: 10 mg via ORAL

## 2017-09-13 MED ORDER — COLD PACK MISC ONCOLOGY
1.0000 | Freq: Once | Status: AC | PRN
  Filled 2017-09-13: qty 1

## 2017-09-13 MED ORDER — PACLITAXEL PROTEIN-BOUND CHEMO INJECTION 100 MG
90.0000 mg/m2 | Freq: Once | INTRAVENOUS | Status: AC
  Administered 2017-09-13: 200 mg via INTRAVENOUS
  Filled 2017-09-13: qty 40

## 2017-09-13 MED ORDER — DIPHENHYDRAMINE HCL 50 MG/ML IJ SOLN
INTRAMUSCULAR | Status: AC
  Filled 2017-09-13: qty 1

## 2017-09-13 MED ORDER — ALTEPLASE 2 MG IJ SOLR
2.0000 mg | Freq: Once | INTRAMUSCULAR | Status: AC | PRN
  Administered 2017-09-13: 2 mg
  Filled 2017-09-13: qty 2

## 2017-09-13 MED ORDER — PROCHLORPERAZINE MALEATE 10 MG PO TABS
ORAL_TABLET | ORAL | Status: AC
  Filled 2017-09-13: qty 1

## 2017-09-13 NOTE — Patient Instructions (Signed)
[facility] Discharge Instructions for Patients Receiving Chemotherapy  Today you received the following chemotherapy agents:  Abraxane.  To help prevent nausea and vomiting after your treatment, we encourage you to take your nausea medication as directed.   If you develop nausea and vomiting that is not controlled by your nausea medication, call the clinic.   BELOW ARE SYMPTOMS THAT SHOULD BE REPORTED IMMEDIATELY:  *FEVER GREATER THAN 100.5 F  *CHILLS WITH OR WITHOUT FEVER  NAUSEA AND VOMITING THAT IS NOT CONTROLLED WITH YOUR NAUSEA MEDICATION  *UNUSUAL SHORTNESS OF BREATH  *UNUSUAL BRUISING OR BLEEDING  TENDERNESS IN MOUTH AND THROAT WITH OR WITHOUT PRESENCE OF ULCERS  *URINARY PROBLEMS  *BOWEL PROBLEMS  UNUSUAL RASH Items with * indicate a potential emergency and should be followed up as soon as possible.  Feel free to call the clinic should you have any questions or concerns. The clinic phone number is [phone].  Please show the CHEMO ALERT CARD at check-in to the Emergency Department and triage nurse.   

## 2017-09-13 NOTE — Addendum Note (Signed)
Addended by: Marshell Garfinkel on: 09/13/2017 12:35 PM   Modules accepted: Orders

## 2017-09-13 NOTE — Addendum Note (Signed)
Addended by: Marshell Garfinkel on: 09/13/2017 02:36 PM   Modules accepted: Orders

## 2017-09-20 ENCOUNTER — Inpatient Hospital Stay: Payer: Medicare Other

## 2017-09-20 ENCOUNTER — Encounter: Payer: Self-pay | Admitting: Nurse Practitioner

## 2017-09-20 ENCOUNTER — Telehealth: Payer: Self-pay | Admitting: Nurse Practitioner

## 2017-09-20 ENCOUNTER — Inpatient Hospital Stay (HOSPITAL_BASED_OUTPATIENT_CLINIC_OR_DEPARTMENT_OTHER): Payer: Medicare Other | Admitting: Nurse Practitioner

## 2017-09-20 VITALS — BP 124/76 | HR 108 | Temp 97.7°F | Resp 20

## 2017-09-20 DIAGNOSIS — Z95828 Presence of other vascular implants and grafts: Secondary | ICD-10-CM

## 2017-09-20 DIAGNOSIS — Z5111 Encounter for antineoplastic chemotherapy: Secondary | ICD-10-CM | POA: Diagnosis not present

## 2017-09-20 DIAGNOSIS — Z86718 Personal history of other venous thrombosis and embolism: Secondary | ICD-10-CM

## 2017-09-20 DIAGNOSIS — Z86711 Personal history of pulmonary embolism: Secondary | ICD-10-CM

## 2017-09-20 DIAGNOSIS — R5383 Other fatigue: Secondary | ICD-10-CM

## 2017-09-20 DIAGNOSIS — C155 Malignant neoplasm of lower third of esophagus: Secondary | ICD-10-CM

## 2017-09-20 DIAGNOSIS — R040 Epistaxis: Secondary | ICD-10-CM

## 2017-09-20 DIAGNOSIS — Z79899 Other long term (current) drug therapy: Secondary | ICD-10-CM | POA: Diagnosis not present

## 2017-09-20 DIAGNOSIS — R531 Weakness: Secondary | ICD-10-CM | POA: Diagnosis not present

## 2017-09-20 DIAGNOSIS — R0609 Other forms of dyspnea: Secondary | ICD-10-CM | POA: Diagnosis not present

## 2017-09-20 DIAGNOSIS — E119 Type 2 diabetes mellitus without complications: Secondary | ICD-10-CM | POA: Diagnosis not present

## 2017-09-20 DIAGNOSIS — D701 Agranulocytosis secondary to cancer chemotherapy: Secondary | ICD-10-CM | POA: Diagnosis not present

## 2017-09-20 DIAGNOSIS — R1011 Right upper quadrant pain: Secondary | ICD-10-CM

## 2017-09-20 DIAGNOSIS — J449 Chronic obstructive pulmonary disease, unspecified: Secondary | ICD-10-CM | POA: Diagnosis not present

## 2017-09-20 DIAGNOSIS — Z86010 Personal history of colonic polyps: Secondary | ICD-10-CM | POA: Diagnosis not present

## 2017-09-20 DIAGNOSIS — C787 Secondary malignant neoplasm of liver and intrahepatic bile duct: Secondary | ICD-10-CM

## 2017-09-20 DIAGNOSIS — F329 Major depressive disorder, single episode, unspecified: Secondary | ICD-10-CM

## 2017-09-20 DIAGNOSIS — Z5112 Encounter for antineoplastic immunotherapy: Secondary | ICD-10-CM | POA: Diagnosis not present

## 2017-09-20 DIAGNOSIS — T451X5S Adverse effect of antineoplastic and immunosuppressive drugs, sequela: Secondary | ICD-10-CM | POA: Diagnosis not present

## 2017-09-20 DIAGNOSIS — R2689 Other abnormalities of gait and mobility: Secondary | ICD-10-CM | POA: Diagnosis not present

## 2017-09-20 DIAGNOSIS — H532 Diplopia: Secondary | ICD-10-CM

## 2017-09-20 DIAGNOSIS — R200 Anesthesia of skin: Secondary | ICD-10-CM | POA: Diagnosis not present

## 2017-09-20 DIAGNOSIS — I10 Essential (primary) hypertension: Secondary | ICD-10-CM

## 2017-09-20 LAB — CMP (CANCER CENTER ONLY)
ALBUMIN: 3.2 g/dL — AB (ref 3.5–5.0)
ALT: 14 U/L (ref 0–55)
AST: 19 U/L (ref 5–34)
Alkaline Phosphatase: 91 U/L (ref 40–150)
Anion gap: 10 (ref 3–11)
BUN: 10 mg/dL (ref 7–26)
CHLORIDE: 104 mmol/L (ref 98–109)
CO2: 23 mmol/L (ref 22–29)
Calcium: 9.3 mg/dL (ref 8.4–10.4)
Creatinine: 0.75 mg/dL (ref 0.70–1.30)
GFR, Est AFR Am: >60 mL/min (ref >60)
GFR, Estimated: >60 mL/min (ref >60)
GLUCOSE: 91 mg/dL (ref 70–140)
POTASSIUM: 3.5 mmol/L (ref 3.5–5.1)
SODIUM: 137 mmol/L (ref 136–145)
Total Bilirubin: 0.5 mg/dL (ref 0.2–1.2)
Total Protein: 6.9 g/dL (ref 6.4–8.3)

## 2017-09-20 LAB — CBC WITH DIFFERENTIAL (CANCER CENTER ONLY)
BASOS ABS: 0 10*3/uL (ref 0.0–0.1)
Basophils Relative: 1 %
Eosinophils Absolute: 0 10*3/uL (ref 0.0–0.5)
Eosinophils Relative: 1 %
HEMATOCRIT: 35.8 % — AB (ref 38.4–49.9)
Hemoglobin: 11.8 g/dL — ABNORMAL LOW (ref 13.0–17.1)
LYMPHS ABS: 1.1 10*3/uL (ref 0.9–3.3)
LYMPHS PCT: 45 %
MCH: 28.9 pg (ref 27.2–33.4)
MCHC: 33 g/dL (ref 32.0–36.0)
MCV: 87.6 fL (ref 79.3–98.0)
MONO ABS: 0.4 10*3/uL (ref 0.1–0.9)
MONOS PCT: 15 %
NEUTROS ABS: 0.9 10*3/uL — AB (ref 1.5–6.5)
Neutrophils Relative %: 38 %
PLATELETS: 160 10*3/uL (ref 140–400)
RBC: 4.08 MIL/uL — ABNORMAL LOW (ref 4.20–5.82)
RDW: 15.6 % — ABNORMAL HIGH (ref 11.0–14.6)
WBC Count: 2.4 10*3/uL — ABNORMAL LOW (ref 4.0–10.3)

## 2017-09-20 LAB — CEA (IN HOUSE-CHCC): CEA (CHCC-In House): 15.82 ng/mL — ABNORMAL HIGH (ref 0.00–5.00)

## 2017-09-20 MED ORDER — SODIUM CHLORIDE 0.9% FLUSH
10.0000 mL | INTRAVENOUS | Status: DC | PRN
  Administered 2017-09-20: 10 mL via INTRAVENOUS
  Filled 2017-09-20: qty 10

## 2017-09-20 MED ORDER — LIDOCAINE-PRILOCAINE 2.5-2.5 % EX CREA: 1.0000 "application " | TOPICAL_CREAM | CUTANEOUS | 2 refills | Status: AC | PRN

## 2017-09-20 MED ORDER — HEPARIN SOD (PORK) LOCK FLUSH 100 UNIT/ML IV SOLN
500.0000 [IU] | Freq: Once | INTRAVENOUS | Status: AC | PRN
  Administered 2017-09-20: 500 [IU] via INTRAVENOUS
  Filled 2017-09-20: qty 5

## 2017-09-20 MED ORDER — ALTEPLASE 2 MG IJ SOLR
INTRAMUSCULAR | Status: AC
Start: 2017-09-20 — End: 2017-09-20
  Filled 2017-09-20: qty 2

## 2017-09-20 MED ORDER — ALTEPLASE 2 MG IJ SOLR
2.0000 mg | Freq: Once | INTRAMUSCULAR | Status: DC | PRN
Start: 2017-09-20 — End: 2017-09-20
  Filled 2017-09-20: qty 2

## 2017-09-20 MED ORDER — ALTEPLASE 2 MG IJ SOLR
2.0000 mg | Freq: Once | INTRAMUSCULAR | Status: AC | PRN
  Administered 2017-09-20: 2 mg
  Filled 2017-09-20: qty 2

## 2017-09-20 NOTE — Progress Notes (Addendum)
[place] [facility] OFFICE PROGRESS NOTE   Diagnosis: Esophagus cancer  INTERVAL HISTORY:   Justin [name] returns as scheduled.  He completed cycle 3-day 1 Abraxane/ramucirumab 09/06/2017.  He denies nausea/vomiting.  No mouth sores.  No diarrhea.  He feels weak.  He has progressive dyspnea on exertion.  No fever.  He reports balance problems that have progressively worsened over the past month.  He feels he is favoring the right side.  He notes generalized weakness but no focal weakness.  No falls.  No unusual headaches.  He has periodic "double vision".  No confusion.  He has had multiple nosebleeds since his last visit.  No other bleeding.  He is fatigued.  He thinks the numbness in his feet has worsened.  Objective:  Vital signs in last 24 hours:  Blood pressure 137/81, pulse (!) 102, temperature 97.7 F (36.5 C), temperature source Oral, resp. rate 20, SpO2 99 %.    HEENT: Pupils equal round and reactive to light.  Extraocular movements intact.  Sclera anicteric.  Oropharynx is without thrush or ulceration. Lymphatics: No palpable cervical or supraclavicular lymph nodes. Resp: Distant breath sounds.  No respiratory distress. Cardio: Regular rate and rhythm. GI: Abdomen soft and nontender.  No hepatomegaly. Vascular: No leg edema. Neuro: Alert and oriented.  Motor strength 5/5.  Finger to nose intact.  Gait disturbance, he favors the right side with ambulation. Skin: Raised, mildly erythematous lesion right face (? Cyst). Port-A-Cath without erythema.   Lab Results:  Lab Results  Component Value Date   WBC 2.4 (L) 09/20/2017   HGB 14.5 08/09/2017   HCT 35.8 (L) 09/20/2017   MCV 87.6 09/20/2017   PLT 160 09/20/2017   NEUTROABS 0.9 (L) 09/20/2017    Imaging:  No results found.  Medications: I have reviewed the patient's current medications.  Assessment/Plan: 1. Adenocarcinoma of the distal esophagus-EGD 08/18/2016 confirmed a distal esophagus mass,  HER-2-negative ? Staging CTs 08/18/2016 with nonspecific pulmonary nodules, liver metastases, borderline thoracic and gastrohepatic ligament lymphadenopathy ? Cycle 1 FOLFOX 09/04/2016 ? Cycle 2 FOLFOX 09/18/2016 ? Cycle 3 FOLFOX 10/02/2016 ? Cycle 4 FOLFOX 10/16/2016 ? Cycle 5 FOLFOX 10/30/2016 ? Restaging CTs 11/10/2016-decreased wall thickness distal esophagus; stable borderline mediastinal lymphadenopathy; multiple liver metastases have completely resolved; stable scattered nonspecific tiny bilateral lung nodules ? Cycle 6 FOLFOX 11/13/2016 ? Cycle 7 FOLFOX 11/27/2016 ? Cycle 8 FOLFOX 12/11/2016 ? Cycle 9 FOLFOX 12/25/2016 ? Cycle 10 FOLFOX (oxaliplatin deleted) 01/08/2017 ? Initiation of maintenance therapy with 5-FU/leucovorin 02/06/2017 ? CTs 03/16/2017-stable pulmonary nodules, no esophagus mass, 1 subtle liver lesion ? Maintenance therapy continued with 5-FU/leucovorin every 3 weeks beginning 03/20/2017 ? CTs 06/29/2017-progressive liver metastases ? Cycle 1 Taxol/ramucirumab 07/12/2017, allergic reaction to Taxol ? Emergency room with right upper quadrant pain 07/14/2017-CT with enlargement of liver lesions ? Cycle 1 day 8 07/18/2017-Abraxane substituted for Taxol ? Cycle 2-day 1 Abraxane/ramucirumab 08/09/2017 ? CEA improved 08/09/2017 ? CEA improved 08/24/2017 ? Cycle 3-day 1 Abraxane/ramucirumab 09/06/2017; cycle 3-day 15 held due to neutropenia, poor performance status 2. Diabetes  3. COPD  4. Hypertension  5. History of colon polyps  6. Port-A-Cath placement 08/30/2016  7. 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  8. Right upper extremity deep vein thrombosis, Port-A-Cath related 08/24/2018started Xarelto 03/16/2017, possible subsegmental right lower lobe pulmonary embolus on CT 03/16/2017  9.Gross hematuria 05/24/2017-status post bladder irrigation in the emergency room, referred to urology  10.   Depression.  Trial of Remeron initiated 09/06/2017.  Disposition: [name] completed cycle 3-day 1 Taxol/ramucirumab 09/06/2017.  His performance status has declined over the past 2 weeks and he has neutropenia on labs today.  We are holding the cycle 3-day 15 treatment. He is scheduled for restaging CT scans on 09/27/2017.  We will add a brain CT due to the progressive balance problems.  He will return for follow-up as scheduled in 2 weeks.  He and his daughter understand to contact the office if current symptoms worsen or he develops new symptoms.  We reviewed neutropenic precautions.  They understand to contact the office with fever, chills, other signs of infection.  Patient seen with Dr. Benay Spice.  25 minutes were spent face-to-face at today's visit with the majority of that time involved in counseling/coordination of care.    Ned Card ANP/GNP-BC   09/20/2017  12:28 PM  This was a shared visit with Ned Card.  Justin [name] has developed balance difficulty and a diminished performance status.  We held chemotherapy today.  He will undergo restaging CTs to include a brain CT.  Julieanne Manson, MD

## 2017-09-20 NOTE — Progress Notes (Signed)
Pt flushed several times with no blood return. Cathflow released for RN to administer. Labs are being drawn by LAB1. Aidin Doane LPN

## 2017-09-20 NOTE — Progress Notes (Signed)
Port declotted, 85mL blood wasted prior to flushing and de-accessing.

## 2017-09-20 NOTE — Telephone Encounter (Signed)
No 2/28 los  °

## 2017-09-27 ENCOUNTER — Ambulatory Visit (HOSPITAL_COMMUNITY)
Admission: RE | Admit: 2017-09-27 | Discharge: 2017-09-27 | Disposition: A | Discharge status: Home / Self Care | Payer: Medicare Other | Source: Ambulatory Visit | Attending: Nurse Practitioner | Admitting: Nurse Practitioner

## 2017-09-27 ENCOUNTER — Telehealth: Payer: Self-pay | Admitting: *Deleted

## 2017-09-27 DIAGNOSIS — I2584 Coronary atherosclerosis due to calcified coronary lesion: Secondary | ICD-10-CM | POA: Insufficient documentation

## 2017-09-27 DIAGNOSIS — J439 Emphysema, unspecified: Secondary | ICD-10-CM | POA: Insufficient documentation

## 2017-09-27 DIAGNOSIS — C155 Malignant neoplasm of lower third of esophagus: Secondary | ICD-10-CM | POA: Insufficient documentation

## 2017-09-27 DIAGNOSIS — C159 Malignant neoplasm of esophagus, unspecified: Secondary | ICD-10-CM | POA: Diagnosis not present

## 2017-09-27 DIAGNOSIS — Z8501 Personal history of malignant neoplasm of esophagus: Secondary | ICD-10-CM | POA: Diagnosis not present

## 2017-09-27 DIAGNOSIS — H532 Diplopia: Secondary | ICD-10-CM | POA: Diagnosis not present

## 2017-09-27 DIAGNOSIS — I700 Atherosclerosis of aorta: Secondary | ICD-10-CM | POA: Insufficient documentation

## 2017-09-27 DIAGNOSIS — C787 Secondary malignant neoplasm of liver and intrahepatic bile duct: Secondary | ICD-10-CM | POA: Diagnosis not present

## 2017-09-27 DIAGNOSIS — I2510 Atherosclerotic heart disease of native coronary artery without angina pectoris: Secondary | ICD-10-CM | POA: Insufficient documentation

## 2017-09-27 MED ORDER — IOPAMIDOL (ISOVUE-300) INJECTION 61%
100.0000 mL | Freq: Once | INTRAVENOUS | Status: AC | PRN
  Administered 2017-09-27: 100 mL via INTRAVENOUS

## 2017-09-27 MED ORDER — SODIUM CHLORIDE 0.9 % IJ SOLN
INTRAMUSCULAR | Status: AC
  Filled 2017-09-27: qty 50

## 2017-09-27 MED ORDER — IOPAMIDOL (ISOVUE-300) INJECTION 61%
INTRAVENOUS | Status: AC
  Filled 2017-09-27: qty 100

## 2017-09-27 NOTE — Telephone Encounter (Signed)
Called pt with CT result per Dr. Benay Spice. CT shows stable disease. Call office for swelling in face, neck or arm. Pt and daughter voiced understanding. Pt reports he has occasional facial swelling that resolves with sinus medication. He will call for sudden/ increased swelling or pain. Dr. Benay Spice discussed CT with Interventional Radiologist, will discuss with pt next office visit.

## 2017-10-04 ENCOUNTER — Telehealth: Payer: Self-pay

## 2017-10-04 ENCOUNTER — Encounter: Payer: Self-pay | Admitting: Oncology

## 2017-10-04 ENCOUNTER — Inpatient Hospital Stay: Payer: Medicare Other

## 2017-10-04 ENCOUNTER — Inpatient Hospital Stay (HOSPITAL_BASED_OUTPATIENT_CLINIC_OR_DEPARTMENT_OTHER): Payer: Medicare Other | Admitting: Oncology

## 2017-10-04 ENCOUNTER — Inpatient Hospital Stay: Payer: Medicare Other | Attending: Oncology

## 2017-10-04 VITALS — BP 148/80 | HR 89 | Temp 97.5°F | Resp 17 | Ht 70.0 in | Wt 193.5 lb

## 2017-10-04 DIAGNOSIS — C787 Secondary malignant neoplasm of liver and intrahepatic bile duct: Secondary | ICD-10-CM

## 2017-10-04 DIAGNOSIS — Z86010 Personal history of colonic polyps: Secondary | ICD-10-CM | POA: Diagnosis not present

## 2017-10-04 DIAGNOSIS — F329 Major depressive disorder, single episode, unspecified: Secondary | ICD-10-CM | POA: Diagnosis not present

## 2017-10-04 DIAGNOSIS — Z95828 Presence of other vascular implants and grafts: Secondary | ICD-10-CM | POA: Insufficient documentation

## 2017-10-04 DIAGNOSIS — M791 Myalgia, unspecified site: Secondary | ICD-10-CM | POA: Diagnosis not present

## 2017-10-04 DIAGNOSIS — T859XXA Unspecified complication of internal prosthetic device, implant and graft, initial encounter: Secondary | ICD-10-CM

## 2017-10-04 DIAGNOSIS — R2689 Other abnormalities of gait and mobility: Secondary | ICD-10-CM

## 2017-10-04 DIAGNOSIS — Z86718 Personal history of other venous thrombosis and embolism: Secondary | ICD-10-CM | POA: Diagnosis not present

## 2017-10-04 DIAGNOSIS — I10 Essential (primary) hypertension: Secondary | ICD-10-CM | POA: Diagnosis not present

## 2017-10-04 DIAGNOSIS — Z5111 Encounter for antineoplastic chemotherapy: Secondary | ICD-10-CM | POA: Insufficient documentation

## 2017-10-04 DIAGNOSIS — C155 Malignant neoplasm of lower third of esophagus: Secondary | ICD-10-CM

## 2017-10-04 DIAGNOSIS — Z5112 Encounter for antineoplastic immunotherapy: Secondary | ICD-10-CM | POA: Diagnosis not present

## 2017-10-04 DIAGNOSIS — G629 Polyneuropathy, unspecified: Secondary | ICD-10-CM

## 2017-10-04 DIAGNOSIS — Z79899 Other long term (current) drug therapy: Secondary | ICD-10-CM | POA: Insufficient documentation

## 2017-10-04 DIAGNOSIS — J449 Chronic obstructive pulmonary disease, unspecified: Secondary | ICD-10-CM | POA: Diagnosis not present

## 2017-10-04 DIAGNOSIS — R600 Localized edema: Secondary | ICD-10-CM | POA: Insufficient documentation

## 2017-10-04 DIAGNOSIS — R200 Anesthesia of skin: Secondary | ICD-10-CM | POA: Diagnosis not present

## 2017-10-04 DIAGNOSIS — E119 Type 2 diabetes mellitus without complications: Secondary | ICD-10-CM | POA: Insufficient documentation

## 2017-10-04 LAB — CMP (CANCER CENTER ONLY)
ALT: 16 U/L (ref 0–55)
AST: 18 U/L (ref 5–34)
Albumin: 3.5 g/dL (ref 3.5–5.0)
Alkaline Phosphatase: 99 U/L (ref 40–150)
Anion gap: 10 (ref 3–11)
BUN: 9 mg/dL (ref 7–26)
CO2: 23 mmol/L (ref 22–29)
Calcium: 9.4 mg/dL (ref 8.4–10.4)
Chloride: 106 mmol/L (ref 98–109)
Creatinine: 0.77 mg/dL (ref 0.70–1.30)
GFR, Est AFR Am: >60 mL/min
GFR, Estimated: >60 mL/min
Glucose, Bld: 98 mg/dL (ref 70–140)
Potassium: 3.5 mmol/L (ref 3.5–5.1)
Sodium: 139 mmol/L (ref 136–145)
Total Bilirubin: 0.5 mg/dL (ref 0.2–1.2)
Total Protein: 7.2 g/dL (ref 6.4–8.3)

## 2017-10-04 LAB — CBC WITH DIFFERENTIAL (CANCER CENTER ONLY)
Basophils Absolute: 0.1 K/uL (ref 0.0–0.1)
Basophils Relative: 1 %
Eosinophils Absolute: 0.1 K/uL (ref 0.0–0.5)
Eosinophils Relative: 0 %
HCT: 41.2 % (ref 38.4–49.9)
Hemoglobin: 13.4 g/dL (ref 13.0–17.1)
Lymphocytes Relative: 14 %
Lymphs Abs: 1.8 K/uL (ref 0.9–3.3)
MCH: 28.6 pg (ref 27.2–33.4)
MCHC: 32.5 g/dL (ref 32.0–36.0)
MCV: 88 fL (ref 79.3–98.0)
Monocytes Absolute: 1.2 K/uL — ABNORMAL HIGH (ref 0.1–0.9)
Monocytes Relative: 9 %
Neutro Abs: 9.8 K/uL — ABNORMAL HIGH (ref 1.5–6.5)
Neutrophils Relative %: 76 %
Platelet Count: 188 K/uL (ref 140–400)
RBC: 4.68 MIL/uL (ref 4.20–5.82)
RDW: 18 % — ABNORMAL HIGH (ref 11.0–14.6)
WBC Count: 12.9 K/uL — ABNORMAL HIGH (ref 4.0–10.3)

## 2017-10-04 MED ORDER — SODIUM CHLORIDE 0.9 % IV SOLN
Freq: Once | INTRAVENOUS | Status: AC
  Administered 2017-10-04: 12:00:00 via INTRAVENOUS

## 2017-10-04 MED ORDER — DEXAMETHASONE SODIUM PHOSPHATE 10 MG/ML IJ SOLN
10.0000 mg | Freq: Once | INTRAMUSCULAR | Status: AC
  Administered 2017-10-04: 10 mg via INTRAVENOUS

## 2017-10-04 MED ORDER — PROCHLORPERAZINE MALEATE 10 MG PO TABS
ORAL_TABLET | ORAL | Status: AC
  Filled 2017-10-04: qty 1

## 2017-10-04 MED ORDER — SODIUM CHLORIDE 0.9% FLUSH
10.0000 mL | INTRAVENOUS | Status: DC | PRN
  Administered 2017-10-04: 10 mL via INTRAVENOUS
  Filled 2017-10-04: qty 10

## 2017-10-04 MED ORDER — ACETAMINOPHEN 325 MG PO TABS
650.0000 mg | ORAL_TABLET | Freq: Once | ORAL | Status: AC
  Administered 2017-10-04: 650 mg via ORAL

## 2017-10-04 MED ORDER — ACETAMINOPHEN 325 MG PO TABS
ORAL_TABLET | ORAL | Status: AC
  Filled 2017-10-04: qty 2

## 2017-10-04 MED ORDER — DEXAMETHASONE SODIUM PHOSPHATE 10 MG/ML IJ SOLN
INTRAMUSCULAR | Status: AC
  Filled 2017-10-04: qty 1

## 2017-10-04 MED ORDER — DIPHENHYDRAMINE HCL 50 MG/ML IJ SOLN
25.0000 mg | Freq: Once | INTRAMUSCULAR | Status: AC
  Administered 2017-10-04: 25 mg via INTRAVENOUS

## 2017-10-04 MED ORDER — PROCHLORPERAZINE MALEATE 10 MG PO TABS
10.0000 mg | ORAL_TABLET | Freq: Once | ORAL | Status: AC
  Administered 2017-10-04: 10 mg via ORAL

## 2017-10-04 MED ORDER — PACLITAXEL PROTEIN-BOUND CHEMO INJECTION 100 MG
98.0000 mg/m2 | Freq: Once | INTRAVENOUS | Status: AC
  Administered 2017-10-04: 200 mg via INTRAVENOUS
  Filled 2017-10-04: qty 40

## 2017-10-04 MED ORDER — ALTEPLASE 2 MG IJ SOLR
INTRAMUSCULAR | Status: AC
  Filled 2017-10-04: qty 2

## 2017-10-04 MED ORDER — SODIUM CHLORIDE 0.9% FLUSH
10.0000 mL | INTRAVENOUS | Status: DC | PRN
  Administered 2017-10-04: 10 mL
  Filled 2017-10-04: qty 10

## 2017-10-04 MED ORDER — SODIUM CHLORIDE 0.9 % IV SOLN
8.0000 mg/kg | Freq: Once | INTRAVENOUS | Status: AC
  Administered 2017-10-04: 700 mg via INTRAVENOUS
  Filled 2017-10-04: qty 50

## 2017-10-04 MED ORDER — DIPHENHYDRAMINE HCL 50 MG/ML IJ SOLN
INTRAMUSCULAR | Status: AC
  Filled 2017-10-04: qty 1

## 2017-10-04 MED ORDER — ALTEPLASE 2 MG IJ SOLR
2.0000 mg | Freq: Once | INTRAMUSCULAR | Status: DC | PRN
  Administered 2017-10-04: 2 mg
  Filled 2017-10-04: qty 2

## 2017-10-04 MED ORDER — HEPARIN SOD (PORK) LOCK FLUSH 100 UNIT/ML IV SOLN
500.0000 [IU] | Freq: Once | INTRAVENOUS | Status: AC | PRN
  Administered 2017-10-04: 500 [IU]
  Filled 2017-10-04: qty 5

## 2017-10-04 NOTE — Patient Instructions (Signed)
[facility] Discharge Instructions for Patients Receiving Chemotherapy  Today you received the following chemotherapy agents Abraxane and Cyramza.  To help prevent nausea and vomiting after your treatment, we encourage you to take your nausea medication as directed.  If you develop nausea and vomiting that is not controlled by your nausea medication, call the clinic.   BELOW ARE SYMPTOMS THAT SHOULD BE REPORTED IMMEDIATELY:  *FEVER GREATER THAN 100.5 F  *CHILLS WITH OR WITHOUT FEVER  NAUSEA AND VOMITING THAT IS NOT CONTROLLED WITH YOUR NAUSEA MEDICATION  *UNUSUAL SHORTNESS OF BREATH  *UNUSUAL BRUISING OR BLEEDING  TENDERNESS IN MOUTH AND THROAT WITH OR WITHOUT PRESENCE OF ULCERS  *URINARY PROBLEMS  *BOWEL PROBLEMS  UNUSUAL RASH Items with * indicate a potential emergency and should be followed up as soon as possible.  Feel free to call the clinic should you have any questions or concerns. The clinic phone number is [phone].  Please show the [place] at check-in to the Emergency Department and triage nurse.

## 2017-10-04 NOTE — Telephone Encounter (Signed)
Printed avs and calender of upcoming appointment. Per 3/14 los 

## 2017-10-04 NOTE — Progress Notes (Signed)
No blood return from port after several flushes and repositioning. Labs were drawn peripherally by Lab 2. Dr. Benay Spice desk nurse called and CATHFLOW will be administered. Porsche Cates LPN

## 2017-10-04 NOTE — Progress Notes (Signed)
[facility] OFFICE PROGRESS NOTE   Diagnosis: Esophagus cancer  INTERVAL HISTORY:   Mr. Zurcher returns as scheduled.  He was last treated with Abraxane on 09/13/2017.  Treatment was held on 09/20/2017 secondary to neutropenia.  Mr. Flemister reports numbness in the feet.  He thinks this may be responsible for his balance difficulty.  No fall.  He has noted swelling at the left ankle.  No face or arm swelling.  Objective:  Vital signs in last 24 hours:  Blood pressure (!) 148/80, pulse 89, temperature (!) 97.5 F (36.4 C), temperature source Oral, resp. rate 17, height 5' 10"  (1.778 m), weight 193 lb 8 oz (87.8 kg), SpO2 97 %.    HEENT: Neck without mass Lymphatics: No cervical or supraclavicular nodes Resp: Lungs clear bilaterally Cardio: Regular rate and rhythm GI: No hepatomegaly Vascular: Trace edema at the left ankle, the left leg is slightly larger than the right side.  No erythema or tenderness Neuro: Moderate loss of vibratory sense at the fingertips bilaterally    Portacath/PICC-without erythema  Lab Results:  Lab Results  Component Value Date   WBC 12.9 (H) 10/04/2017   HGB 14.5 08/09/2017   HCT 41.2 10/04/2017   MCV 88.0 10/04/2017   PLT 188 10/04/2017   NEUTROABS 9.8 (H) 10/04/2017    CMP     Component Value Date/Time   NA 139 10/04/2017 0859   NA 138 07/26/2017 1335   K 3.5 10/04/2017 0859   K 3.9 07/26/2017 1335   CL 106 10/04/2017 0859   CO2 23 10/04/2017 0859   CO2 24 07/26/2017 1335   GLUCOSE 98 10/04/2017 0859   GLUCOSE 129 07/26/2017 1335   BUN 9 10/04/2017 0859   BUN 10.7 07/26/2017 1335   CREATININE 0.77 10/04/2017 0859   CREATININE 0.9 07/26/2017 1335   CALCIUM 9.4 10/04/2017 0859   CALCIUM 9.2 07/26/2017 1335   PROT 7.2 10/04/2017 0859   PROT 7.1 07/26/2017 1335   ALBUMIN 3.5 10/04/2017 0859   ALBUMIN 3.4 (L) 07/26/2017 1335   AST 18 10/04/2017 0859   AST 18 07/26/2017 1335   ALT 16 10/04/2017 0859   ALT 26  07/26/2017 1335   ALKPHOS 99 10/04/2017 0859   ALKPHOS 136 07/26/2017 1335   BILITOT 0.5 10/04/2017 0859   BILITOT 0.31 07/26/2017 1335   GFRNONAA >60 10/04/2017 0859   GFRAA >60 10/04/2017 0859    Lab Results  Component Value Date   CEA1 15.82 (H) 09/20/2017     Imaging:  No results found.  Medications: I have reviewed the patient's current medications.   Assessment/Plan: 1. 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 Staging CTs 08/18/2016 with nonspecific pulmonary nodules, liver metastases, borderline thoracic and gastrohepatic ligament lymphadenopathy ? Cycle 1 FOLFOX 09/04/2016 ? Cycle 2 FOLFOX 09/18/2016 ? Cycle 3 FOLFOX 10/02/2016 ? Cycle 4 FOLFOX 10/16/2016 ? Cycle 5 FOLFOX 10/30/2016 ? Restaging CTs 11/10/2016-decreased wall thickness distal esophagus; stable borderline mediastinal lymphadenopathy; multiple liver metastases have completely resolved; stable scattered nonspecific tiny bilateral lung nodules ? Cycle 6 FOLFOX 11/13/2016 ? Cycle 7 FOLFOX 11/27/2016 ? Cycle 8 FOLFOX 12/11/2016 ? Cycle 9 FOLFOX 12/25/2016 ? Cycle 10 FOLFOX (oxaliplatin deleted) 01/08/2017 ? Initiation of maintenance therapy with 5-FU/leucovorin 02/06/2017 ? CTs 03/16/2017-stable pulmonary nodules, no esophagus mass, 1 subtle liver lesion ? Maintenance therapy continued with 5-FU/leucovorin every 3 weeks beginning 03/20/2017 ? CTs 06/29/2017-progressive liver metastases ? Cycle 1 Taxol/ramucirumab 07/12/2017, allergic reaction to Taxol ? Emergency  room with right upper quadrant pain 07/14/2017-CTwithenlargement of liver lesions ? Cycle 1 day 8 07/18/2017-Abraxane substituted for Taxol ? Cycle 2-day 1 Abraxane/ramucirumab 08/09/2017 ? CEA improved 08/09/2017 ? CEA improved 08/24/2017 ? Cycle 3-day 1 Abraxane/ramucirumab 09/06/2017; cycle 3-day 15 held due to neutropenia, poor performance status ? CTs 09/27/2017-slight decrease in  right supraclavicular lymph nodes, mild increase in size of central right liver lesion, other liver lesions stable, stable small pulmonary nodules, marked narrowing of the SVC with thrombus around the Port-A-Cath ? Gemcitabine/Abraxane continued on a 2-week schedule 2. Diabetes  3. COPD  4. Hypertension  5. History of colon polyps  6. Port-A-Cath placement 08/30/2016  7. 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  8. Right upper extremity deep vein thrombosis, Port-A-Cath related 08/24/2018started Xarelto 03/16/2017, possible subsegmental right lower lobe pulmonary embolus on CT 03/16/2017  CT 09/27/2017- narrowing of the SVC with thrombus around the Port-A-Cath, extensive chest wall collaterals  9.Gross hematuria 05/24/2017-status post bladder irrigation in the emergency room, referred to urology  10.Depression. Trial of Remeron initiated 09/06/2017.   Disposition: Mr. Feldpausch appears stable.  The brain CT was negative for evidence of metastatic disease.  The balance difficulty be related to neuropathy.  The neuropathy may be secondary to oxaliplatin.  The CEA is lower and the restaging CT reveals no clear evidence of disease progression.  The plan is to continue every 2-week Abraxane/ramucirumab beginning today.  We will monitor the neuropathy symptoms and discontinue Abraxane for progression.  He has a Port-A-Cath related upper extremity/central venous thrombosis.  The Port-A-Cath did not initially return blood today.  He will receive thrombolysis treatment at the Cancer center today in an attempt to obtain blood return.  He will continue Xarelto anticoagulation.  He does not appear to have symptoms of SVC syndrome.  25 minutes were spent with the patient today.  The majority of the time was used for counseling and coordination of care.  CT images were reviewed.  Betsy Coder, MD  10/04/2017  10:38 AM

## 2017-10-10 ENCOUNTER — Telehealth: Payer: Self-pay

## 2017-10-10 NOTE — Telephone Encounter (Addendum)
Call from [facility] in scheduling stating that pt has a request regarding appt for tomorrow. Spoke with pt daughter stating that " Dr. Benay Spice cancelled those appts when we were in last week, we will be coming every other week now". Will consult MD. Per MD appts to be cancelled.

## 2017-10-11 ENCOUNTER — Ambulatory Visit: Payer: Medicare Other

## 2017-10-11 ENCOUNTER — Other Ambulatory Visit: Payer: Medicare Other

## 2017-10-13 ENCOUNTER — Other Ambulatory Visit: Payer: Self-pay | Admitting: Oncology

## 2017-10-18 ENCOUNTER — Telehealth: Payer: Self-pay

## 2017-10-18 ENCOUNTER — Inpatient Hospital Stay: Payer: Medicare Other

## 2017-10-18 ENCOUNTER — Inpatient Hospital Stay (HOSPITAL_BASED_OUTPATIENT_CLINIC_OR_DEPARTMENT_OTHER): Payer: Medicare Other | Admitting: Oncology

## 2017-10-18 ENCOUNTER — Telehealth: Payer: Self-pay | Admitting: Oncology

## 2017-10-18 VITALS — BP 151/68 | HR 91 | Temp 97.4°F | Resp 17 | Ht 70.0 in | Wt 190.4 lb

## 2017-10-18 DIAGNOSIS — M791 Myalgia, unspecified site: Secondary | ICD-10-CM

## 2017-10-18 DIAGNOSIS — Z5112 Encounter for antineoplastic immunotherapy: Secondary | ICD-10-CM | POA: Diagnosis not present

## 2017-10-18 DIAGNOSIS — Z5111 Encounter for antineoplastic chemotherapy: Secondary | ICD-10-CM | POA: Diagnosis not present

## 2017-10-18 DIAGNOSIS — Z95828 Presence of other vascular implants and grafts: Secondary | ICD-10-CM

## 2017-10-18 DIAGNOSIS — I10 Essential (primary) hypertension: Secondary | ICD-10-CM | POA: Diagnosis not present

## 2017-10-18 DIAGNOSIS — E119 Type 2 diabetes mellitus without complications: Secondary | ICD-10-CM | POA: Diagnosis not present

## 2017-10-18 DIAGNOSIS — Z86010 Personal history of colonic polyps: Secondary | ICD-10-CM

## 2017-10-18 DIAGNOSIS — J449 Chronic obstructive pulmonary disease, unspecified: Secondary | ICD-10-CM

## 2017-10-18 DIAGNOSIS — C155 Malignant neoplasm of lower third of esophagus: Secondary | ICD-10-CM

## 2017-10-18 DIAGNOSIS — Z86718 Personal history of other venous thrombosis and embolism: Secondary | ICD-10-CM

## 2017-10-18 DIAGNOSIS — R2689 Other abnormalities of gait and mobility: Secondary | ICD-10-CM

## 2017-10-18 DIAGNOSIS — F329 Major depressive disorder, single episode, unspecified: Secondary | ICD-10-CM | POA: Diagnosis not present

## 2017-10-18 DIAGNOSIS — R600 Localized edema: Secondary | ICD-10-CM

## 2017-10-18 DIAGNOSIS — R200 Anesthesia of skin: Secondary | ICD-10-CM | POA: Diagnosis not present

## 2017-10-18 DIAGNOSIS — Z79899 Other long term (current) drug therapy: Secondary | ICD-10-CM

## 2017-10-18 LAB — CBC WITH DIFFERENTIAL (CANCER CENTER ONLY)
BASOS PCT: 1 %
Basophils Absolute: 0.1 10*3/uL (ref 0.0–0.1)
EOS PCT: 2 %
Eosinophils Absolute: 0.1 10*3/uL (ref 0.0–0.5)
HEMATOCRIT: 38.6 % (ref 38.4–49.9)
Hemoglobin: 12.7 g/dL — ABNORMAL LOW (ref 13.0–17.1)
Lymphocytes Relative: 22 %
Lymphs Abs: 1.5 10*3/uL (ref 0.9–3.3)
MCH: 29.1 pg (ref 27.2–33.4)
MCHC: 32.9 g/dL (ref 32.0–36.0)
MCV: 88.4 fL (ref 79.3–98.0)
MONO ABS: 1.2 10*3/uL — AB (ref 0.1–0.9)
MONOS PCT: 18 %
NEUTROS ABS: 3.9 10*3/uL (ref 1.5–6.5)
Neutrophils Relative %: 57 %
PLATELETS: 162 10*3/uL (ref 140–400)
RBC: 4.37 MIL/uL (ref 4.20–5.82)
RDW: 18 % — AB (ref 11.0–14.6)
WBC Count: 6.7 10*3/uL (ref 4.0–10.3)

## 2017-10-18 LAB — CMP (CANCER CENTER ONLY)
ALBUMIN: 3.5 g/dL (ref 3.5–5.0)
ALK PHOS: 109 U/L (ref 40–150)
ALT: 19 U/L (ref 0–55)
ANION GAP: 7 (ref 3–11)
AST: 21 U/L (ref 5–34)
BUN: 9 mg/dL (ref 7–26)
CALCIUM: 9.2 mg/dL (ref 8.4–10.4)
CO2: 24 mmol/L (ref 22–29)
Chloride: 107 mmol/L (ref 98–109)
Creatinine: 0.75 mg/dL (ref 0.70–1.30)
GFR, Est AFR Am: >60 mL/min (ref >60)
GFR, Estimated: >60 mL/min (ref >60)
GLUCOSE: 89 mg/dL (ref 70–140)
Potassium: 3.6 mmol/L (ref 3.5–5.1)
SODIUM: 138 mmol/L (ref 136–145)
Total Bilirubin: 0.5 mg/dL (ref 0.2–1.2)
Total Protein: 7.1 g/dL (ref 6.4–8.3)

## 2017-10-18 LAB — CEA (IN HOUSE-CHCC): CEA (CHCC-IN HOUSE): 21.67 ng/mL — AB (ref 0.00–5.00)

## 2017-10-18 MED ORDER — ACETAMINOPHEN 325 MG PO TABS
ORAL_TABLET | ORAL | Status: AC
  Filled 2017-10-18: qty 2

## 2017-10-18 MED ORDER — ALTEPLASE 2 MG IJ SOLR
2.0000 mg | Freq: Once | INTRAMUSCULAR | Status: DC | PRN
  Filled 2017-10-18: qty 2

## 2017-10-18 MED ORDER — DEXAMETHASONE SODIUM PHOSPHATE 10 MG/ML IJ SOLN
10.0000 mg | Freq: Once | INTRAMUSCULAR | Status: AC
  Administered 2017-10-18: 10 mg via INTRAVENOUS

## 2017-10-18 MED ORDER — PROCHLORPERAZINE MALEATE 10 MG PO TABS
ORAL_TABLET | ORAL | Status: AC
  Filled 2017-10-18: qty 1

## 2017-10-18 MED ORDER — SODIUM CHLORIDE 0.9 % IV SOLN
Freq: Once | INTRAVENOUS | Status: AC
  Administered 2017-10-18: 10:00:00 via INTRAVENOUS

## 2017-10-18 MED ORDER — PACLITAXEL PROTEIN-BOUND CHEMO INJECTION 100 MG
200.0000 mg | Freq: Once | INTRAVENOUS | Status: AC
  Administered 2017-10-18: 200 mg via INTRAVENOUS
  Filled 2017-10-18: qty 40

## 2017-10-18 MED ORDER — ALTEPLASE 2 MG IJ SOLR
2.0000 mg | Freq: Once | INTRAMUSCULAR | Status: AC | PRN
  Administered 2017-10-18: 2 mg
  Filled 2017-10-18: qty 2

## 2017-10-18 MED ORDER — DIPHENHYDRAMINE HCL 50 MG/ML IJ SOLN
INTRAMUSCULAR | Status: AC
  Filled 2017-10-18: qty 1

## 2017-10-18 MED ORDER — ALTEPLASE 2 MG IJ SOLR
INTRAMUSCULAR | Status: AC
  Filled 2017-10-18: qty 2

## 2017-10-18 MED ORDER — SODIUM CHLORIDE 0.9% FLUSH
10.0000 mL | INTRAVENOUS | Status: DC | PRN
  Administered 2017-10-18: 10 mL
  Filled 2017-10-18: qty 10

## 2017-10-18 MED ORDER — DEXAMETHASONE SODIUM PHOSPHATE 10 MG/ML IJ SOLN
INTRAMUSCULAR | Status: AC
  Filled 2017-10-18: qty 1

## 2017-10-18 MED ORDER — PROCHLORPERAZINE MALEATE 10 MG PO TABS
10.0000 mg | ORAL_TABLET | Freq: Once | ORAL | Status: AC
  Administered 2017-10-18: 10 mg via ORAL

## 2017-10-18 MED ORDER — SODIUM CHLORIDE 0.9% FLUSH
10.0000 mL | INTRAVENOUS | Status: DC | PRN
  Administered 2017-10-18: 10 mL via INTRAVENOUS
  Filled 2017-10-18: qty 10

## 2017-10-18 MED ORDER — HYDROCODONE-ACETAMINOPHEN 5-325 MG PO TABS: 1.0000 | ORAL_TABLET | ORAL | 0 refills | Status: DC | PRN

## 2017-10-18 MED ORDER — DIPHENHYDRAMINE HCL 50 MG/ML IJ SOLN
25.0000 mg | Freq: Once | INTRAMUSCULAR | Status: AC
  Administered 2017-10-18: 25 mg via INTRAVENOUS

## 2017-10-18 MED ORDER — ACETAMINOPHEN 325 MG PO TABS
650.0000 mg | ORAL_TABLET | Freq: Once | ORAL | Status: AC
  Administered 2017-10-18: 650 mg via ORAL

## 2017-10-18 MED ORDER — SODIUM CHLORIDE 0.9 % IV SOLN
8.0000 mg/kg | Freq: Once | INTRAVENOUS | Status: AC
  Administered 2017-10-18: 700 mg via INTRAVENOUS
  Filled 2017-10-18: qty 50

## 2017-10-18 MED ORDER — HEPARIN SOD (PORK) LOCK FLUSH 100 UNIT/ML IV SOLN
500.0000 [IU] | Freq: Once | INTRAVENOUS | Status: AC | PRN
  Administered 2017-10-18: 500 [IU]
  Filled 2017-10-18: qty 5

## 2017-10-18 NOTE — Addendum Note (Signed)
Addended by: Mathis Fare on: 10/18/2017 09:39 AM   Modules accepted: Orders

## 2017-10-18 NOTE — Telephone Encounter (Signed)
Scheduled appt per 3/28 los - patient to get an updated schedule in the treatment area.  

## 2017-10-18 NOTE — Progress Notes (Signed)
[name] OFFICE PROGRESS NOTE   Diagnosis: Esophagus cancer  INTERVAL HISTORY:   Justin [name] [name] returns as scheduled.  He completed another treatment with Taxol/ramucirumab 10/04/2017.  He reports tolerating the treatment well.  No change in neuropathy symptoms.  Good appetite.  He has intermittent aches and pains, chiefly at the right upper abdomen, not relieved with Tylenol.  His balance is improving.  Objective:  Vital signs in last 24 hours:  Blood pressure (!) 151/68, pulse 91, temperature (!) 97.4 F (36.3 C), temperature source Oral, resp. rate 17, height 5' 10"  (1.778 m), weight 190 lb 6.4 oz (86.4 kg), SpO2 97 %.    HEENT: No thrush or ulcers Resp: Scattered rhonchi, no respiratory distress Cardio: Regular rate and rhythm GI: No hepatomegaly, nontender Vascular: No leg edema    Portacath/PICC-without erythema  Lab Results:  Lab Results  Component Value Date   WBC 6.7 10/18/2017   HGB 14.5 08/09/2017   HCT 38.6 10/18/2017   MCV 88.4 10/18/2017   PLT 162 10/18/2017   NEUTROABS 3.9 10/18/2017    CMP     Component Value Date/Time   NA 139 10/04/2017 0859   NA 138 07/26/2017 1335   K 3.5 10/04/2017 0859   K 3.9 07/26/2017 1335   CL 106 10/04/2017 0859   CO2 23 10/04/2017 0859   CO2 24 07/26/2017 1335   GLUCOSE 98 10/04/2017 0859   GLUCOSE 129 07/26/2017 1335   BUN 9 10/04/2017 0859   BUN 10.7 07/26/2017 1335   CREATININE 0.77 10/04/2017 0859   CREATININE 0.9 07/26/2017 1335   CALCIUM 9.4 10/04/2017 0859   CALCIUM 9.2 07/26/2017 1335   PROT 7.2 10/04/2017 0859   PROT 7.1 07/26/2017 1335   ALBUMIN 3.5 10/04/2017 0859   ALBUMIN 3.4 (L) 07/26/2017 1335   AST 18 10/04/2017 0859   AST 18 07/26/2017 1335   ALT 16 10/04/2017 0859   ALT 26 07/26/2017 1335   ALKPHOS 99 10/04/2017 0859   ALKPHOS 136 07/26/2017 1335   BILITOT 0.5 10/04/2017 0859   BILITOT 0.31 07/26/2017 1335   GFRNONAA >60 10/04/2017 0859   GFRAA >60 10/04/2017 0859      Medications: I have reviewed the patient's current medications.   Assessment/Plan: 1. 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 Staging CTs 08/18/2016 with nonspecific pulmonary nodules, liver metastases, borderline thoracic and gastrohepatic ligament lymphadenopathy ? Cycle 1 FOLFOX 09/04/2016 ? Cycle 2 FOLFOX 09/18/2016 ? Cycle 3 FOLFOX 10/02/2016 ? Cycle 4 FOLFOX 10/16/2016 ? Cycle 5 FOLFOX 10/30/2016 ? Restaging CTs 11/10/2016-decreased wall thickness distal esophagus; stable borderline mediastinal lymphadenopathy; multiple liver metastases have completely resolved; stable scattered nonspecific tiny bilateral lung nodules ? Cycle 6 FOLFOX 11/13/2016 ? Cycle 7 FOLFOX 11/27/2016 ? Cycle 8 FOLFOX 12/11/2016 ? Cycle 9 FOLFOX 12/25/2016 ? Cycle 10 FOLFOX (oxaliplatin deleted) 01/08/2017 ? Initiation of maintenance therapy with 5-FU/leucovorin 02/06/2017 ? CTs 03/16/2017-stable pulmonary nodules, no esophagus mass, 1 subtle liver lesion ? Maintenance therapy continued with 5-FU/leucovorin every 3 weeks beginning 03/20/2017 ? CTs 06/29/2017-progressive liver metastases ? Cycle 1 Taxol/ramucirumab 07/12/2017, allergic reaction to Taxol ? Emergency room with right upper quadrant pain 07/14/2017-CTwithenlargement of liver lesions ? Cycle 1 day 8 07/18/2017-Abraxane substituted for Taxol ? Cycle 2-day 1 Abraxane/ramucirumab 08/09/2017 ? CEA improved 08/09/2017 ? CEA improved2/07/2017 ? Cycle 3-day 1 Abraxane/ramucirumab 09/06/2017;cycle 3-day 15 held due to neutropenia, poor performance status ? CTs 09/27/2017-slight decrease in right supraclavicular lymph nodes, mild increase in size of central  right liver lesion, other liver lesions stable, stable small pulmonary nodules, marked narrowing of the SVC with thrombus around the Port-A-Cath ? Gemcitabine/Abraxane continued on a 2-week schedule 2. Diabetes  3. COPD  4.  Hypertension  5. History of colon polyps  6. Port-A-Cath placement 08/30/2016  7. 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  8. Right upper extremity deep vein thrombosis, Port-A-Cath related 08/24/2018started Xarelto 03/16/2017, possible subsegmental right lower lobe pulmonary embolus on CT 03/16/2017  CT 09/27/2017- narrowing of the SVC with thrombus around the Port-A-Cath, extensive chest wall collaterals  9.Gross hematuria 05/24/2017-status post bladder irrigation in the emergency room, referred to urology  10.Depression. Trial of Remeron initiated 09/06/2017.   Disposition: Justin [name] [name] appears unchanged.  He will complete another treatment with Taxol/ramucirumab today.  We will follow-up on the CEA from today.  He will return for an office visit and chemotherapy in 2 weeks.  15 minutes were spent with the patient today.  The majority of the time was used for counseling and coordination of care.  Betsy Coder, MD  10/18/2017  9:11 AM

## 2017-10-18 NOTE — Telephone Encounter (Signed)
Completed by MX, I printed avs and calender for patient per 3/28 los

## 2017-10-18 NOTE — Patient Instructions (Signed)
[name] Discharge Instructions for Patients Receiving Chemotherapy  Today you received the following chemotherapy agents Abraxane, Cyramza  To help prevent nausea and vomiting after your treatment, we encourage you to take your nausea medication as directed   If you develop nausea and vomiting that is not controlled by your nausea medication, call the clinic.   BELOW ARE SYMPTOMS THAT SHOULD BE REPORTED IMMEDIATELY:  *FEVER GREATER THAN 100.5 F  *CHILLS WITH OR WITHOUT FEVER  NAUSEA AND VOMITING THAT IS NOT CONTROLLED WITH YOUR NAUSEA MEDICATION  *UNUSUAL SHORTNESS OF BREATH  *UNUSUAL BRUISING OR BLEEDING  TENDERNESS IN MOUTH AND THROAT WITH OR WITHOUT PRESENCE OF ULCERS  *URINARY PROBLEMS  *BOWEL PROBLEMS  UNUSUAL RASH Items with * indicate a potential emergency and should be followed up as soon as possible.  Feel free to call the clinic should you have any questions or concerns. The clinic phone number is [phone].  Please show the [facility] at check-in to the Emergency Department and triage nurse.

## 2017-10-18 NOTE — Progress Notes (Signed)
Port flushed several times with no blood return. Labs are being drawn peripherally by Lab 1. Dr. Benay Spice desk nurse called and CATHFLOW will be administered by an RN. Porsche Cates LPN

## 2017-10-28 ENCOUNTER — Other Ambulatory Visit: Payer: Self-pay | Admitting: Oncology

## 2017-11-01 ENCOUNTER — Encounter: Payer: Self-pay | Admitting: Nurse Practitioner

## 2017-11-01 ENCOUNTER — Inpatient Hospital Stay: Payer: Medicare Other

## 2017-11-01 ENCOUNTER — Telehealth: Payer: Self-pay | Admitting: Nurse Practitioner

## 2017-11-01 ENCOUNTER — Ambulatory Visit: Payer: Medicare Other | Admitting: Family Medicine

## 2017-11-01 ENCOUNTER — Inpatient Hospital Stay: Payer: Medicare Other | Attending: Oncology | Admitting: Nurse Practitioner

## 2017-11-01 VITALS — BP 155/77 | HR 98 | Temp 97.1°F | Resp 18 | Ht 70.0 in | Wt 192.0 lb

## 2017-11-01 DIAGNOSIS — R600 Localized edema: Secondary | ICD-10-CM | POA: Diagnosis not present

## 2017-11-01 DIAGNOSIS — Z5111 Encounter for antineoplastic chemotherapy: Secondary | ICD-10-CM | POA: Insufficient documentation

## 2017-11-01 DIAGNOSIS — C155 Malignant neoplasm of lower third of esophagus: Secondary | ICD-10-CM | POA: Diagnosis not present

## 2017-11-01 DIAGNOSIS — F329 Major depressive disorder, single episode, unspecified: Secondary | ICD-10-CM | POA: Diagnosis not present

## 2017-11-01 DIAGNOSIS — I10 Essential (primary) hypertension: Secondary | ICD-10-CM | POA: Insufficient documentation

## 2017-11-01 DIAGNOSIS — C787 Secondary malignant neoplasm of liver and intrahepatic bile duct: Secondary | ICD-10-CM | POA: Diagnosis not present

## 2017-11-01 DIAGNOSIS — E119 Type 2 diabetes mellitus without complications: Secondary | ICD-10-CM | POA: Diagnosis not present

## 2017-11-01 DIAGNOSIS — Z5112 Encounter for antineoplastic immunotherapy: Secondary | ICD-10-CM | POA: Insufficient documentation

## 2017-11-01 DIAGNOSIS — Z86718 Personal history of other venous thrombosis and embolism: Secondary | ICD-10-CM | POA: Diagnosis not present

## 2017-11-01 DIAGNOSIS — Z86010 Personal history of colonic polyps: Secondary | ICD-10-CM | POA: Diagnosis not present

## 2017-11-01 DIAGNOSIS — Z95828 Presence of other vascular implants and grafts: Secondary | ICD-10-CM

## 2017-11-01 DIAGNOSIS — J449 Chronic obstructive pulmonary disease, unspecified: Secondary | ICD-10-CM | POA: Insufficient documentation

## 2017-11-01 LAB — CMP (CANCER CENTER ONLY)
ALK PHOS: 113 U/L (ref 40–150)
ALT: 16 U/L (ref 0–55)
ANION GAP: 7 (ref 3–11)
AST: 20 U/L (ref 5–34)
Albumin: 3.4 g/dL — ABNORMAL LOW (ref 3.5–5.0)
BUN: 8 mg/dL (ref 7–26)
CALCIUM: 9.3 mg/dL (ref 8.4–10.4)
CHLORIDE: 107 mmol/L (ref 98–109)
CO2: 24 mmol/L (ref 22–29)
Creatinine: 0.74 mg/dL (ref 0.70–1.30)
GFR, Estimated: >60 mL/min (ref >60)
Glucose, Bld: 123 mg/dL (ref 70–140)
Potassium: 3.9 mmol/L (ref 3.5–5.1)
SODIUM: 138 mmol/L (ref 136–145)
Total Bilirubin: 0.4 mg/dL (ref 0.2–1.2)
Total Protein: 6.9 g/dL (ref 6.4–8.3)

## 2017-11-01 LAB — CBC WITH DIFFERENTIAL (CANCER CENTER ONLY)
Basophils Absolute: 0 10*3/uL (ref 0.0–0.1)
Basophils Relative: 0 %
EOS ABS: 0.1 10*3/uL (ref 0.0–0.5)
EOS PCT: 1 %
HCT: 38.3 % — ABNORMAL LOW (ref 38.4–49.9)
Hemoglobin: 12.3 g/dL — ABNORMAL LOW (ref 13.0–17.1)
LYMPHS ABS: 1.7 10*3/uL (ref 0.9–3.3)
Lymphocytes Relative: 26 %
MCH: 28.6 pg (ref 27.2–33.4)
MCHC: 32.1 g/dL (ref 32.0–36.0)
MCV: 89.1 fL (ref 79.3–98.0)
Monocytes Absolute: 1.2 10*3/uL — ABNORMAL HIGH (ref 0.1–0.9)
Monocytes Relative: 18 %
Neutro Abs: 3.7 10*3/uL (ref 1.5–6.5)
Neutrophils Relative %: 55 %
PLATELETS: 133 10*3/uL — AB (ref 140–400)
RBC: 4.3 MIL/uL (ref 4.20–5.82)
RDW: 17.9 % — ABNORMAL HIGH (ref 11.0–14.6)
WBC Count: 6.7 10*3/uL (ref 4.0–10.3)

## 2017-11-01 MED ORDER — PROCHLORPERAZINE MALEATE 10 MG PO TABS
ORAL_TABLET | ORAL | Status: AC
  Filled 2017-11-01: qty 1

## 2017-11-01 MED ORDER — SODIUM CHLORIDE 0.9% FLUSH
10.0000 mL | INTRAVENOUS | Status: DC | PRN
  Administered 2017-11-01: 10 mL via INTRAVENOUS
  Filled 2017-11-01: qty 10

## 2017-11-01 MED ORDER — SODIUM CHLORIDE 0.9 % IV SOLN
Freq: Once | INTRAVENOUS | Status: AC
  Administered 2017-11-01: 11:00:00 via INTRAVENOUS

## 2017-11-01 MED ORDER — SODIUM CHLORIDE 0.9% FLUSH
10.0000 mL | INTRAVENOUS | Status: DC | PRN
  Administered 2017-11-01: 10 mL
  Filled 2017-11-01: qty 10

## 2017-11-01 MED ORDER — HEPARIN SOD (PORK) LOCK FLUSH 100 UNIT/ML IV SOLN
500.0000 [IU] | Freq: Once | INTRAVENOUS | Status: AC | PRN
  Administered 2017-11-01: 500 [IU]
  Filled 2017-11-01: qty 5

## 2017-11-01 MED ORDER — PROCHLORPERAZINE MALEATE 10 MG PO TABS
10.0000 mg | ORAL_TABLET | Freq: Once | ORAL | Status: AC
  Administered 2017-11-01: 10 mg via ORAL

## 2017-11-01 MED ORDER — RIVAROXABAN 20 MG PO TABS: 20.0000 mg | ORAL_TABLET | Freq: Every day | ORAL | 5 refills | Status: DC

## 2017-11-01 MED ORDER — DIPHENHYDRAMINE HCL 50 MG/ML IJ SOLN
25.0000 mg | Freq: Once | INTRAMUSCULAR | Status: AC
  Administered 2017-11-01: 25 mg via INTRAVENOUS

## 2017-11-01 MED ORDER — RAMUCIRUMAB CHEMO INJECTION 500 MG/50ML
8.0000 mg/kg | Freq: Once | INTRAVENOUS | Status: AC
  Administered 2017-11-01: 700 mg via INTRAVENOUS
  Filled 2017-11-01: qty 50

## 2017-11-01 MED ORDER — DIPHENHYDRAMINE HCL 50 MG/ML IJ SOLN
INTRAMUSCULAR | Status: AC
  Filled 2017-11-01: qty 1

## 2017-11-01 MED ORDER — PACLITAXEL PROTEIN-BOUND CHEMO INJECTION 100 MG
98.0000 mg/m2 | Freq: Once | INTRAVENOUS | Status: AC
  Administered 2017-11-01: 200 mg via INTRAVENOUS
  Filled 2017-11-01: qty 40

## 2017-11-01 MED ORDER — ACETAMINOPHEN 325 MG PO TABS
ORAL_TABLET | ORAL | Status: AC
  Filled 2017-11-01: qty 2

## 2017-11-01 MED ORDER — ALTEPLASE 2 MG IJ SOLR
INTRAMUSCULAR | Status: AC
  Filled 2017-11-01: qty 2

## 2017-11-01 MED ORDER — ACETAMINOPHEN 325 MG PO TABS
650.0000 mg | ORAL_TABLET | Freq: Once | ORAL | Status: AC
  Administered 2017-11-01: 650 mg via ORAL

## 2017-11-01 MED ORDER — DEXAMETHASONE SODIUM PHOSPHATE 10 MG/ML IJ SOLN
INTRAMUSCULAR | Status: AC
  Filled 2017-11-01: qty 1

## 2017-11-01 MED ORDER — DEXAMETHASONE SODIUM PHOSPHATE 10 MG/ML IJ SOLN
10.0000 mg | Freq: Once | INTRAMUSCULAR | Status: AC
  Administered 2017-11-01: 10 mg via INTRAVENOUS

## 2017-11-01 MED ORDER — ALTEPLASE 2 MG IJ SOLR
2.0000 mg | Freq: Once | INTRAMUSCULAR | Status: AC | PRN
  Administered 2017-11-01: 2 mg
  Filled 2017-11-01: qty 2

## 2017-11-01 NOTE — Patient Instructions (Signed)
[facility] Discharge Instructions for Patients Receiving Chemotherapy  Today you received the following chemotherapy agents Cyramza and Abraxane   To help prevent nausea and vomiting after your treatment, we encourage you to take your nausea medication as directed.    If you develop nausea and vomiting that is not controlled by your nausea medication, call the clinic.   BELOW ARE SYMPTOMS THAT SHOULD BE REPORTED IMMEDIATELY:  *FEVER GREATER THAN 100.5 F  *CHILLS WITH OR WITHOUT FEVER  NAUSEA AND VOMITING THAT IS NOT CONTROLLED WITH YOUR NAUSEA MEDICATION  *UNUSUAL SHORTNESS OF BREATH  *UNUSUAL BRUISING OR BLEEDING  TENDERNESS IN MOUTH AND THROAT WITH OR WITHOUT PRESENCE OF ULCERS  *URINARY PROBLEMS  *BOWEL PROBLEMS  UNUSUAL RASH Items with * indicate a potential emergency and should be followed up as soon as possible.  Feel free to call the clinic should you have any questions or concerns. The clinic phone number is [phone].  Please show the [facility] at check-in to the Emergency Department and triage nurse.

## 2017-11-01 NOTE — Telephone Encounter (Signed)
Scheduled appt per 4/11 los - gave patient AVS and calender per los. - did not schedule 5/8 with [facility] due to patient request afternoon appt.

## 2017-11-01 NOTE — Progress Notes (Signed)
[facility] OFFICE PROGRESS NOTE   Diagnosis: Esophagus cancer  INTERVAL HISTORY:   Justin [name] returns as scheduled.  He completed another treatment with Abraxane/ramucirumab 10/18/2017.  He denies nausea/vomiting.  No mouth sores.  No diarrhea.  Neuropathy symptoms in the fingertips have improved.  No bleeding.  He has stable intermittent dyspnea.  No dysphagia.  He has a chronic cough.  He describes his appetite is "decent".  He continues to have intermittent swelling of the left leg.  No calf pain.  The swelling improves overnight.  Objective:  Vital signs in last 24 hours:  Blood pressure (!) 155/77, pulse 98, temperature (!) 97.1 F (36.2 C), temperature source Oral, resp. rate 18, height _0  (1.778 m), weight 192 lb (87.1 kg), SpO2 99 %.    HEENT: No thrush or ulcers. Resp: Distant breath sounds.  No respiratory distress. Cardio: Regular rate and rhythm. GI: Abdomen soft and nontender.  No hepatomegaly. Vascular: Trace edema left lower leg/ankle.  Calves soft and nontender.  Skin: No rash. Port-A-Cath without erythema.   Lab Results:  Lab Results  Component Value Date   WBC 6.7 11/01/2017   HGB 14.5 08/09/2017   HCT 38.3 (L) 11/01/2017   MCV 89.1 11/01/2017   PLT 133 (L) 11/01/2017   NEUTROABS 3.7 11/01/2017    Imaging:  No results found.  Medications: I have reviewed the patient's current medications.  Assessment/Plan: 1. 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 Staging CTs 08/18/2016 with nonspecific pulmonary nodules, liver metastases, borderline thoracic and gastrohepatic ligament lymphadenopathy ? Cycle 1 FOLFOX 09/04/2016 ? Cycle 2 FOLFOX 09/18/2016 ? Cycle 3 FOLFOX 10/02/2016 ? Cycle 4 FOLFOX 10/16/2016 ? Cycle 5 FOLFOX 10/30/2016 ? Restaging CTs 11/10/2016-decreased wall thickness distal esophagus; stable borderline mediastinal lymphadenopathy; multiple liver metastases  have completely resolved; stable scattered nonspecific tiny bilateral lung nodules ? Cycle 6 FOLFOX 11/13/2016 ? Cycle 7 FOLFOX 11/27/2016 ? Cycle 8 FOLFOX 12/11/2016 ? Cycle 9 FOLFOX 12/25/2016 ? Cycle 10 FOLFOX (oxaliplatin deleted) 01/08/2017 ? Initiation of maintenance therapy with 5-FU/leucovorin 02/06/2017 ? CTs 03/16/2017-stable pulmonary nodules, no esophagus mass, 1 subtle liver lesion ? Maintenance therapy continued with 5-FU/leucovorin every 3 weeks beginning 03/20/2017 ? CTs 06/29/2017-progressive liver metastases ? Cycle 1 Taxol/ramucirumab 07/12/2017, allergic reaction to Taxol ? Emergency room with right upper quadrant pain 07/14/2017-CTwithenlargement of liver lesions ? Cycle 1 day 8 07/18/2017-Abraxane substituted for Taxol ? Cycle 2-day 1 Abraxane/ramucirumab 08/09/2017 ? CEA improved 08/09/2017 ? CEA improved2/07/2017 ? Cycle 3-day 1 Abraxane/ramucirumab 09/06/2017;cycle 3-day 15 held due to neutropenia, poor performance status ? CTs 09/27/2017-slight decrease in right supraclavicular lymph nodes, mild increase in size of central right liver lesion, other liver lesions stable, stable small pulmonary nodules, marked narrowing of the SVC with thrombus around the Port-A-Cath ? Abraxane/ramucirumab continued on a 2-week schedule 2. Diabetes  3. COPD  4. Hypertension  5. History of colon polyps  6. Port-A-Cath placement 08/30/2016  7. 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  8. Right upper extremity deep vein thrombosis, Port-A-Cath related 08/24/2018started Xarelto 03/16/2017, possible subsegmental right lower lobe pulmonary embolus on CT 03/16/2017  CT 09/27/2017- narrowing of the SVC with thrombus around the Port-A-Cath, extensive chest wall collaterals  9.Gross hematuria 05/24/2017-status post bladder irrigation in the emergency room, referred to urology  10.Depression. Trial of Remeron initiated  09/06/2017.    Disposition: Justin [name] appears stable.  Plan to continue every 2-week Abraxane/ramucirumab.  We reviewed the CBC from  today.  Counts adequate for treatment.  He will return for lab, follow-up and Abraxane/ramucirumab in 2 weeks.  He will contact the office in the interim with any problems.      Ned Card ANP/GNP-BC   11/01/2017  11:07 AM

## 2017-11-11 ENCOUNTER — Other Ambulatory Visit: Payer: Self-pay | Admitting: Oncology

## 2017-11-13 ENCOUNTER — Encounter: Payer: Self-pay | Admitting: Family Medicine

## 2017-11-13 ENCOUNTER — Ambulatory Visit (INDEPENDENT_AMBULATORY_CARE_PROVIDER_SITE_OTHER): Payer: Medicare Other | Admitting: Family Medicine

## 2017-11-13 VITALS — BP 130/78 | HR 93 | Temp 97.6°F | Ht 69.0 in | Wt 187.8 lb

## 2017-11-13 DIAGNOSIS — N401 Enlarged prostate with lower urinary tract symptoms: Secondary | ICD-10-CM | POA: Diagnosis not present

## 2017-11-13 DIAGNOSIS — N400 Enlarged prostate without lower urinary tract symptoms: Secondary | ICD-10-CM

## 2017-11-13 DIAGNOSIS — E118 Type 2 diabetes mellitus with unspecified complications: Secondary | ICD-10-CM | POA: Diagnosis not present

## 2017-11-13 DIAGNOSIS — J309 Allergic rhinitis, unspecified: Secondary | ICD-10-CM

## 2017-11-13 DIAGNOSIS — E1169 Type 2 diabetes mellitus with other specified complication: Secondary | ICD-10-CM | POA: Diagnosis not present

## 2017-11-13 DIAGNOSIS — E1121 Type 2 diabetes mellitus with diabetic nephropathy: Secondary | ICD-10-CM

## 2017-11-13 DIAGNOSIS — R350 Frequency of micturition: Secondary | ICD-10-CM

## 2017-11-13 DIAGNOSIS — E785 Hyperlipidemia, unspecified: Secondary | ICD-10-CM

## 2017-11-13 DIAGNOSIS — C155 Malignant neoplasm of lower third of esophagus: Secondary | ICD-10-CM | POA: Diagnosis not present

## 2017-11-13 NOTE — Progress Notes (Signed)
  Subjective:    Patient ID: [name], male    DOB: 1950/11/18, 67 y.o.   MRN: [id number]  Justin [name] is a 67 y.o. male who presents for follow-up of Type 2 diabetes mellitus.  Patient is checking home blood sugars.   Home blood sugar records: meter recrds How often is blood sugars being checked: 1 time a week Current symptoms/problems include none and have been unchanged. Daily foot checks: yes   Any foot concerns:no Last eye exam: over year Exercise: walk and worked in yard He is still being treated for his esophageal cancer.  He has had several rounds of chemotherapy and has had an effect on making him quite tired.  This is also interfered with his nutrition and he has lost several pounds.  He seems to be handling this well but does recognize the gravity of the situation.  His allergies seem to be under good control.  He continues on his Januvia as well as lisinopril and finasteride. The following portons of the patient's history were reviewed and updated as appropriate: allergies, current medications, past medical history, past social history and problem list.  ROS as in subjective above.     Objective:    Physical Exam Alert and in no distress otherwise not examined.   Lab Review Diabetic Labs Latest Ref Rng & Units 11/01/2017 10/18/2017 10/04/2017 09/20/2017 09/13/2017  HbA1c - - - - - -  Microalbumin mg/L - - - - -  Micro/Creat Ratio - - - - - -  Chol <200 mg/dL - - - - -  HDL >40 mg/dL - - - - -  Calc LDL <100 mg/dL - - - - -  Triglycerides <150 mg/dL - - - - -  Creatinine 0.70 - 1.30 mg/dL 0.74 0.75 0.77 0.75 0.71   BP/Weight 11/13/2017 11/01/2017 10/18/2017 10/04/2017 9/51/8841  Systolic BP 660 630 160 109 323  Diastolic BP 78 77 68 80 76  Wt. (Lbs) 187.8 192 190.4 193.5 -  BMI 27.73 27.55 27.32 27.76 -   Foot/eye exam completion dates 01/28/2016 01/29/2015  Foot Form Completion Done Done  A1c is 5.3  Justin [name]  reports that he has been smoking cigarettes.  He has a  50.00 pack-year smoking history. He has never used smokeless tobacco. He reports that he does not drink alcohol or use drugs.     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 Rx changes: Stop Januvia 2. Education: Reviewed 'ABCs' of diabetes management (respective goals in parentheses):  A1C (<7), blood pressure (<130/80), and cholesterol (LDL <100). 3. Compliance at present is estimated to be excellent. Efforts to improve compliance (if necessary) will be directed at nothing. 4. Follow up: 4 months I discussed the cancer with him.  He is somewhat despondent but has a positive attitude towards continuing to move forward.  Since his A1c is so low I think it is reasonable to stop and continue to monitor this.  I will renew his finasteride.

## 2017-11-14 ENCOUNTER — Other Ambulatory Visit: Payer: Self-pay

## 2017-11-14 ENCOUNTER — Encounter: Payer: Self-pay | Admitting: Family Medicine

## 2017-11-14 DIAGNOSIS — N400 Enlarged prostate without lower urinary tract symptoms: Secondary | ICD-10-CM

## 2017-11-14 MED ORDER — FINASTERIDE 5 MG PO TABS: 5.0000 mg | ORAL_TABLET | Freq: Every day | ORAL | 3 refills | Status: DC

## 2017-11-14 MED ORDER — FINASTERIDE 5 MG PO TABS: 5.0000 mg | ORAL_TABLET | Freq: Every day | ORAL | 3 refills | Status: AC

## 2017-11-14 NOTE — Telephone Encounter (Signed)
Patient refill on [facility] sent [facility] stating patient does not have membership with them. Contacted patient and he said medication should be sent to [facility]. Script is being sent.

## 2017-11-15 ENCOUNTER — Inpatient Hospital Stay: Payer: Medicare Other

## 2017-11-15 ENCOUNTER — Telehealth: Payer: Self-pay

## 2017-11-15 ENCOUNTER — Inpatient Hospital Stay (HOSPITAL_BASED_OUTPATIENT_CLINIC_OR_DEPARTMENT_OTHER): Payer: Medicare Other | Admitting: Oncology

## 2017-11-15 VITALS — BP 144/76 | HR 87 | Temp 97.6°F | Resp 17 | Ht 69.0 in | Wt 189.0 lb

## 2017-11-15 DIAGNOSIS — E119 Type 2 diabetes mellitus without complications: Secondary | ICD-10-CM

## 2017-11-15 DIAGNOSIS — Z95828 Presence of other vascular implants and grafts: Secondary | ICD-10-CM

## 2017-11-15 DIAGNOSIS — C155 Malignant neoplasm of lower third of esophagus: Secondary | ICD-10-CM

## 2017-11-15 DIAGNOSIS — J449 Chronic obstructive pulmonary disease, unspecified: Secondary | ICD-10-CM | POA: Diagnosis not present

## 2017-11-15 DIAGNOSIS — Z86010 Personal history of colonic polyps: Secondary | ICD-10-CM | POA: Diagnosis not present

## 2017-11-15 DIAGNOSIS — F329 Major depressive disorder, single episode, unspecified: Secondary | ICD-10-CM | POA: Diagnosis not present

## 2017-11-15 DIAGNOSIS — Z86718 Personal history of other venous thrombosis and embolism: Secondary | ICD-10-CM | POA: Diagnosis not present

## 2017-11-15 DIAGNOSIS — I10 Essential (primary) hypertension: Secondary | ICD-10-CM

## 2017-11-15 DIAGNOSIS — Z5111 Encounter for antineoplastic chemotherapy: Secondary | ICD-10-CM | POA: Diagnosis not present

## 2017-11-15 DIAGNOSIS — R600 Localized edema: Secondary | ICD-10-CM

## 2017-11-15 DIAGNOSIS — C787 Secondary malignant neoplasm of liver and intrahepatic bile duct: Secondary | ICD-10-CM

## 2017-11-15 DIAGNOSIS — Z5112 Encounter for antineoplastic immunotherapy: Secondary | ICD-10-CM | POA: Diagnosis not present

## 2017-11-15 LAB — CMP (CANCER CENTER ONLY)
ALBUMIN: 3.7 g/dL (ref 3.5–5.0)
ALT: 25 U/L (ref 10–47)
ANION GAP: 10 (ref 5–15)
AST: 30 U/L (ref 11–38)
Alkaline Phosphatase: 125 U/L (ref 40–150)
BUN: 11 mg/dL (ref 7–26)
CHLORIDE: 106 mmol/L (ref 98–109)
CO2: 22 mmol/L (ref 22–29)
Calcium: 9.3 mg/dL (ref 8.4–10.4)
Creatinine: 0.76 mg/dL (ref 0.60–1.20)
GFR, Estimated: >60 mL/min (ref >60)
Glucose, Bld: 110 mg/dL (ref 70–140)
Potassium: 3.8 mmol/L (ref 3.5–5.1)
SODIUM: 139 mmol/L (ref 136–145)
Total Bilirubin: 0.3 mg/dL (ref 0.2–1.6)
Total Protein: 7.1 g/dL (ref 6.4–8.3)

## 2017-11-15 LAB — CBC WITH DIFFERENTIAL (CANCER CENTER ONLY)
BASOS ABS: 0 10*3/uL (ref 0.0–0.1)
BASOS PCT: 1 %
EOS ABS: 0.1 10*3/uL (ref 0.0–0.5)
EOS PCT: 2 %
HCT: 39 % (ref 38.4–49.9)
Hemoglobin: 12.6 g/dL — ABNORMAL LOW (ref 13.0–17.1)
LYMPHS ABS: 1.7 10*3/uL (ref 0.9–3.3)
Lymphocytes Relative: 25 %
MCH: 28.6 pg (ref 27.2–33.4)
MCHC: 32.3 g/dL (ref 32.0–36.0)
MCV: 88.4 fL (ref 79.3–98.0)
MONO ABS: 1.4 10*3/uL — AB (ref 0.1–0.9)
MONOS PCT: 19 %
NEUTROS ABS: 3.7 10*3/uL (ref 1.5–6.5)
NEUTROS PCT: 53 %
Platelet Count: 144 10*3/uL (ref 140–400)
RBC: 4.41 MIL/uL (ref 4.20–5.82)
RDW: 17.7 % — AB (ref 11.0–14.6)
WBC Count: 7 10*3/uL (ref 4.0–10.3)

## 2017-11-15 LAB — TOTAL PROTEIN, URINE DIPSTICK: PROTEIN: NEGATIVE mg/dL

## 2017-11-15 LAB — CEA (IN HOUSE-CHCC): CEA (CHCC-In House): 62.8 ng/mL — ABNORMAL HIGH (ref 0.00–5.00)

## 2017-11-15 MED ORDER — DEXAMETHASONE SODIUM PHOSPHATE 10 MG/ML IJ SOLN
10.0000 mg | Freq: Once | INTRAMUSCULAR | Status: AC
  Administered 2017-11-15: 10 mg via INTRAVENOUS

## 2017-11-15 MED ORDER — ALTEPLASE 2 MG IJ SOLR
2.0000 mg | Freq: Once | INTRAMUSCULAR | Status: AC | PRN
  Administered 2017-11-15: 2 mg
  Filled 2017-11-15: qty 2

## 2017-11-15 MED ORDER — PROCHLORPERAZINE MALEATE 10 MG PO TABS
ORAL_TABLET | ORAL | Status: AC
  Filled 2017-11-15: qty 1

## 2017-11-15 MED ORDER — PACLITAXEL PROTEIN-BOUND CHEMO INJECTION 100 MG
100.0000 mg/m2 | Freq: Once | INTRAVENOUS | Status: AC
  Administered 2017-11-15: 200 mg via INTRAVENOUS
  Filled 2017-11-15: qty 40

## 2017-11-15 MED ORDER — SODIUM CHLORIDE 0.9% FLUSH
10.0000 mL | INTRAVENOUS | Status: DC | PRN
Start: 2017-11-15 — End: 2017-11-15
  Administered 2017-11-15: 10 mL
  Filled 2017-11-15: qty 10

## 2017-11-15 MED ORDER — ACETAMINOPHEN 325 MG PO TABS
650.0000 mg | ORAL_TABLET | Freq: Once | ORAL | Status: AC
  Administered 2017-11-15: 650 mg via ORAL

## 2017-11-15 MED ORDER — DIPHENHYDRAMINE HCL 50 MG/ML IJ SOLN
INTRAMUSCULAR | Status: AC
  Filled 2017-11-15: qty 1

## 2017-11-15 MED ORDER — ALTEPLASE 2 MG IJ SOLR
INTRAMUSCULAR | Status: AC
  Filled 2017-11-15: qty 2

## 2017-11-15 MED ORDER — ACETAMINOPHEN 325 MG PO TABS
ORAL_TABLET | ORAL | Status: AC
  Filled 2017-11-15: qty 2

## 2017-11-15 MED ORDER — HEPARIN SOD (PORK) LOCK FLUSH 100 UNIT/ML IV SOLN
500.0000 [IU] | Freq: Once | INTRAVENOUS | Status: AC | PRN
  Administered 2017-11-15: 500 [IU]
  Filled 2017-11-15: qty 5

## 2017-11-15 MED ORDER — DIPHENHYDRAMINE HCL 50 MG/ML IJ SOLN
25.0000 mg | Freq: Once | INTRAMUSCULAR | Status: AC
  Administered 2017-11-15: 25 mg via INTRAVENOUS

## 2017-11-15 MED ORDER — SODIUM CHLORIDE 0.9 % IV SOLN
Freq: Once | INTRAVENOUS | Status: AC
  Administered 2017-11-15: 11:00:00 via INTRAVENOUS

## 2017-11-15 MED ORDER — PROCHLORPERAZINE MALEATE 10 MG PO TABS
10.0000 mg | ORAL_TABLET | Freq: Once | ORAL | Status: AC
  Administered 2017-11-15: 10 mg via ORAL

## 2017-11-15 MED ORDER — DEXAMETHASONE SODIUM PHOSPHATE 10 MG/ML IJ SOLN
INTRAMUSCULAR | Status: AC
  Filled 2017-11-15: qty 1

## 2017-11-15 MED ORDER — SODIUM CHLORIDE 0.9 % IV SOLN
8.0000 mg/kg | Freq: Once | INTRAVENOUS | Status: AC
  Administered 2017-11-15: 700 mg via INTRAVENOUS
  Filled 2017-11-15: qty 70

## 2017-11-15 NOTE — Telephone Encounter (Signed)
Printed avs and calender of upcoming appointment. Per 4/25 los

## 2017-11-15 NOTE — Patient Instructions (Signed)
[facility] Discharge Instructions for Patients Receiving Chemotherapy  Today you received the following chemotherapy agents Cyramza and Abraxane   To help prevent nausea and vomiting after your treatment, we encourage you to take your nausea medication as directed.    If you develop nausea and vomiting that is not controlled by your nausea medication, call the clinic.   BELOW ARE SYMPTOMS THAT SHOULD BE REPORTED IMMEDIATELY:  *FEVER GREATER THAN 100.5 F  *CHILLS WITH OR WITHOUT FEVER  NAUSEA AND VOMITING THAT IS NOT CONTROLLED WITH YOUR NAUSEA MEDICATION  *UNUSUAL SHORTNESS OF BREATH  *UNUSUAL BRUISING OR BLEEDING  TENDERNESS IN MOUTH AND THROAT WITH OR WITHOUT PRESENCE OF ULCERS  *URINARY PROBLEMS  *BOWEL PROBLEMS  UNUSUAL RASH Items with * indicate a potential emergency and should be followed up as soon as possible.  Feel free to call the clinic should you have any questions or concerns. The clinic phone number is [phone].  Please show the [place] at check-in to the Emergency Department and triage nurse.

## 2017-11-15 NOTE — Progress Notes (Signed)
[place] OFFICE PROGRESS NOTE   Diagnosis: Esophagus cancer  INTERVAL HISTORY:   Justin [name] returns as scheduled.  He continues Abraxane/ramucirumab.  He reports malaise for 3 to 4 days following chemotherapy.  He has intermittent discomfort at the right upper abdomen, relieved with hydrocodone.  Good appetite. The Port-A-Cath has required Cathflo on multiple occasions in order to obtain blood return.  Objective:  Vital signs in last 24 hours:  Blood pressure (!) 144/76, pulse 87, temperature 97.6 F (36.4 C), temperature source Oral, resp. rate 17, height 5' 9"  (1.753 m), weight 189 lb (85.7 kg), SpO2 100 %.    HEENT: No face or neck edema, no thrush or ulcers Resp: Scattered rhonchi Cardio: Regular rate and rhythm GI: No hepatomegaly, no mass, nontender Vascular: The left lower leg is slightly larger than the right side, no edema  Portacath/PICC-without erythema  Lab Results:  Lab Results  Component Value Date   WBC 7.0 11/15/2017   HGB 12.6 (L) 11/15/2017   HCT 39.0 11/15/2017   MCV 88.4 11/15/2017   PLT 144 11/15/2017   NEUTROABS 3.7 11/15/2017    CMP     Component Value Date/Time   NA 138 11/01/2017 0944   NA 138 07/26/2017 1335   K 3.9 11/01/2017 0944   K 3.9 07/26/2017 1335   CL 107 11/01/2017 0944   CO2 24 11/01/2017 0944   CO2 24 07/26/2017 1335   GLUCOSE 123 11/01/2017 0944   GLUCOSE 129 07/26/2017 1335   BUN 8 11/01/2017 0944   BUN 10.7 07/26/2017 1335   CREATININE 0.74 11/01/2017 0944   CREATININE 0.9 07/26/2017 1335   CALCIUM 9.3 11/01/2017 0944   CALCIUM 9.2 07/26/2017 1335   PROT 6.9 11/01/2017 0944   PROT 7.1 07/26/2017 1335   ALBUMIN 3.4 (L) 11/01/2017 0944   ALBUMIN 3.4 (L) 07/26/2017 1335   AST 20 11/01/2017 0944   AST 18 07/26/2017 1335   ALT 16 11/01/2017 0944   ALT 26 07/26/2017 1335   ALKPHOS 113 11/01/2017 0944   ALKPHOS 136 07/26/2017 1335   BILITOT 0.4 11/01/2017 0944   BILITOT 0.31 07/26/2017 1335   GFRNONAA >60 11/01/2017 0944   GFRAA >60 11/01/2017 0944    Lab Results  Component Value Date   CEA1 21.67 (H) 10/18/2017    Medications: I have reviewed the patient's current medications.   Assessment/Plan: 1. 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 Staging CTs 08/18/2016 with nonspecific pulmonary nodules, liver metastases, borderline thoracic and gastrohepatic ligament lymphadenopathy ? Cycle 1 FOLFOX 09/04/2016 ? Cycle 2 FOLFOX 09/18/2016 ? Cycle 3 FOLFOX 10/02/2016 ? Cycle 4 FOLFOX 10/16/2016 ? Cycle 5 FOLFOX 10/30/2016 ? Restaging CTs 11/10/2016-decreased wall thickness distal esophagus; stable borderline mediastinal lymphadenopathy; multiple liver metastases have completely resolved; stable scattered nonspecific tiny bilateral lung nodules ? Cycle 6 FOLFOX 11/13/2016 ? Cycle 7 FOLFOX 11/27/2016 ? Cycle 8 FOLFOX 12/11/2016 ? Cycle 9 FOLFOX 12/25/2016 ? Cycle 10 FOLFOX (oxaliplatin deleted) 01/08/2017 ? Initiation of maintenance therapy with 5-FU/leucovorin 02/06/2017 ? CTs 03/16/2017-stable pulmonary nodules, no esophagus mass, 1 subtle liver lesion ? Maintenance therapy continued with 5-FU/leucovorin every 3 weeks beginning 03/20/2017 ? CTs 06/29/2017-progressive liver metastases ? Cycle 1 Taxol/ramucirumab 07/12/2017, allergic reaction to Taxol ? Emergency room with right upper quadrant pain 07/14/2017-CTwithenlargement of liver lesions ? Cycle 1 day 8 07/18/2017-Abraxane substituted for Taxol ? Cycle 2-day 1 Abraxane/ramucirumab 08/09/2017 ? CEA improved 08/09/2017 ? CEA improved2/07/2017 ? Cycle 3-day 1 Abraxane/ramucirumab 09/06/2017;cycle 3-day  15 held due to neutropenia, poor performance status ? CTs 09/27/2017-slight decrease in right supraclavicular lymph nodes, mild increase in size of central right liver lesion, other liver lesions stable, stable small pulmonary nodules, marked narrowing of the SVC  with thrombus around the Port-A-Cath ? Abraxane/ramucirumab continued on a 2-week schedule 2. Diabetes  3. COPD  4. Hypertension  5. History of colon polyps  6. Port-A-Cath placement 08/30/2016  7. 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  8. Right upper extremity deep vein thrombosis, Port-A-Cath related 08/24/2018started Xarelto 03/16/2017, possible subsegmental right lower lobe pulmonary embolus on CT 03/16/2017  CT 09/27/2017-narrowing of the SVC with thrombus around the Port-A-Cath, extensive chest wall collaterals  9.Gross hematuria 05/24/2017-status post bladder irrigation in the emergency room, referred to urology  10.Depression. Trial of Remeron initiated 09/06/2017.   Disposition: Justin [name] appears unchanged.  He will continue Taxol/ramucirumab.  We will follow-up on the CEA from today.  We will plan for a restaging CT within the next 1-2 months. He appears to have a port related thrombus in the SVC.  He is asymptomatic from this.  We discussed the case with interventional radiology and Justin [name].  He does not wish to undergo revision of the Port-A-Cath at present.  We will continue using Cathflo as needed.  Justin [name] will return for an office visit and chemotherapy in 2 weeks.  Betsy Coder, MD  11/15/2017  10:40 AM

## 2017-11-20 ENCOUNTER — Other Ambulatory Visit: Payer: Self-pay | Admitting: *Deleted

## 2017-11-20 DIAGNOSIS — C155 Malignant neoplasm of lower third of esophagus: Secondary | ICD-10-CM

## 2017-11-20 MED ORDER — HYDROCODONE-ACETAMINOPHEN 5-325 MG PO TABS: 1.0000 | ORAL_TABLET | ORAL | 0 refills | Status: DC | PRN

## 2017-11-20 NOTE — Telephone Encounter (Signed)
Message from pt requesting Hydrocodone refill. Script left in book for pick up. Left message informing pt.

## 2017-11-28 ENCOUNTER — Inpatient Hospital Stay (HOSPITAL_BASED_OUTPATIENT_CLINIC_OR_DEPARTMENT_OTHER): Payer: Medicare Other | Admitting: Nurse Practitioner

## 2017-11-28 ENCOUNTER — Inpatient Hospital Stay: Payer: Medicare Other

## 2017-11-28 ENCOUNTER — Inpatient Hospital Stay: Payer: Medicare Other | Attending: Oncology

## 2017-11-28 ENCOUNTER — Encounter: Payer: Self-pay | Admitting: Nurse Practitioner

## 2017-11-28 VITALS — BP 135/79 | HR 79 | Temp 97.5°F | Resp 17 | Ht 69.0 in | Wt 187.2 lb

## 2017-11-28 DIAGNOSIS — Z9225 Personal history of immunosupression therapy: Secondary | ICD-10-CM | POA: Diagnosis not present

## 2017-11-28 DIAGNOSIS — Z79899 Other long term (current) drug therapy: Secondary | ICD-10-CM

## 2017-11-28 DIAGNOSIS — R918 Other nonspecific abnormal finding of lung field: Secondary | ICD-10-CM | POA: Insufficient documentation

## 2017-11-28 DIAGNOSIS — R5383 Other fatigue: Secondary | ICD-10-CM | POA: Insufficient documentation

## 2017-11-28 DIAGNOSIS — Z86718 Personal history of other venous thrombosis and embolism: Secondary | ICD-10-CM | POA: Insufficient documentation

## 2017-11-28 DIAGNOSIS — R197 Diarrhea, unspecified: Secondary | ICD-10-CM

## 2017-11-28 DIAGNOSIS — R634 Abnormal weight loss: Secondary | ICD-10-CM | POA: Diagnosis not present

## 2017-11-28 DIAGNOSIS — Z9221 Personal history of antineoplastic chemotherapy: Secondary | ICD-10-CM

## 2017-11-28 DIAGNOSIS — C787 Secondary malignant neoplasm of liver and intrahepatic bile duct: Secondary | ICD-10-CM | POA: Insufficient documentation

## 2017-11-28 DIAGNOSIS — J449 Chronic obstructive pulmonary disease, unspecified: Secondary | ICD-10-CM

## 2017-11-28 DIAGNOSIS — R200 Anesthesia of skin: Secondary | ICD-10-CM | POA: Diagnosis not present

## 2017-11-28 DIAGNOSIS — C155 Malignant neoplasm of lower third of esophagus: Secondary | ICD-10-CM | POA: Diagnosis not present

## 2017-11-28 DIAGNOSIS — Z86010 Personal history of colonic polyps: Secondary | ICD-10-CM | POA: Diagnosis not present

## 2017-11-28 DIAGNOSIS — I10 Essential (primary) hypertension: Secondary | ICD-10-CM | POA: Insufficient documentation

## 2017-11-28 DIAGNOSIS — R5381 Other malaise: Secondary | ICD-10-CM | POA: Diagnosis not present

## 2017-11-28 DIAGNOSIS — Z95828 Presence of other vascular implants and grafts: Secondary | ICD-10-CM

## 2017-11-28 DIAGNOSIS — E119 Type 2 diabetes mellitus without complications: Secondary | ICD-10-CM

## 2017-11-28 DIAGNOSIS — F329 Major depressive disorder, single episode, unspecified: Secondary | ICD-10-CM | POA: Diagnosis not present

## 2017-11-28 DIAGNOSIS — F4329 Adjustment disorder with other symptoms: Secondary | ICD-10-CM

## 2017-11-28 DIAGNOSIS — R109 Unspecified abdominal pain: Secondary | ICD-10-CM | POA: Insufficient documentation

## 2017-11-28 LAB — CBC WITH DIFFERENTIAL (CANCER CENTER ONLY)
Basophils Absolute: 0.1 10*3/uL (ref 0.0–0.1)
Basophils Relative: 1 %
EOS ABS: 0.1 10*3/uL (ref 0.0–0.5)
EOS PCT: 1 %
HCT: 36.5 % — ABNORMAL LOW (ref 38.4–49.9)
Hemoglobin: 11.8 g/dL — ABNORMAL LOW (ref 13.0–17.1)
LYMPHS ABS: 1.7 10*3/uL (ref 0.9–3.3)
Lymphocytes Relative: 24 %
MCH: 28 pg (ref 27.2–33.4)
MCHC: 32.3 g/dL (ref 32.0–36.0)
MCV: 86.8 fL (ref 79.3–98.0)
MONOS PCT: 17 %
Monocytes Absolute: 1.2 10*3/uL — ABNORMAL HIGH (ref 0.1–0.9)
Neutro Abs: 4.1 10*3/uL (ref 1.5–6.5)
Neutrophils Relative %: 57 %
PLATELETS: 148 10*3/uL (ref 140–400)
RBC: 4.21 MIL/uL (ref 4.20–5.82)
RDW: 18.7 % — ABNORMAL HIGH (ref 11.0–14.6)
WBC: 7.2 10*3/uL (ref 4.0–10.3)

## 2017-11-28 LAB — CMP (CANCER CENTER ONLY)
ALBUMIN: 3.6 g/dL (ref 3.5–5.0)
ALK PHOS: 146 U/L (ref 40–150)
ALT: 26 U/L (ref 0–55)
AST: 30 U/L (ref 5–34)
Anion gap: 7 (ref 3–11)
BUN: 9 mg/dL (ref 7–26)
CALCIUM: 9.3 mg/dL (ref 8.4–10.4)
CO2: 23 mmol/L (ref 22–29)
CREATININE: 0.85 mg/dL (ref 0.70–1.30)
Chloride: 108 mmol/L (ref 98–109)
GFR, Est AFR Am: >60 mL/min (ref >60)
GLUCOSE: 87 mg/dL (ref 70–140)
Potassium: 3.7 mmol/L (ref 3.5–5.1)
Sodium: 138 mmol/L (ref 136–145)
TOTAL PROTEIN: 7.1 g/dL (ref 6.4–8.3)
Total Bilirubin: 0.4 mg/dL (ref 0.2–1.2)

## 2017-11-28 MED ORDER — SODIUM CHLORIDE 0.9% FLUSH
10.0000 mL | INTRAVENOUS | Status: DC | PRN
  Administered 2017-11-28: 10 mL via INTRAVENOUS
  Filled 2017-11-28: qty 10

## 2017-11-28 MED ORDER — ALTEPLASE 2 MG IJ SOLR
2.0000 mg | Freq: Once | INTRAMUSCULAR | Status: AC | PRN
  Administered 2017-11-28: 2 mg
  Filled 2017-11-28: qty 2

## 2017-11-28 NOTE — Progress Notes (Signed)
[facility] OFF[facility] PROGRESS NOTE   Diagnosis: Esophagus Justin [name]  INTERVAL HISTORY:   Justin Justin [name] returns as scheduled.  He continues Abraxane/ramucirumab.  He does not feel well.  He has noted a marked decline in his energy level over the past 3 to 4 days.  He denies nausea/vomiting.  He had a single loose stool earlier this week.  No mouth sores.  Stable to mildly increased shortness of breath.  No fever.  No bleeding.  Stable neuropathy symptoms.  He continues to have intermittent pain along the right chest/abdomen.  His daughter reports continued weight loss.  Appetite is unchanged.  Objective:  Vital signs in last 24 hours:  Blood pressure 135/79, pulse 79, temperature (!) 97.5 F (36.4 C), temperature source Oral, resp. rate 17, height _0  (1.753 m), weight 187 lb 3.2 oz (84.9 kg), SpO2 99 %.    HEENT: No thrush or ulcers. Lymphatics: No palpable cervical or supraclavicular lymph nodes. Resp: Lungs clear bilaterally. Cardio: Regular rate and rhythm. GI: Abdomen soft and nontender.  No hepatomegaly. Vascular: Left lower leg is slightly larger than the right lower leg. Port-A-Cath without erythema.  Lab Results:  Lab Results  Component Value Date   WBC 7.2 11/28/2017   HGB 11.8 (L) 11/28/2017   HCT 36.5 (L) 11/28/2017   MCV 86.8 11/28/2017   PLT 148 11/28/2017   NEUTROABS 4.1 11/28/2017    Imaging:  No results found.  Medications: I have reviewed the patient's current medications.  Assessment/Plan: 1. 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 Staging CTs 08/18/2016 with nonspecific pulmonary nodules, liver metastases, borderline thoracic and gastrohepatic ligament lymphadenopathy ? Cycle 1 FOLFOX 09/04/2016 ? Cycle 2 FOLFOX 09/18/2016 ? Cycle 3 FOLFOX 10/02/2016 ? Cycle 4 FOLFOX 10/16/2016 ? Cycle 5 FOLFOX 10/30/2016 ? Restaging CTs 11/10/2016-decreased wall thickness distal  esophagus; stable borderline mediastinal lymphadenopathy; multiple liver metastases have completely resolved; stable scattered nonspecific tiny bilateral lung nodules ? Cycle 6 FOLFOX 11/13/2016 ? Cycle 7 FOLFOX 11/27/2016 ? Cycle 8 FOLFOX 12/11/2016 ? Cycle 9 FOLFOX 12/25/2016 ? Cycle 10 FOLFOX (oxaliplatin deleted) 01/08/2017 ? Initiation of maintenance therapy with 5-FU/leucovorin 02/06/2017 ? CTs 03/16/2017-stable pulmonary nodules, no esophagus mass, 1 subtle liver lesion ? Maintenance therapy continued with 5-FU/leucovorin every 3 weeks beginning 03/20/2017 ? CTs 06/29/2017-progressive liver metastases ? Cycle 1 Taxol/ramucirumab 07/12/2017, allergic reaction to Taxol ? Emergency room with right upper quadrant pain 07/14/2017-CTwithenlargement of liver lesions ? Cycle 1 day 8 07/18/2017-Abraxane substituted for Taxol ? Cycle 2-day 1 Abraxane/ramucirumab 08/09/2017 ? CEA improved 08/09/2017 ? CEA improved2/07/2017 ? Cycle 3-day 1 Abraxane/ramucirumab 09/06/2017;cycle 3-day 15 held due to neutropenia, poor performance status ? CTs 09/27/2017-slight decrease in right supraclavicular lymph nodes, mild increase in size of central right liver lesion, other liver lesions stable, stable small pulmonary nodules, marked narrowing of the SVC with thrombus around the Port-A-Cath ? Abraxane/ramucirumabcontinued on a 2-week schedule 2. Diabetes  3. COPD  4. Hypertension  5. History of colon polyps  6. Port-A-Cath placement 08/30/2016  7. 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  8. Right upper extremity deep vein thrombosis, Port-A-Cath related 08/24/2018started Xarelto 03/16/2017, possible subsegmental right lower lobe pulmonary embolus on CT 03/16/2017  CT 09/27/2017-narrowing of the SVC with thrombus around the Port-A-Cath, extensive chest wall collaterals  9.Gross hematuria 05/24/2017-status post bladder irrigation in the  emergency room, referred to urology  10.Depression. Trial of Remeron initiated 09/06/2017.     Disposition: Justin Justin [name] is  on active treatment with Abraxane/ramucirumab every 2 weeks.  He presents today for scheduled infusion.  He has developed significant fatigue/malaise.  We discussed possible etiologies including chemotherapy, Justin [name].  We decided to hold today's treatment.  We will check a CEA today.  He will return for a follow-up visit in 1 week for reevaluation.  He will contact the office in the interim with any problems.  25 minutes were spent face-to-face at today's visit with the majority of that time involved in counseling/coordination of care.    Ned Card ANP/GNP-BC   11/28/2017  2:43 PM

## 2017-11-28 NOTE — Progress Notes (Signed)
Port-A-Cath accessed, site flushes well with NS, no swelling in pain noted at or around site. Cath-Flo  Instilled per policy, site marked. Will monitor for blood return.

## 2017-11-29 ENCOUNTER — Telehealth: Payer: Self-pay | Admitting: Nurse Practitioner

## 2017-11-29 LAB — CEA (IN HOUSE-CHCC): CEA (CHCC-IN HOUSE): 105.84 ng/mL — AB (ref 0.00–5.00)

## 2017-11-29 NOTE — Telephone Encounter (Signed)
Scheduled appt per 5/8 os - unable to schedule treatment due to capped day - logged - will contact pt when appt is added.

## 2017-12-05 ENCOUNTER — Inpatient Hospital Stay (HOSPITAL_BASED_OUTPATIENT_CLINIC_OR_DEPARTMENT_OTHER): Payer: Medicare Other | Admitting: Nurse Practitioner

## 2017-12-05 ENCOUNTER — Inpatient Hospital Stay: Payer: Medicare Other

## 2017-12-05 ENCOUNTER — Encounter: Payer: Self-pay | Admitting: Nurse Practitioner

## 2017-12-05 VITALS — BP 142/84 | HR 88 | Temp 97.5°F | Resp 21 | Ht 69.0 in | Wt 189.9 lb

## 2017-12-05 DIAGNOSIS — J449 Chronic obstructive pulmonary disease, unspecified: Secondary | ICD-10-CM

## 2017-12-05 DIAGNOSIS — R5381 Other malaise: Secondary | ICD-10-CM | POA: Diagnosis not present

## 2017-12-05 DIAGNOSIS — R5383 Other fatigue: Secondary | ICD-10-CM

## 2017-12-05 DIAGNOSIS — C787 Secondary malignant neoplasm of liver and intrahepatic bile duct: Secondary | ICD-10-CM | POA: Diagnosis not present

## 2017-12-05 DIAGNOSIS — Z9225 Personal history of immunosupression therapy: Secondary | ICD-10-CM | POA: Diagnosis not present

## 2017-12-05 DIAGNOSIS — Z79899 Other long term (current) drug therapy: Secondary | ICD-10-CM | POA: Diagnosis not present

## 2017-12-05 DIAGNOSIS — I10 Essential (primary) hypertension: Secondary | ICD-10-CM | POA: Diagnosis not present

## 2017-12-05 DIAGNOSIS — R200 Anesthesia of skin: Secondary | ICD-10-CM

## 2017-12-05 DIAGNOSIS — Z9221 Personal history of antineoplastic chemotherapy: Secondary | ICD-10-CM

## 2017-12-05 DIAGNOSIS — Z86718 Personal history of other venous thrombosis and embolism: Secondary | ICD-10-CM | POA: Diagnosis not present

## 2017-12-05 DIAGNOSIS — C155 Malignant neoplasm of lower third of esophagus: Secondary | ICD-10-CM

## 2017-12-05 DIAGNOSIS — Z86010 Personal history of colonic polyps: Secondary | ICD-10-CM | POA: Diagnosis not present

## 2017-12-05 DIAGNOSIS — E119 Type 2 diabetes mellitus without complications: Secondary | ICD-10-CM | POA: Diagnosis not present

## 2017-12-05 DIAGNOSIS — Z95828 Presence of other vascular implants and grafts: Secondary | ICD-10-CM

## 2017-12-05 DIAGNOSIS — R109 Unspecified abdominal pain: Secondary | ICD-10-CM | POA: Diagnosis not present

## 2017-12-05 DIAGNOSIS — R918 Other nonspecific abnormal finding of lung field: Secondary | ICD-10-CM | POA: Diagnosis not present

## 2017-12-05 DIAGNOSIS — R634 Abnormal weight loss: Secondary | ICD-10-CM | POA: Diagnosis not present

## 2017-12-05 DIAGNOSIS — F329 Major depressive disorder, single episode, unspecified: Secondary | ICD-10-CM

## 2017-12-05 DIAGNOSIS — R197 Diarrhea, unspecified: Secondary | ICD-10-CM | POA: Diagnosis not present

## 2017-12-05 LAB — CBC WITH DIFFERENTIAL (CANCER CENTER ONLY)
Basophils Absolute: 0.1 10*3/uL (ref 0.0–0.1)
Basophils Relative: 1 %
EOS ABS: 0.1 10*3/uL (ref 0.0–0.5)
Eosinophils Relative: 1 %
HCT: 36.5 % — ABNORMAL LOW (ref 38.4–49.9)
Hemoglobin: 11.9 g/dL — ABNORMAL LOW (ref 13.0–17.1)
LYMPHS ABS: 1.5 10*3/uL (ref 0.9–3.3)
LYMPHS PCT: 16 %
MCH: 27.9 pg (ref 27.2–33.4)
MCHC: 32.5 g/dL (ref 32.0–36.0)
MCV: 85.9 fL (ref 79.3–98.0)
Monocytes Absolute: 0.9 10*3/uL (ref 0.1–0.9)
Monocytes Relative: 10 %
NEUTROS ABS: 6.8 10*3/uL — AB (ref 1.5–6.5)
Neutrophils Relative %: 72 %
Platelet Count: 162 10*3/uL (ref 140–400)
RBC: 4.25 MIL/uL (ref 4.20–5.82)
RDW: 18.9 % — ABNORMAL HIGH (ref 11.0–14.6)
WBC: 9.4 10*3/uL (ref 4.0–10.3)

## 2017-12-05 LAB — CMP (CANCER CENTER ONLY)
ALK PHOS: 144 U/L (ref 40–150)
ALT: 23 U/L (ref 0–55)
AST: 26 U/L (ref 5–34)
Albumin: 3.3 g/dL — ABNORMAL LOW (ref 3.5–5.0)
Anion gap: 5 (ref 3–11)
BILIRUBIN TOTAL: 0.3 mg/dL (ref 0.2–1.2)
BUN: 8 mg/dL (ref 7–26)
CO2: 24 mmol/L (ref 22–29)
CREATININE: 0.73 mg/dL (ref 0.70–1.30)
Calcium: 8.8 mg/dL (ref 8.4–10.4)
Chloride: 109 mmol/L (ref 98–109)
GFR, Est AFR Am: >60 mL/min (ref >60)
Glucose, Bld: 118 mg/dL (ref 70–140)
POTASSIUM: 3.4 mmol/L — AB (ref 3.5–5.1)
Sodium: 138 mmol/L (ref 136–145)
TOTAL PROTEIN: 6.9 g/dL (ref 6.4–8.3)

## 2017-12-05 MED ORDER — HEPARIN SOD (PORK) LOCK FLUSH 100 UNIT/ML IV SOLN
500.0000 [IU] | Freq: Once | INTRAVENOUS | Status: AC | PRN
  Administered 2017-12-05: 500 [IU] via INTRAVENOUS
  Filled 2017-12-05: qty 5

## 2017-12-05 MED ORDER — SODIUM CHLORIDE 0.9% FLUSH
10.0000 mL | INTRAVENOUS | Status: DC | PRN
Start: 2017-12-05 — End: 2017-12-05
  Administered 2017-12-05: 10 mL via INTRAVENOUS
  Filled 2017-12-05: qty 10

## 2017-12-05 MED ORDER — ALTEPLASE 2 MG IJ SOLR
2.0000 mg | Freq: Once | INTRAMUSCULAR | Status: AC | PRN
  Administered 2017-12-05: 2 mg
  Filled 2017-12-05: qty 2

## 2017-12-05 MED ORDER — ALTEPLASE 2 MG IJ SOLR
INTRAMUSCULAR | Status: AC
  Filled 2017-12-05: qty 2

## 2017-12-05 MED ORDER — SODIUM CHLORIDE 0.9% FLUSH
10.0000 mL | INTRAVENOUS | Status: DC | PRN
  Administered 2017-12-05: 10 mL via INTRAVENOUS
  Filled 2017-12-05: qty 10

## 2017-12-05 NOTE — Progress Notes (Signed)
No blood return from port. CATHFLOW will be administered by an [employer] manager. Lab 2 will draw blood peripherally. Rivkah Wolz LPN

## 2017-12-05 NOTE — Progress Notes (Addendum)
[place] OFFICE PROGRESS NOTE   Diagnosis: Esophagus cancer  INTERVAL HISTORY:   Mr. Chestnut returns as scheduled.  He was last treated with Abraxane/ramucirumab 11/15/2017.  Treatment was held 11/28/2017 due to significant fatigue/malaise.  He overall is feeling much better.  Energy level has improved.  He also notes improvement in his appetite.  He denies nausea.  Bowels are moving.  No bleeding.  He has stable numbness in the fingers and toes.  Objective:  Vital signs in last 24 hours:  Blood pressure (!) 142/84, pulse 88, temperature (!) 97.5 F (36.4 C), temperature source Oral, resp. rate (!) 21, height '5\' 9"'$  (1.753 m), weight 189 lb 14.4 oz (86.1 kg), SpO2 100 %.    HEENT: No thrush or ulcers. Lymphatics: No palpable cervical or supraclavicular lymph nodes. Resp: Lungs clear bilaterally. Cardio: Regular rate and rhythm. GI: Abdomen soft and nontender.  No hepatomegaly. Vascular: No leg edema. Neuro: Alert and oriented. Port-A-Cath without erythema.   Lab Results:  Lab Results  Component Value Date   WBC 9.4 12/05/2017   HGB 11.9 (L) 12/05/2017   HCT 36.5 (L) 12/05/2017   MCV 85.9 12/05/2017   PLT 162 12/05/2017   NEUTROABS 6.8 (H) 12/05/2017    Imaging:  No results found.  Medications: I have reviewed the patient's current medications.  Assessment/Plan: 1. 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 Staging CTs 08/18/2016 with nonspecific pulmonary nodules, liver metastases, borderline thoracic and gastrohepatic ligament lymphadenopathy ? Cycle 1 FOLFOX 09/04/2016 ? Cycle 2 FOLFOX 09/18/2016 ? Cycle 3 FOLFOX 10/02/2016 ? Cycle 4 FOLFOX 10/16/2016 ? Cycle 5 FOLFOX 10/30/2016 ? Restaging CTs 11/10/2016-decreased wall thickness distal esophagus; stable borderline mediastinal lymphadenopathy; multiple liver metastases have completely resolved; stable scattered nonspecific tiny  bilateral lung nodules ? Cycle 6 FOLFOX 11/13/2016 ? Cycle 7 FOLFOX 11/27/2016 ? Cycle 8 FOLFOX 12/11/2016 ? Cycle 9 FOLFOX 12/25/2016 ? Cycle 10 FOLFOX (oxaliplatin deleted) 01/08/2017 ? Initiation of maintenance therapy with 5-FU/leucovorin 02/06/2017 ? CTs 03/16/2017-stable pulmonary nodules, no esophagus mass, 1 subtle liver lesion ? Maintenance therapy continued with 5-FU/leucovorin every 3 weeks beginning 03/20/2017 ? CTs 06/29/2017-progressive liver metastases ? Cycle 1 Taxol/ramucirumab 07/12/2017, allergic reaction to Taxol ? Emergency room with right upper quadrant pain 07/14/2017-CTwithenlargement of liver lesions ? Cycle 1 day 8 07/18/2017-Abraxane substituted for Taxol ? Cycle 2-day 1 Abraxane/ramucirumab 08/09/2017 ? CEA improved 08/09/2017 ? CEA improved2/07/2017 ? Cycle 3-day 1 Abraxane/ramucirumab 09/06/2017;cycle 3-day 15 held due to neutropenia, poor performance status ? CTs 09/27/2017-slight decrease in right supraclavicular lymph nodes, mild increase in size of central right liver lesion, other liver lesions stable, stable small pulmonary nodules, marked narrowing of the SVC with thrombus around the Port-A-Cath ? Abraxane/ramucirumabcontinued on a 2-week schedule 2. Diabetes  3. COPD  4. Hypertension  5. History of colon polyps  6. Port-A-Cath placement 08/30/2016  7. 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  8. Right upper extremity deep vein thrombosis, Port-A-Cath related 08/24/2018started Xarelto 03/16/2017, possible subsegmental right lower lobe pulmonary embolus on CT 03/16/2017  CT 09/27/2017-narrowing of the SVC with thrombus around the Port-A-Cath, extensive chest wall collaterals  9.Gross hematuria 05/24/2017-status post bladder irrigation in the emergency room, referred to urology  10.Depression. Trial of Remeron initiated 09/06/2017.   Disposition: Mr. Hernan appears stable.   He is currently on active treatment with Abraxane/ramucirumab.  Treatment was held last week due to significant fatigue/malaise.  He overall is feeling better.  We reviewed the  CEA result from last week.  He and his daughter understand the level has increased over the past 1 to 2 months.  We are referring him for restaging CT scans.  He will return for a follow-up visit in 1 week to review the results.  He will contact the office in the interim with any problems.  Patient seen with Dr. Benay Spice.    Ned Card ANP/GNP-BC   12/05/2017  1:33 PM  This was a shared visit with Ned Card.  Mr. Heatwole has been maintained on the current therapy for approximately 4 months.  The CEA is higher.  We decided to hold chemotherapy today.  He will return for an office visit after a restaging CT in 1 week.  Julieanne Manson, MD

## 2017-12-10 ENCOUNTER — Ambulatory Visit (HOSPITAL_COMMUNITY)
Admission: RE | Admit: 2017-12-10 | Discharge: 2017-12-10 | Disposition: A | Discharge status: Home / Self Care | Payer: Medicare Other | Source: Ambulatory Visit | Attending: Nurse Practitioner | Admitting: Nurse Practitioner

## 2017-12-10 ENCOUNTER — Encounter (HOSPITAL_COMMUNITY): Payer: Self-pay

## 2017-12-10 DIAGNOSIS — M47812 Spondylosis without myelopathy or radiculopathy, cervical region: Secondary | ICD-10-CM | POA: Diagnosis not present

## 2017-12-10 DIAGNOSIS — M4802 Spinal stenosis, cervical region: Secondary | ICD-10-CM | POA: Diagnosis not present

## 2017-12-10 DIAGNOSIS — C155 Malignant neoplasm of lower third of esophagus: Secondary | ICD-10-CM | POA: Diagnosis not present

## 2017-12-10 DIAGNOSIS — R918 Other nonspecific abnormal finding of lung field: Secondary | ICD-10-CM | POA: Diagnosis not present

## 2017-12-10 DIAGNOSIS — R188 Other ascites: Secondary | ICD-10-CM | POA: Diagnosis not present

## 2017-12-10 DIAGNOSIS — I2510 Atherosclerotic heart disease of native coronary artery without angina pectoris: Secondary | ICD-10-CM | POA: Insufficient documentation

## 2017-12-10 DIAGNOSIS — R590 Localized enlarged lymph nodes: Secondary | ICD-10-CM | POA: Diagnosis not present

## 2017-12-10 DIAGNOSIS — I700 Atherosclerosis of aorta: Secondary | ICD-10-CM | POA: Insufficient documentation

## 2017-12-10 DIAGNOSIS — Z5111 Encounter for antineoplastic chemotherapy: Secondary | ICD-10-CM | POA: Diagnosis not present

## 2017-12-10 DIAGNOSIS — J432 Centrilobular emphysema: Secondary | ICD-10-CM | POA: Insufficient documentation

## 2017-12-10 DIAGNOSIS — C159 Malignant neoplasm of esophagus, unspecified: Secondary | ICD-10-CM | POA: Diagnosis not present

## 2017-12-10 DIAGNOSIS — C773 Secondary and unspecified malignant neoplasm of axilla and upper limb lymph nodes: Secondary | ICD-10-CM | POA: Diagnosis not present

## 2017-12-10 DIAGNOSIS — C787 Secondary malignant neoplasm of liver and intrahepatic bile duct: Secondary | ICD-10-CM | POA: Insufficient documentation

## 2017-12-10 MED ORDER — IOPAMIDOL (ISOVUE-300) INJECTION 61%
INTRAVENOUS | Status: AC
  Filled 2017-12-10: qty 100

## 2017-12-10 MED ORDER — IOPAMIDOL (ISOVUE-300) INJECTION 61%
100.0000 mL | Freq: Once | INTRAVENOUS | Status: AC | PRN
  Administered 2017-12-10: 100 mL via INTRAVENOUS

## 2017-12-12 ENCOUNTER — Telehealth: Payer: Self-pay

## 2017-12-12 ENCOUNTER — Inpatient Hospital Stay: Payer: Medicare Other

## 2017-12-12 ENCOUNTER — Inpatient Hospital Stay (HOSPITAL_BASED_OUTPATIENT_CLINIC_OR_DEPARTMENT_OTHER): Payer: Medicare Other | Admitting: Nurse Practitioner

## 2017-12-12 ENCOUNTER — Encounter: Payer: Self-pay | Admitting: Nurse Practitioner

## 2017-12-12 VITALS — BP 123/84 | HR 81 | Temp 97.7°F | Resp 18 | Ht 69.0 in | Wt 186.8 lb

## 2017-12-12 DIAGNOSIS — Z95828 Presence of other vascular implants and grafts: Secondary | ICD-10-CM

## 2017-12-12 DIAGNOSIS — Z79899 Other long term (current) drug therapy: Secondary | ICD-10-CM | POA: Diagnosis not present

## 2017-12-12 DIAGNOSIS — Z86010 Personal history of colonic polyps: Secondary | ICD-10-CM

## 2017-12-12 DIAGNOSIS — F329 Major depressive disorder, single episode, unspecified: Secondary | ICD-10-CM | POA: Diagnosis not present

## 2017-12-12 DIAGNOSIS — Z86718 Personal history of other venous thrombosis and embolism: Secondary | ICD-10-CM

## 2017-12-12 DIAGNOSIS — Z9221 Personal history of antineoplastic chemotherapy: Secondary | ICD-10-CM | POA: Diagnosis not present

## 2017-12-12 DIAGNOSIS — C787 Secondary malignant neoplasm of liver and intrahepatic bile duct: Secondary | ICD-10-CM | POA: Diagnosis not present

## 2017-12-12 DIAGNOSIS — I10 Essential (primary) hypertension: Secondary | ICD-10-CM | POA: Diagnosis not present

## 2017-12-12 DIAGNOSIS — R109 Unspecified abdominal pain: Secondary | ICD-10-CM

## 2017-12-12 DIAGNOSIS — R5381 Other malaise: Secondary | ICD-10-CM | POA: Diagnosis not present

## 2017-12-12 DIAGNOSIS — J449 Chronic obstructive pulmonary disease, unspecified: Secondary | ICD-10-CM | POA: Diagnosis not present

## 2017-12-12 DIAGNOSIS — C155 Malignant neoplasm of lower third of esophagus: Secondary | ICD-10-CM

## 2017-12-12 DIAGNOSIS — R5383 Other fatigue: Secondary | ICD-10-CM | POA: Diagnosis not present

## 2017-12-12 DIAGNOSIS — Z9225 Personal history of immunosupression therapy: Secondary | ICD-10-CM | POA: Diagnosis not present

## 2017-12-12 DIAGNOSIS — R200 Anesthesia of skin: Secondary | ICD-10-CM | POA: Diagnosis not present

## 2017-12-12 DIAGNOSIS — R918 Other nonspecific abnormal finding of lung field: Secondary | ICD-10-CM

## 2017-12-12 DIAGNOSIS — E119 Type 2 diabetes mellitus without complications: Secondary | ICD-10-CM

## 2017-12-12 DIAGNOSIS — R197 Diarrhea, unspecified: Secondary | ICD-10-CM | POA: Diagnosis not present

## 2017-12-12 LAB — CBC WITH DIFFERENTIAL (CANCER CENTER ONLY)
BASOS PCT: 1 %
Basophils Absolute: 0.1 10*3/uL (ref 0.0–0.1)
EOS ABS: 0.1 10*3/uL (ref 0.0–0.5)
Eosinophils Relative: 1 %
HCT: 36.8 % — ABNORMAL LOW (ref 38.4–49.9)
HEMOGLOBIN: 11.9 g/dL — AB (ref 13.0–17.1)
Lymphocytes Relative: 16 %
Lymphs Abs: 1.9 10*3/uL (ref 0.9–3.3)
MCH: 27.5 pg (ref 27.2–33.4)
MCHC: 32.2 g/dL (ref 32.0–36.0)
MCV: 85.6 fL (ref 79.3–98.0)
Monocytes Absolute: 1.1 10*3/uL — ABNORMAL HIGH (ref 0.1–0.9)
Monocytes Relative: 9 %
Neutro Abs: 9.1 10*3/uL — ABNORMAL HIGH (ref 1.5–6.5)
Neutrophils Relative %: 73 %
Platelet Count: 137 10*3/uL — ABNORMAL LOW (ref 140–400)
RBC: 4.3 MIL/uL (ref 4.20–5.82)
RDW: 19.2 % — AB (ref 11.0–14.6)
WBC Count: 12.4 10*3/uL — ABNORMAL HIGH (ref 4.0–10.3)

## 2017-12-12 LAB — CMP (CANCER CENTER ONLY)
ALK PHOS: 179 U/L — AB (ref 40–150)
ALT: 26 U/L (ref 0–55)
AST: 33 U/L (ref 5–34)
Albumin: 3.4 g/dL — ABNORMAL LOW (ref 3.5–5.0)
Anion gap: 8 (ref 3–11)
BILIRUBIN TOTAL: 0.3 mg/dL (ref 0.2–1.2)
BUN: 10 mg/dL (ref 7–26)
CALCIUM: 9.2 mg/dL (ref 8.4–10.4)
CO2: 24 mmol/L (ref 22–29)
CREATININE: 0.78 mg/dL (ref 0.70–1.30)
Chloride: 106 mmol/L (ref 98–109)
Glucose, Bld: 73 mg/dL (ref 70–140)
Potassium: 3.5 mmol/L (ref 3.5–5.1)
SODIUM: 138 mmol/L (ref 136–145)
Total Protein: 7.2 g/dL (ref 6.4–8.3)

## 2017-12-12 LAB — TOTAL PROTEIN, URINE DIPSTICK: Protein, ur: NEGATIVE mg/dL

## 2017-12-12 LAB — CEA (IN HOUSE-CHCC): CEA (CHCC-In House): 164.81 ng/mL — ABNORMAL HIGH (ref 0.00–5.00)

## 2017-12-12 MED ORDER — HEPARIN SOD (PORK) LOCK FLUSH 100 UNIT/ML IV SOLN
500.0000 [IU] | Freq: Once | INTRAVENOUS | Status: AC | PRN
  Administered 2017-12-12: 500 [IU] via INTRAVENOUS
  Filled 2017-12-12: qty 5

## 2017-12-12 MED ORDER — ALTEPLASE 2 MG IJ SOLR
2.0000 mg | Freq: Once | INTRAMUSCULAR | Status: AC | PRN
  Administered 2017-12-12: 2 mg
  Filled 2017-12-12: qty 2

## 2017-12-12 MED ORDER — SODIUM CHLORIDE 0.9% FLUSH
10.0000 mL | INTRAVENOUS | Status: DC | PRN
  Administered 2017-12-12: 10 mL via INTRAVENOUS
  Filled 2017-12-12: qty 10

## 2017-12-12 NOTE — Progress Notes (Addendum)
[place] OFFICE PROGRESS NOTE   Diagnosis: Esophagus cancer  INTERVAL HISTORY:   Justin [name] returns as scheduled.  Energy level continues to be improved.  Overall he has a good appetite.  He continues to have intermittent right-sided chest/abdominal pain.  He takes hydrocodone as needed, usually no more than 1 time a day.  No nausea or vomiting.  Bowels are moving.  Objective:  Vital signs in last 24 hours:  Blood pressure 123/84, pulse 81, temperature 97.7 F (36.5 C), temperature source Oral, resp. rate 18, height _0  (1.753 m), weight 186 lb 12.8 oz (84.7 kg), SpO2 100 %.    HEENT: No thrush or ulcers. Resp: Distant breath sounds, scattered wheezes.  No respiratory distress. Cardio: Regular rate and rhythm. GI: Abdomen soft and nontender.  No hepatomegaly. Vascular: No leg edema.  Port-A-Cath without erythema.  Lab Results:  Lab Results  Component Value Date   WBC 12.4 (H) 12/12/2017   HGB 11.9 (L) 12/12/2017   HCT 36.8 (L) 12/12/2017   MCV 85.6 12/12/2017   PLT 137 (L) 12/12/2017   NEUTROABS 9.1 (H) 12/12/2017    Imaging:  No results found.  Medications: I have reviewed the patient's current medications.  Assessment/Plan: 1. 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 Staging CTs 08/18/2016 with nonspecific pulmonary nodules, liver metastases, borderline thoracic and gastrohepatic ligament lymphadenopathy ? Cycle 1 FOLFOX 09/04/2016 ? Cycle 2 FOLFOX 09/18/2016 ? Cycle 3 FOLFOX 10/02/2016 ? Cycle 4 FOLFOX 10/16/2016 ? Cycle 5 FOLFOX 10/30/2016 ? Restaging CTs 11/10/2016-decreased wall thickness distal esophagus; stable borderline mediastinal lymphadenopathy; multiple liver metastases have completely resolved; stable scattered nonspecific tiny bilateral lung nodules ? Cycle 6 FOLFOX 11/13/2016 ? Cycle 7 FOLFOX 11/27/2016 ? Cycle 8 FOLFOX 12/11/2016 ? Cycle 9 FOLFOX  12/25/2016 ? Cycle 10 FOLFOX (oxaliplatin deleted) 01/08/2017 ? Initiation of maintenance therapy with 5-FU/leucovorin 02/06/2017 ? CTs 03/16/2017-stable pulmonary nodules, no esophagus mass, 1 subtle liver lesion ? Maintenance therapy continued with 5-FU/leucovorin every 3 weeks beginning 03/20/2017 ? CTs 06/29/2017-progressive liver metastases ? Cycle 1 Taxol/ramucirumab 07/12/2017, allergic reaction to Taxol ? Emergency room with right upper quadrant pain 07/14/2017-CTwithenlargement of liver lesions ? Cycle 1 day 8 07/18/2017-Abraxane substituted for Taxol ? Cycle 2-day 1 Abraxane/ramucirumab 08/09/2017 ? CEA improved 08/09/2017 ? CEA improved2/07/2017 ? Cycle 3-day 1 Abraxane/ramucirumab 09/06/2017;cycle 3-day 15 held due to neutropenia, poor performance status ? CTs 09/27/2017-slight decrease in right supraclavicular lymph nodes, mild increase in size of central right liver lesion, other liver lesions stable, stable small pulmonary nodules, marked narrowing of the SVC with thrombus around the Port-A-Cath ? Abraxane/ramucirumabcontinued on a 2-week schedule ? Restaging CTs 12/10/2017- stable appearance of the neck with multiple prominent neck lymph nodes without disease progression; similar to slight increase in thoracic adenopathy; progression of liver metastases; development of small volume abdominopelvic ascites; enlarging porta hepatis nodes. 2. Diabetes  3. COPD  4. Hypertension  5. History of colon polyps  6. Port-A-Cath placement 08/30/2016  7. 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  8. Right upper extremity deep vein thrombosis, Port-A-Cath related 08/24/2018started Xarelto 03/16/2017, possible subsegmental right lower lobe pulmonary embolus on CT 03/16/2017  CT 09/27/2017-narrowing of the SVC with thrombus around the Port-A-Cath, extensive chest wall collaterals  9.Gross hematuria 05/24/2017-status post  bladder irrigation in the emergency room, referred to urology  10.Depression. Trial of Remeron initiated 09/06/2017.    Disposition: Justin [name] appears unchanged.  The recent restaging CT scans show evidence  of progression.  Dr. Benay Spice reviewed the images with Justin [name] and his family at today's visit.  Abraxane/ramucirumab will be discontinued.  We have requested PD1 and MSI testing to see if he is a candidate for immunotherapy.  If he is not a candidate for immunotherapy Dr. Benay Spice recommends Frankey Poot.  We also discussed a supportive care approach with a referral to the hospice program.  [name] indicates he is interested in systemic therapy.  He will return for a follow-up visit on 12/19/2017 to review the PD1 and MSI results and establish a treatment plan.  He will contact the office in the interim with any problems.  Patient seen with Dr. Benay Spice.  25 minutes were spent face-to-face at today's visit with the majority of that time involved in counseling/coordination of care.    Ned Card ANP/GNP-BC   12/12/2017  2:30 PM  This was a shared visit with Ned Card.  We reviewed the CT images and discussed treatment options with Justin [name] and his family.  Julieanne Manson , MD

## 2017-12-12 NOTE — Progress Notes (Signed)
Port-A-Cath accessed at 1300 using sterile tech. No blood return noted. Flushes well with NS, no swelling or pain noted upon saline flush. Cath-Flo instilled per policy. Site marked. Will monitor for blood return.

## 2017-12-12 NOTE — Telephone Encounter (Signed)
Printed avs and calender of upcoming appointment. Per 5/22 los 

## 2017-12-13 ENCOUNTER — Other Ambulatory Visit (HOSPITAL_COMMUNITY)
Admission: RE | Admit: 2017-12-13 | Discharge: 2017-12-13 | Disposition: A | Discharge status: Home / Self Care | Payer: Medicare Other | Source: Ambulatory Visit | Attending: Oncology | Admitting: Oncology

## 2017-12-13 DIAGNOSIS — C159 Malignant neoplasm of esophagus, unspecified: Secondary | ICD-10-CM | POA: Diagnosis not present

## 2017-12-19 ENCOUNTER — Telehealth: Payer: Self-pay | Admitting: Pharmacist

## 2017-12-19 ENCOUNTER — Inpatient Hospital Stay (HOSPITAL_BASED_OUTPATIENT_CLINIC_OR_DEPARTMENT_OTHER): Payer: Medicare Other | Admitting: Oncology

## 2017-12-19 ENCOUNTER — Telehealth: Payer: Self-pay | Admitting: Pharmacy Technician

## 2017-12-19 ENCOUNTER — Telehealth: Payer: Self-pay

## 2017-12-19 ENCOUNTER — Encounter: Payer: Self-pay | Admitting: Oncology

## 2017-12-19 VITALS — BP 143/75 | HR 82 | Temp 97.7°F | Resp 18 | Ht 69.0 in | Wt 184.4 lb

## 2017-12-19 DIAGNOSIS — R109 Unspecified abdominal pain: Secondary | ICD-10-CM | POA: Diagnosis not present

## 2017-12-19 DIAGNOSIS — Z86718 Personal history of other venous thrombosis and embolism: Secondary | ICD-10-CM | POA: Diagnosis not present

## 2017-12-19 DIAGNOSIS — Z9221 Personal history of antineoplastic chemotherapy: Secondary | ICD-10-CM | POA: Diagnosis not present

## 2017-12-19 DIAGNOSIS — Z79899 Other long term (current) drug therapy: Secondary | ICD-10-CM

## 2017-12-19 DIAGNOSIS — I10 Essential (primary) hypertension: Secondary | ICD-10-CM

## 2017-12-19 DIAGNOSIS — C787 Secondary malignant neoplasm of liver and intrahepatic bile duct: Secondary | ICD-10-CM

## 2017-12-19 DIAGNOSIS — R197 Diarrhea, unspecified: Secondary | ICD-10-CM | POA: Diagnosis not present

## 2017-12-19 DIAGNOSIS — C155 Malignant neoplasm of lower third of esophagus: Secondary | ICD-10-CM | POA: Diagnosis not present

## 2017-12-19 DIAGNOSIS — E119 Type 2 diabetes mellitus without complications: Secondary | ICD-10-CM | POA: Diagnosis not present

## 2017-12-19 DIAGNOSIS — R918 Other nonspecific abnormal finding of lung field: Secondary | ICD-10-CM | POA: Diagnosis not present

## 2017-12-19 DIAGNOSIS — R5381 Other malaise: Secondary | ICD-10-CM | POA: Diagnosis not present

## 2017-12-19 DIAGNOSIS — F329 Major depressive disorder, single episode, unspecified: Secondary | ICD-10-CM

## 2017-12-19 DIAGNOSIS — R5383 Other fatigue: Secondary | ICD-10-CM | POA: Diagnosis not present

## 2017-12-19 DIAGNOSIS — J449 Chronic obstructive pulmonary disease, unspecified: Secondary | ICD-10-CM

## 2017-12-19 DIAGNOSIS — Z86010 Personal history of colonic polyps: Secondary | ICD-10-CM | POA: Diagnosis not present

## 2017-12-19 DIAGNOSIS — Z9225 Personal history of immunosupression therapy: Secondary | ICD-10-CM

## 2017-12-19 DIAGNOSIS — R200 Anesthesia of skin: Secondary | ICD-10-CM | POA: Diagnosis not present

## 2017-12-19 MED ORDER — HYDROCODONE-ACETAMINOPHEN 5-325 MG PO TABS: 1.0000 | ORAL_TABLET | ORAL | 0 refills | Status: DC | PRN

## 2017-12-19 NOTE — Telephone Encounter (Signed)
Oral Oncology Pharmacist Encounter  Received new referral for Lonsurf (trifluridine and tipiracil) for the treatment of metastatic, previously treated esophagogastric adenocarcinoma, planned duration until disease progression or unacceptable toxicity.  Labs from 12/12/17 assessed, OK for treatment.  Current medication list in Epic reviewed, no DDIs with L[facility] identified.  Prescription will be sent to appropriate specialty pharmacy once obtained from MD. Insurance authorization is required and will be submitted.  Oral Oncology Clinic will continue to follow for insurance authorization, copayment issues, initial counseling and start date.  Johny Drilling, PharmD, BCPS, BCOP  12/19/2017 11:10 AM Oral Oncology Clinic [phone]

## 2017-12-19 NOTE — Telephone Encounter (Signed)
Printed avs and calender of upcoming appointment. Per 5/28 los 

## 2017-12-19 NOTE — Progress Notes (Signed)
[place] OFFICE PROGRESS NOTE   Diagnosis: Esophagus cancer  INTERVAL HISTORY:   [name] returns as scheduled.  He continues to have mild discomfort at the right upper abdomen near the costal margin.  The pain is relieved with hydrocodone.  No other complaint.  Objective:  Vital signs in last 24 hours:  Blood pressure (!) 143/75, pulse 82, temperature 97.7 F (36.5 C), temperature source Oral, resp. rate 18, height 5' 9"  (1.753 m), weight 184 lb 6.4 oz (83.6 kg), SpO2 98 %.    Resp: Scattered inspiratory/expiratory rhonchi, no respiratory distress Cardio: Regular rate and rhythm GI: No hepatomegaly, mild tenderness in the right upper abdomen, no mass Vascular: The left lower leg is slightly larger than the right side   Portacath/PICC-without erythema  Lab Results:  Lab Results  Component Value Date   WBC 12.4 (H) 12/12/2017   HGB 11.9 (L) 12/12/2017   HCT 36.8 (L) 12/12/2017   MCV 85.6 12/12/2017   PLT 137 (L) 12/12/2017   NEUTROABS 9.1 (H) 12/12/2017    CMP  Lab Results  Component Value Date   NA 138 12/12/2017   K 3.5 12/12/2017   CL 106 12/12/2017   CO2 24 12/12/2017   GLUCOSE 73 12/12/2017   BUN 10 12/12/2017   CREATININE 0.78 12/12/2017   CALCIUM 9.2 12/12/2017   PROT 7.2 12/12/2017   ALBUMIN 3.4 (L) 12/12/2017   AST 33 12/12/2017   ALT 26 12/12/2017   ALKPHOS 179 (H) 12/12/2017   BILITOT 0.3 12/12/2017   GFRNONAA >60 12/12/2017   GFRAA >60 12/12/2017    Lab Results  Component Value Date   CEA1 164.81 (H) 12/12/2017    Lab Results  Component Value Date   INR 1.92 07/14/2017    Medications: I have reviewed the patient's current medications.   Assessment/Plan:  1. 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 Staging CTs 08/18/2016 with nonspecific pulmonary nodules, liver metastases, borderline thoracic and gastrohepatic ligament lymphadenopathy ? Cycle 1  FOLFOX 09/04/2016 ? Cycle 2 FOLFOX 09/18/2016 ? Cycle 3 FOLFOX 10/02/2016 ? Cycle 4 FOLFOX 10/16/2016 ? Cycle 5 FOLFOX 10/30/2016 ? Restaging CTs 11/10/2016-decreased wall thickness distal esophagus; stable borderline mediastinal lymphadenopathy; multiple liver metastases have completely resolved; stable scattered nonspecific tiny bilateral lung nodules ? Cycle 6 FOLFOX 11/13/2016 ? Cycle 7 FOLFOX 11/27/2016 ? Cycle 8 FOLFOX 12/11/2016 ? Cycle 9 FOLFOX 12/25/2016 ? Cycle 10 FOLFOX (oxaliplatin deleted) 01/08/2017 ? Initiation of maintenance therapy with 5-FU/leucovorin 02/06/2017 ? CTs 03/16/2017-stable pulmonary nodules, no esophagus mass, 1 subtle liver lesion ? Maintenance therapy continued with 5-FU/leucovorin every 3 weeks beginning 03/20/2017 ? CTs 06/29/2017-progressive liver metastases ? Cycle 1 Taxol/ramucirumab 07/12/2017, allergic reaction to Taxol ? Emergency room with right upper quadrant pain 07/14/2017-CTwithenlargement of liver lesions ? Cycle 1 day 8 07/18/2017-Abraxane substituted for Taxol ? Cycle 2-day 1 Abraxane/ramucirumab 08/09/2017 ? CEA improved 08/09/2017 ? CEA improved2/07/2017 ? Cycle 3-day 1 Abraxane/ramucirumab 09/06/2017;cycle 3-day 15 held due to neutropenia, poor performance status ? CTs 09/27/2017-slight decrease in right supraclavicular lymph nodes, mild increase in size of central right liver lesion, other liver lesions stable, stable small pulmonary nodules, marked narrowing of the SVC with thrombus around the Port-A-Cath ? Abraxane/ramucirumabcontinued on a 2-week schedule ? Restaging CTs 12/10/2017- stable appearance of the neck with multiple prominent neck lymph nodes without disease progression; similar to slight increase in thoracic adenopathy; progression of liver metastases; development of small volume abdominopelvic ascites; enlarging porta hepatis nodes. 2. Diabetes  3. COPD  4. Hypertension  5. History of colon polyps  6.  Port-A-Cath placement 08/30/2016  7. 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  8. Right upper extremity deep vein thrombosis, Port-A-Cath related 08/24/2018started Xarelto 03/16/2017, possible subsegmental right lower lobe pulmonary embolus on CT 03/16/2017  CT 09/27/2017-narrowing of the SVC with thrombus around the Port-A-Cath, extensive chest wall collaterals  9.Gross hematuria 05/24/2017-status post bladder irrigation in the emergency room, referred to urology  10.Depression. Trial of Remeron initiated 09/06/2017.    Disposition: [name] appears unchanged.  We are waiting on MSI and PD1 testing on the esophagus biopsy.  The plan is to initiate immunotherapy if the MSI returns high or if there is a high PD1 score.  If not, he will begin Lonsurf.  He does not wish to pursue a supportive care approach at present.  We reviewed potential toxicities associated with Lonsurf and he met with the Cancer center pharmacist today.  The plan is to initiate treatment on 12/31/2017.  He will return for an office and lab visit on 01/30/2018.  25 minutes were spent with the patient today.  The majority of the time was used for counseling and coordination of care.  Betsy Coder, MD  12/19/2017  10:47 AM

## 2017-12-19 NOTE — Telephone Encounter (Signed)
Oral Chemotherapy Pharmacist Encounter     I spoke with patient and daughter in exam room for overview of: Lonsurf (trifluridine/tipiracil).    Counseled patient on administration, dosing, side effects, monitoring, drug-food interactions, safe handling, storage, and disposal.   Patient will take Lonsurf 20mg  (trifluridine component) tablets, 4 tablets (80mg  trifluridine) by mouth in AM and 3 tablets (60mg  trifluridine) in PM, within 1 hour hour of finishing meals, on days 1-5 and days 8-12, every 28 days.  Patient plans to take Lonsurf M-F, Saturday and Sunday off days, Lonsurf M-F again, then no tablets for the next 2 weeks. Patient will start D1 of each cycle on Mondays.  Lonsurf start date: planned for 12/31/17   Adverse effects include but are not limited to: fatigue, nausea, vomiting, diarrhea, and decreased blood counts.    Patient does not have anti-emetic on hand and knows to call office to request prescription if nausea develops.   Patient will obtain anti diarrheal and alert the office of 4 or more loose stools above baseline.  Patient updated about CBC check on Cycle 1 Day 14.   Reviewed with patient importance of keeping a medication schedule and plan for any missed doses.   Justin [name] voiced understanding and appreciation.    All questions answered.  Medication reconciliation performed and medication/allergy list updated.  Patient and daughter informed that insurance authorization is required and has been submitted. Once authorization is obtained, we will have pharmacy run test claim for copayment.  We extensively discussed Medicare Part D copayment structure. We also discussed options for foundation grant assistance and the manufacturer compassionate use process.   Patient knows to call the office with questions or concerns. Oral Oncology Clinic will continue to follow.   Thank you,   Johny Drilling, PharmD, BCPS, BCOP  12/19/2017   11:58 AM Oral Oncology  Clinic [phone]

## 2017-12-19 NOTE — Telephone Encounter (Signed)
Oral Oncology Patient Advocate Encounter  Received notification from [facility] that prior authorization for [place] is required.  PA submitted on CoverMyMeds Key [id number] Status is pending  Oral Oncology Clinic will continue to follow.  Justin [name]. Melynda Keller, [place] Patient [place] [phone] 12/19/2017 12:05 PM

## 2017-12-19 NOTE — Telephone Encounter (Signed)
Oral Oncology Patient Advocate Encounter  Prior Authorization for Frankey Poot has been approved.    PA# [id number] Effective dates: 12/19/2017 through 07/23/2018  Oral Oncology Clinic will continue to follow.   Fabio Asa. Melynda Keller, [place] Patient [place] [phone] 12/19/2017 3:53 PM

## 2017-12-20 NOTE — Telephone Encounter (Signed)
Oral Oncology Patient Advocate Encounter  Patient's copayment for the initial month of therapy is $2030.26.    I have left a voicemail for the patient's daughter, Lattie Haw, offering to provide an update on the copay as well to discuss the ways that we work to acquire the medication.   I will continue to provide updates.  Fabio Asa. Melynda Keller, [place] Patient [place] [phone] 12/20/2017 10:17 AM

## 2017-12-20 NOTE — Telephone Encounter (Signed)
Oral Oncology Patient Advocate Encounter  I spoke with Lattie Haw, the patient's daughter.  She will be coming to the cancer center tomorrow 12/21/2017 to bring Mr. Kolenda' income documents and fill out an application for manufacturer Assistance.    This information will be updated in a separate encounter.   Fabio Asa. Melynda Keller, [place] Patient [place] [phone] 12/20/2017 4:28 PM

## 2017-12-21 ENCOUNTER — Telehealth: Payer: Self-pay | Admitting: Pharmacist

## 2017-12-21 NOTE — Telephone Encounter (Signed)
Oral Oncology Pharmacist Encounter  I met patient's stepdaughter in [facility] to complete manufacturer assistance application.  Application for manufacturer assistance for Lonsurf faxed to [place] at [phone]  This encounter will continue to be updated until final determination.  Oral Oncology Clinic will continue to follow.  Johny Drilling, PharmD, BCPS, BCOP  12/21/2017 4:03 PM Oral Oncology Clinic [phone]

## 2017-12-24 NOTE — Telephone Encounter (Signed)
Oral Oncology Pharmacist Encounter  Received call from patient's daughter, Lattie Haw, that they had heard from [place] to verify income. They will let us know of further communication from manufacturer.  Oral Oncology Clinic will continue to follow.  Johny Drilling, PharmD, BCPS, BCOP  12/24/2017 3:46 PM Oral Oncology Clinic [phone]

## 2017-12-24 NOTE — Telephone Encounter (Signed)
Oral Oncology Pharmacist Encounter  Received notification from [facility] On[facility] Patient Support that application sent on Friday was missing diagnosis.  Section 6 of application completed. Application recent to [place].  I called and confirmed that they had received application. They have received completed application, and performed benefits investigation.  They will reach out to patient today to confirm income. They will then reach out to the office to fill in the income section of application with confirmed number. Application will be resent with updated income information at that time.  Oral Oncology Clinic will continue to follow.  Johny Drilling, PharmD, BCPS, BCOP  12/24/2017 2:28 PM Oral Oncology Clinic [phone]

## 2017-12-27 NOTE — Telephone Encounter (Signed)
I cannot find the PD1 and MSI testing we ordered 3 weeks ago 

## 2017-12-27 NOTE — Telephone Encounter (Signed)
Oral Oncology Patient Advocate Encounter  Received notification from [place] Patient Assistance program that patient has been successfully enrolled into their program to receive Lonsurf from the manufacturer at $0 out of pocket until 07/23/2018.   I called and spoke with patient's daughter, Lattie Haw.  We will contact [facility] on 12/28/17 to check on the status of the patients prescription.  Patient knows to call the office with questions or concerns.  Oral Oncology Clinic will continue to follow.  [place] Patient [place] Phone [phone] Fax [phone] 12/27/2017 9:55 AM

## 2017-12-28 ENCOUNTER — Other Ambulatory Visit (HOSPITAL_COMMUNITY)
Admission: RE | Admit: 2017-12-28 | Discharge: 2017-12-28 | Disposition: A | Discharge status: Home / Self Care | Payer: Medicare Other | Source: Ambulatory Visit | Attending: Oncology | Admitting: Oncology

## 2017-12-28 ENCOUNTER — Encounter: Payer: Self-pay | Admitting: Emergency Medicine

## 2017-12-28 DIAGNOSIS — K2980 Duodenitis without bleeding: Secondary | ICD-10-CM | POA: Insufficient documentation

## 2017-12-28 DIAGNOSIS — K449 Diaphragmatic hernia without obstruction or gangrene: Secondary | ICD-10-CM | POA: Diagnosis not present

## 2017-12-28 DIAGNOSIS — K2970 Gastritis, unspecified, without bleeding: Secondary | ICD-10-CM | POA: Diagnosis not present

## 2017-12-28 DIAGNOSIS — C155 Malignant neoplasm of lower third of esophagus: Secondary | ICD-10-CM | POA: Insufficient documentation

## 2017-12-28 DIAGNOSIS — R1310 Dysphagia, unspecified: Secondary | ICD-10-CM | POA: Insufficient documentation

## 2017-12-28 DIAGNOSIS — R634 Abnormal weight loss: Secondary | ICD-10-CM | POA: Insufficient documentation

## 2017-12-28 NOTE — Progress Notes (Signed)
Called [facility] to check status of PDl1 and MSI testing. PDL1 should be back week of 6/10. MSI testing was never ordered. Ordered the testing and will be back in 7 days from today. MD made aware.

## 2017-12-31 NOTE — Telephone Encounter (Signed)
Oral Oncology Patient Advocate Encounter  Received confirmation from [place] that the patient's initial shipment of Lonsurf was delivered to his home on 12/29/2017.   Fabio Asa. Melynda Keller, [place] Patient [place] [phone] 12/31/2017 1:59 PM

## 2018-01-16 ENCOUNTER — Other Ambulatory Visit: Payer: Self-pay | Admitting: Oncology

## 2018-01-23 ENCOUNTER — Inpatient Hospital Stay: Payer: Medicare Other

## 2018-01-23 ENCOUNTER — Telehealth: Payer: Self-pay

## 2018-01-23 ENCOUNTER — Inpatient Hospital Stay: Payer: Medicare Other | Attending: Oncology | Admitting: Nurse Practitioner

## 2018-01-23 ENCOUNTER — Encounter: Payer: Self-pay | Admitting: Nurse Practitioner

## 2018-01-23 VITALS — BP 129/80 | HR 86 | Temp 97.9°F | Resp 17 | Ht 69.0 in | Wt 178.8 lb

## 2018-01-23 DIAGNOSIS — C155 Malignant neoplasm of lower third of esophagus: Secondary | ICD-10-CM | POA: Diagnosis not present

## 2018-01-23 DIAGNOSIS — R918 Other nonspecific abnormal finding of lung field: Secondary | ICD-10-CM | POA: Insufficient documentation

## 2018-01-23 DIAGNOSIS — E119 Type 2 diabetes mellitus without complications: Secondary | ICD-10-CM | POA: Diagnosis not present

## 2018-01-23 DIAGNOSIS — Z452 Encounter for adjustment and management of vascular access device: Secondary | ICD-10-CM | POA: Diagnosis not present

## 2018-01-23 DIAGNOSIS — Z9221 Personal history of antineoplastic chemotherapy: Secondary | ICD-10-CM | POA: Insufficient documentation

## 2018-01-23 DIAGNOSIS — R079 Chest pain, unspecified: Secondary | ICD-10-CM | POA: Diagnosis not present

## 2018-01-23 DIAGNOSIS — J449 Chronic obstructive pulmonary disease, unspecified: Secondary | ICD-10-CM | POA: Insufficient documentation

## 2018-01-23 DIAGNOSIS — C787 Secondary malignant neoplasm of liver and intrahepatic bile duct: Secondary | ICD-10-CM | POA: Diagnosis not present

## 2018-01-23 DIAGNOSIS — F329 Major depressive disorder, single episode, unspecified: Secondary | ICD-10-CM | POA: Insufficient documentation

## 2018-01-23 DIAGNOSIS — Z86010 Personal history of colonic polyps: Secondary | ICD-10-CM | POA: Insufficient documentation

## 2018-01-23 DIAGNOSIS — Z79899 Other long term (current) drug therapy: Secondary | ICD-10-CM | POA: Insufficient documentation

## 2018-01-23 DIAGNOSIS — I10 Essential (primary) hypertension: Secondary | ICD-10-CM | POA: Insufficient documentation

## 2018-01-23 DIAGNOSIS — R109 Unspecified abdominal pain: Secondary | ICD-10-CM | POA: Insufficient documentation

## 2018-01-23 DIAGNOSIS — Z95828 Presence of other vascular implants and grafts: Secondary | ICD-10-CM

## 2018-01-23 DIAGNOSIS — Z86718 Personal history of other venous thrombosis and embolism: Secondary | ICD-10-CM | POA: Diagnosis not present

## 2018-01-23 LAB — CMP (CANCER CENTER ONLY)
ALK PHOS: 250 U/L — AB (ref 38–126)
ALT: 25 U/L (ref 0–44)
ANION GAP: 6 (ref 5–15)
AST: 25 U/L (ref 15–41)
Albumin: 3.5 g/dL (ref 3.5–5.0)
BILIRUBIN TOTAL: 0.5 mg/dL (ref 0.3–1.2)
BUN: 8 mg/dL (ref 8–23)
CALCIUM: 9.3 mg/dL (ref 8.9–10.3)
CO2: 25 mmol/L (ref 22–32)
Chloride: 106 mmol/L (ref 98–111)
Creatinine: 0.68 mg/dL (ref 0.61–1.24)
GLUCOSE: 80 mg/dL (ref 70–99)
POTASSIUM: 3.7 mmol/L (ref 3.5–5.1)
Sodium: 137 mmol/L (ref 135–145)
TOTAL PROTEIN: 7.2 g/dL (ref 6.5–8.1)

## 2018-01-23 LAB — CBC WITH DIFFERENTIAL (CANCER CENTER ONLY)
BASOS ABS: 0.1 10*3/uL (ref 0.0–0.1)
BASOS PCT: 1 %
Eosinophils Absolute: 0.2 10*3/uL (ref 0.0–0.5)
Eosinophils Relative: 3 %
HEMATOCRIT: 33.6 % — AB (ref 38.4–49.9)
Hemoglobin: 10.8 g/dL — ABNORMAL LOW (ref 13.0–17.1)
LYMPHS PCT: 20 %
Lymphs Abs: 1.3 10*3/uL (ref 0.9–3.3)
MCH: 26.8 pg — ABNORMAL LOW (ref 27.2–33.4)
MCHC: 32.2 g/dL (ref 32.0–36.0)
MCV: 83.3 fL (ref 79.3–98.0)
MONO ABS: 0.9 10*3/uL (ref 0.1–0.9)
Monocytes Relative: 14 %
NEUTROS ABS: 4.2 10*3/uL (ref 1.5–6.5)
NEUTROS PCT: 62 %
Platelet Count: 192 10*3/uL (ref 140–400)
RBC: 4.03 MIL/uL — ABNORMAL LOW (ref 4.20–5.82)
RDW: 20.9 % — ABNORMAL HIGH (ref 11.0–14.6)
WBC: 6.7 10*3/uL (ref 4.0–10.3)

## 2018-01-23 MED ORDER — HEPARIN SOD (PORK) LOCK FLUSH 100 UNIT/ML IV SOLN
500.0000 [IU] | Freq: Once | INTRAVENOUS | Status: AC | PRN
  Administered 2018-01-23: 500 [IU] via INTRAVENOUS
  Filled 2018-01-23: qty 5

## 2018-01-23 MED ORDER — SODIUM CHLORIDE 0.9% FLUSH
10.0000 mL | INTRAVENOUS | Status: DC | PRN
  Administered 2018-01-23: 10 mL via INTRAVENOUS
  Filled 2018-01-23: qty 10

## 2018-01-23 MED ORDER — HYDROCODONE-ACETAMINOPHEN 5-325 MG PO TABS: 1.0000 | ORAL_TABLET | ORAL | 0 refills | Status: DC | PRN

## 2018-01-23 NOTE — Telephone Encounter (Signed)
Printed avs and calender of upcoming appointment. Per 7/3 los 

## 2018-01-23 NOTE — Progress Notes (Addendum)
[name]   Diagnosis: Esophagus cancer  INTERVAL HISTORY:   Justin [name] returns for follow-up.  He completed cycle 1 Lonsurf getting 12/31/2017.  He denies nausea/vomiting.  No mouth sores.  No diarrhea.  He overall is feeling better.  He reports a good appetite.  He continues to have intermittent pain along the right chest/abdomen.  He takes hydrocodone as needed.  Objective:  Vital signs in last 24 hours:  Blood pressure 129/80, pulse 86, temperature 97.9 F (36.6 C), temperature source Oral, resp. rate 17, height _0  (1.753 m), weight 178 lb 12.8 oz (81.1 kg), SpO2 96 %.    HEENT: No thrush or ulcers. Lymphatics: No palpable cervical or supraclavicular lymph nodes. Resp: Bilateral inspiratory rhonchi.  No respiratory distress. Cardio: Regular rate and rhythm. GI: Abdomen soft and nontender.  No hepatomegaly. Vascular: No leg edema.  Port-A-Cath without erythema.  Lab Results:  Lab Results  Component Value Date   WBC 6.7 01/23/2018   HGB 10.8 (L) 01/23/2018   HCT 33.6 (L) 01/23/2018   MCV 83.3 01/23/2018   PLT 192 01/23/2018   NEUTROABS 4.2 01/23/2018    Imaging:  No results found.  Medications: I have reviewed the patient's current medications.  Assessment/Plan: 1. 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 Staging CTs 08/18/2016 with nonspecific pulmonary nodules, liver metastases, borderline thoracic and gastrohepatic ligament lymphadenopathy ? Cycle 1 FOLFOX 09/04/2016 ? Cycle 2 FOLFOX 09/18/2016 ? Cycle 3 FOLFOX 10/02/2016 ? Cycle 4 FOLFOX 10/16/2016 ? Cycle 5 FOLFOX 10/30/2016 ? Restaging CTs 11/10/2016-decreased wall thickness distal esophagus; stable borderline mediastinal lymphadenopathy; multiple liver metastases have completely resolved; stable scattered nonspecific tiny bilateral lung nodules ? Cycle 6  FOLFOX 11/13/2016 ? Cycle 7 FOLFOX 11/27/2016 ? Cycle 8 FOLFOX 12/11/2016 ? Cycle 9 FOLFOX 12/25/2016 ? Cycle 10 FOLFOX (oxaliplatin deleted) 01/08/2017 ? Initiation of maintenance therapy with 5-FU/leucovorin 02/06/2017 ? CTs 03/16/2017-stable pulmonary nodules, no esophagus mass, 1 subtle liver lesion ? Maintenance therapy continued with 5-FU/leucovorin every 3 weeks beginning 03/20/2017 ? CTs 06/29/2017-progressive liver metastases ? Cycle 1 Taxol/ramucirumab 07/12/2017, allergic reaction to Taxol ? Emergency room with right upper quadrant pain 07/14/2017-CTwithenlargement of liver lesions ? Cycle 1 day 8 07/18/2017-Abraxane substituted for Taxol ? Cycle 2-day 1 Abraxane/ramucirumab 08/09/2017 ? CEA improved 08/09/2017 ? CEA improved2/07/2017 ? Cycle 3-day 1 Abraxane/ramucirumab 09/06/2017;cycle 3-day 15 held due to neutropenia, poor performance status ? CTs 09/27/2017-slight decrease in right supraclavicular lymph nodes, mild increase in size of central right liver lesion, other liver lesions stable, stable small pulmonary nodules, marked narrowing of the SVC with thrombus around the Port-A-Cath ? Abraxane/ramucirumabcontinued on a 2-week schedule ? Restaging CTs 12/10/2017- stable appearance of the neck with multiple prominent neck lymph nodes without disease progression; similar to slight increase in thoracic adenopathy; progression of liver metastases; development of small volume abdominopelvic ascites; enlarging porta hepatis nodes. ? Cycle 1 Lonsurf 12/31/2017 ? Cycle 2 Lonsurf 01/28/2018 2. Diabetes  3. COPD  4. Hypertension  5. History of colon polyps  6. Port-A-Cath placement 08/30/2016  7. 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  8. Right upper extremity deep vein thrombosis, Port-A-Cath related 08/24/2018started Xarelto 03/16/2017, possible subsegmental right lower lobe pulmonary embolus on CT 03/16/2017  CT  09/27/2017-narrowing of the SVC with thrombus around the Port-A-Cath, extensive chest wall collaterals  9.Gross hematuria 05/24/2017-status post bladder irrigation in the emergency room, referred to urology  10.Depression.  Trial of Remeron initiated 09/06/2017.   Disposition: Justin [name] appears stable.  He has completed 1 cycle of Lonsurf.  He seems to have tolerated this well.  Plan to proceed with cycle 2 as scheduled beginning 01/28/2018.  We reviewed the MSI and PD-L1 results.  He understands he is not a candidate for immunotherapy based on these results.  He will return for lab and follow-up around 02/18/2018.  He will contact the office in the interim with any problems.  Patient seen with Dr. Benay Spice.  Ned Card ANP/GNP-BC   01/23/2018  4:02 PM  This was a shared visit with Ned Card.  [name] appears to be tolerating the Pulcifer well.  He will not be a candidate for immunotherapy based on the MSI and PD-L1 results.  Julieanne Manson, MD

## 2018-01-25 LAB — CEA (IN HOUSE-CHCC): CEA (CHCC-In House): 215.56 ng/mL — ABNORMAL HIGH (ref 0.00–5.00)

## 2018-02-08 ENCOUNTER — Encounter: Payer: Self-pay | Admitting: Pharmacist

## 2018-02-08 NOTE — Progress Notes (Signed)
Oral Chemotherapy Pharmacist Encounter   Received notification of follow up regarding patient's oral chemotherapy medication: Lonsurf (trifluridine and tipiracil) for the treatment of metastatic, previously treated esophagogastric adenocarcinoma, planned duration until disease progression or unacceptable toxicity.  Original Start date of oral chemotherapy: 12/31/2017 Cycle 2 start date: 01/28/2018  Pt is doing well today  Pt reports 0 tablets/doses of Lonsurf 20mg  (trifluridine component) tablets, 3 tablets (60mg  trifluridine) by mouth 2 times daily, within 1 hour of AM & PM meals, takes on days 1-5 & 8-12 of each day day cycle, missed in the last month.   Pt reports the following side effects: no issues  Pertinent labs reviewed: OK for continued treatment.  Patient knows to call the office with questions or concerns. Oral Oncology Clinic will continue to follow.  Thank you,  Johny Drilling, PharmD, BCPS, BCOP  02/08/2018 2:08 PM Oral Oncology Clinic [phone]

## 2018-02-15 ENCOUNTER — Other Ambulatory Visit: Payer: Self-pay | Admitting: Oncology

## 2018-02-15 ENCOUNTER — Other Ambulatory Visit: Payer: Self-pay | Admitting: Emergency Medicine

## 2018-02-15 MED ORDER — TRIFLURIDINE-TIPIRACIL 20-8.19 MG PO TABS: ORAL_TABLET | ORAL | 0 refills | Status: DC

## 2018-02-18 ENCOUNTER — Inpatient Hospital Stay: Payer: Medicare Other

## 2018-02-18 ENCOUNTER — Telehealth: Payer: Self-pay | Admitting: Oncology

## 2018-02-18 ENCOUNTER — Other Ambulatory Visit: Payer: Medicare Other

## 2018-02-18 ENCOUNTER — Inpatient Hospital Stay (HOSPITAL_BASED_OUTPATIENT_CLINIC_OR_DEPARTMENT_OTHER): Payer: Medicare Other | Admitting: Nurse Practitioner

## 2018-02-18 ENCOUNTER — Encounter: Payer: Self-pay | Admitting: Nurse Practitioner

## 2018-02-18 VITALS — BP 130/71 | HR 83 | Temp 97.8°F | Resp 18 | Ht 69.0 in | Wt 177.1 lb

## 2018-02-18 DIAGNOSIS — Z452 Encounter for adjustment and management of vascular access device: Secondary | ICD-10-CM | POA: Diagnosis not present

## 2018-02-18 DIAGNOSIS — R079 Chest pain, unspecified: Secondary | ICD-10-CM | POA: Diagnosis not present

## 2018-02-18 DIAGNOSIS — C155 Malignant neoplasm of lower third of esophagus: Secondary | ICD-10-CM

## 2018-02-18 DIAGNOSIS — E119 Type 2 diabetes mellitus without complications: Secondary | ICD-10-CM | POA: Diagnosis not present

## 2018-02-18 DIAGNOSIS — R109 Unspecified abdominal pain: Secondary | ICD-10-CM | POA: Diagnosis not present

## 2018-02-18 DIAGNOSIS — R918 Other nonspecific abnormal finding of lung field: Secondary | ICD-10-CM

## 2018-02-18 DIAGNOSIS — Z95828 Presence of other vascular implants and grafts: Secondary | ICD-10-CM

## 2018-02-18 DIAGNOSIS — Z9221 Personal history of antineoplastic chemotherapy: Secondary | ICD-10-CM | POA: Diagnosis not present

## 2018-02-18 DIAGNOSIS — F329 Major depressive disorder, single episode, unspecified: Secondary | ICD-10-CM

## 2018-02-18 DIAGNOSIS — Z86010 Personal history of colonic polyps: Secondary | ICD-10-CM | POA: Diagnosis not present

## 2018-02-18 DIAGNOSIS — Z79899 Other long term (current) drug therapy: Secondary | ICD-10-CM

## 2018-02-18 DIAGNOSIS — Z86718 Personal history of other venous thrombosis and embolism: Secondary | ICD-10-CM

## 2018-02-18 DIAGNOSIS — C787 Secondary malignant neoplasm of liver and intrahepatic bile duct: Secondary | ICD-10-CM

## 2018-02-18 DIAGNOSIS — J449 Chronic obstructive pulmonary disease, unspecified: Secondary | ICD-10-CM

## 2018-02-18 DIAGNOSIS — I10 Essential (primary) hypertension: Secondary | ICD-10-CM

## 2018-02-18 LAB — CBC WITH DIFFERENTIAL (CANCER CENTER ONLY)
Basophils Absolute: 0 10*3/uL (ref 0.0–0.1)
Basophils Relative: 1 %
EOS ABS: 0.2 10*3/uL (ref 0.0–0.5)
Eosinophils Relative: 4 %
HCT: 32 % — ABNORMAL LOW (ref 38.4–49.9)
Hemoglobin: 10.5 g/dL — ABNORMAL LOW (ref 13.0–17.1)
LYMPHS ABS: 1.3 10*3/uL (ref 0.9–3.3)
Lymphocytes Relative: 21 %
MCH: 27.6 pg (ref 27.2–33.4)
MCHC: 32.8 g/dL (ref 32.0–36.0)
MCV: 84.1 fL (ref 79.3–98.0)
MONOS PCT: 15 %
Monocytes Absolute: 0.9 10*3/uL (ref 0.1–0.9)
Neutro Abs: 3.5 10*3/uL (ref 1.5–6.5)
Neutrophils Relative %: 59 %
PLATELETS: 178 10*3/uL (ref 140–400)
RBC: 3.81 MIL/uL — ABNORMAL LOW (ref 4.20–5.82)
RDW: 23.5 % — ABNORMAL HIGH (ref 11.0–14.6)
WBC: 5.9 10*3/uL (ref 4.0–10.3)

## 2018-02-18 LAB — CMP (CANCER CENTER ONLY)
ALT: 19 U/L (ref 0–44)
ANION GAP: 8 (ref 5–15)
AST: 23 U/L (ref 15–41)
Albumin: 3.5 g/dL (ref 3.5–5.0)
Alkaline Phosphatase: 233 U/L — ABNORMAL HIGH (ref 38–126)
BUN: 7 mg/dL — ABNORMAL LOW (ref 8–23)
CHLORIDE: 106 mmol/L (ref 98–111)
CO2: 25 mmol/L (ref 22–32)
Calcium: 9.2 mg/dL (ref 8.9–10.3)
Creatinine: 0.7 mg/dL (ref 0.61–1.24)
GFR, Estimated: >60 mL/min (ref >60)
Glucose, Bld: 80 mg/dL (ref 70–99)
Potassium: 3.5 mmol/L (ref 3.5–5.1)
SODIUM: 139 mmol/L (ref 135–145)
Total Bilirubin: 0.5 mg/dL (ref 0.3–1.2)
Total Protein: 7.2 g/dL (ref 6.5–8.1)

## 2018-02-18 LAB — CEA (IN HOUSE-CHCC): CEA (CHCC-In House): 305.83 ng/mL — ABNORMAL HIGH (ref 0.00–5.00)

## 2018-02-18 MED ORDER — HEPARIN SOD (PORK) LOCK FLUSH 100 UNIT/ML IV SOLN
500.0000 [IU] | Freq: Once | INTRAVENOUS | Status: AC | PRN
  Administered 2018-02-18: 500 [IU] via INTRAVENOUS
  Filled 2018-02-18: qty 5

## 2018-02-18 MED ORDER — SODIUM CHLORIDE 0.9% FLUSH
10.0000 mL | INTRAVENOUS | Status: DC | PRN
  Administered 2018-02-18: 10 mL via INTRAVENOUS
  Filled 2018-02-18: qty 10

## 2018-02-18 NOTE — Progress Notes (Signed)
[place] OFFICE PROGRESS NOTE   Diagnosis: Esophagus cancer  INTERVAL HISTORY:   Justin [name] returns as scheduled.  He completed cycle 2 Lonsurf beginning 01/28/2018.  He feels well.  No nausea or vomiting.  No mouth sores.  No diarrhea.  No rash.  No fever.  He has a good appetite.  He reports his weight is stable.  He has occasional right-sided chest/abdominal pain.  Objective:  Vital signs in last 24 hours:  Blood pressure 130/71, pulse 83, temperature 97.8 F (36.6 C), temperature source Oral, resp. rate 18, height _0  (1.753 m), weight 177 lb 1.6 oz (80.3 kg), SpO2 98 %.    HEENT: No thrush or ulcers. Lymphatics: No palpable cervical or supraclavicular lymph nodes. Resp: Lungs clear bilaterally. Cardio: Regular rate and rhythm. GI: Abdomen soft and nontender.  No hepatomegaly. Vascular: No leg edema.  Skin: No rash. Port-A-Cath without erythema.   Lab Results:  Lab Results  Component Value Date   WBC 5.9 02/18/2018   HGB 10.5 (L) 02/18/2018   HCT 32.0 (L) 02/18/2018   MCV 84.1 02/18/2018   PLT 178 02/18/2018   NEUTROABS 3.5 02/18/2018    Imaging:  No results found.  Medications: I have reviewed the patient's current medications.  Assessment/Plan: 1. 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 Staging CTs 08/18/2016 with nonspecific pulmonary nodules, liver metastases, borderline thoracic and gastrohepatic ligament lymphadenopathy ? Cycle 1 FOLFOX 09/04/2016 ? Cycle 2 FOLFOX 09/18/2016 ? Cycle 3 FOLFOX 10/02/2016 ? Cycle 4 FOLFOX 10/16/2016 ? Cycle 5 FOLFOX 10/30/2016 ? Restaging CTs 11/10/2016-decreased wall thickness distal esophagus; stable borderline mediastinal lymphadenopathy; multiple liver metastases have completely resolved; stable scattered nonspecific tiny bilateral lung nodules ? Cycle 6 FOLFOX 11/13/2016 ? Cycle 7 FOLFOX  11/27/2016 ? Cycle 8 FOLFOX 12/11/2016 ? Cycle 9 FOLFOX 12/25/2016 ? Cycle 10 FOLFOX (oxaliplatin deleted) 01/08/2017 ? Initiation of maintenance therapy with 5-FU/leucovorin 02/06/2017 ? CTs 03/16/2017-stable pulmonary nodules, no esophagus mass, 1 subtle liver lesion ? Maintenance therapy continued with 5-FU/leucovorin every 3 weeks beginning 03/20/2017 ? CTs 06/29/2017-progressive liver metastases ? Cycle 1 Taxol/ramucirumab 07/12/2017, allergic reaction to Taxol ? Emergency room with right upper quadrant pain 07/14/2017-CTwithenlargement of liver lesions ? Cycle 1 day 8 07/18/2017-Abraxane substituted for Taxol ? Cycle 2-day 1 Abraxane/ramucirumab 08/09/2017 ? CEA improved 08/09/2017 ? CEA improved2/07/2017 ? Cycle 3-day 1 Abraxane/ramucirumab 09/06/2017;cycle 3-day 15 held due to neutropenia, poor performance status ? CTs 09/27/2017-slight decrease in right supraclavicular lymph nodes, mild increase in size of central right liver lesion, other liver lesions stable, stable small pulmonary nodules, marked narrowing of the SVC with thrombus around the Port-A-Cath ? Abraxane/ramucirumabcontinued on a 2-week schedule ? Restaging CTs 12/10/2017-stable appearance of the neck with multiple prominent neck lymph nodes without disease progression; similar to slight increase in thoracic adenopathy; progression of liver metastases; development of small volume abdominopelvic ascites; enlarging porta hepatis nodes. ? Cycle 1 Lonsurf 12/31/2017 ? Cycle 2 Lonsurf 01/28/2018 ? Cycle 3 Lonsurf 02/25/2018 2. Diabetes  3. COPD  4. Hypertension  5. History of colon polyps  6. Port-A-Cath placement 08/30/2016  7. 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  8. Right upper extremity deep vein thrombosis, Port-A-Cath related 08/24/2018started Xarelto 03/16/2017, possible subsegmental right lower lobe pulmonary embolus on CT 03/16/2017  CT  09/27/2017-narrowing of the SVC with thrombus around the Port-A-Cath, extensive chest wall collaterals  9.Gross hematuria 05/24/2017-status post bladder irrigation in the emergency  room, referred to urology  10.Depression. Trial of Remeron initiated 09/06/2017.    Disposition: Justin [name] appears stable.  He has completed 2 cycles of Lonsurf.  He seems to be tolerating the Lonsurf well.  There is no clinical evidence of disease progression.  Plan to proceed with cycle 3 Lonsurf as scheduled on 02/25/2018.  He will undergo restaging CTs after completing cycle 3.  He will return for a follow-up visit on 03/20/2018 to review the CT results.  He will contact the office in the interim with any problems.  Plan reviewed with Dr. Benay Spice.    Ned Card ANP/GNP-BC   02/18/2018  2:47 PM

## 2018-02-18 NOTE — Telephone Encounter (Signed)
Appointments scheduled AVS/Calendar/contrast with instructions provided per 7/29 los

## 2018-03-18 ENCOUNTER — Ambulatory Visit: Payer: Self-pay | Admitting: Family Medicine

## 2018-03-18 ENCOUNTER — Ambulatory Visit (HOSPITAL_COMMUNITY)
Admission: RE | Admit: 2018-03-18 | Discharge: 2018-03-18 | Disposition: A | Discharge status: Home / Self Care | Payer: Medicare Other | Source: Ambulatory Visit | Attending: Nurse Practitioner | Admitting: Nurse Practitioner

## 2018-03-18 DIAGNOSIS — I700 Atherosclerosis of aorta: Secondary | ICD-10-CM | POA: Diagnosis not present

## 2018-03-18 DIAGNOSIS — R188 Other ascites: Secondary | ICD-10-CM | POA: Insufficient documentation

## 2018-03-18 DIAGNOSIS — C159 Malignant neoplasm of esophagus, unspecified: Secondary | ICD-10-CM | POA: Diagnosis not present

## 2018-03-18 DIAGNOSIS — C155 Malignant neoplasm of lower third of esophagus: Secondary | ICD-10-CM | POA: Diagnosis not present

## 2018-03-18 DIAGNOSIS — C787 Secondary malignant neoplasm of liver and intrahepatic bile duct: Secondary | ICD-10-CM | POA: Insufficient documentation

## 2018-03-18 DIAGNOSIS — R590 Localized enlarged lymph nodes: Secondary | ICD-10-CM | POA: Insufficient documentation

## 2018-03-18 MED ORDER — IOHEXOL 300 MG/ML  SOLN
100.0000 mL | Freq: Once | INTRAMUSCULAR | Status: AC | PRN
  Administered 2018-03-18: 100 mL via INTRAVENOUS

## 2018-03-19 ENCOUNTER — Other Ambulatory Visit: Payer: Self-pay | Admitting: Oncology

## 2018-03-20 ENCOUNTER — Telehealth: Payer: Self-pay

## 2018-03-20 ENCOUNTER — Inpatient Hospital Stay: Payer: Medicare Other | Attending: Oncology

## 2018-03-20 ENCOUNTER — Inpatient Hospital Stay: Payer: Medicare Other

## 2018-03-20 ENCOUNTER — Inpatient Hospital Stay (HOSPITAL_BASED_OUTPATIENT_CLINIC_OR_DEPARTMENT_OTHER): Payer: Medicare Other | Admitting: Oncology

## 2018-03-20 DIAGNOSIS — F329 Major depressive disorder, single episode, unspecified: Secondary | ICD-10-CM

## 2018-03-20 DIAGNOSIS — Z79899 Other long term (current) drug therapy: Secondary | ICD-10-CM

## 2018-03-20 DIAGNOSIS — Z86718 Personal history of other venous thrombosis and embolism: Secondary | ICD-10-CM

## 2018-03-20 DIAGNOSIS — Z9221 Personal history of antineoplastic chemotherapy: Secondary | ICD-10-CM | POA: Insufficient documentation

## 2018-03-20 DIAGNOSIS — Z452 Encounter for adjustment and management of vascular access device: Secondary | ICD-10-CM | POA: Diagnosis not present

## 2018-03-20 DIAGNOSIS — R1011 Right upper quadrant pain: Secondary | ICD-10-CM | POA: Insufficient documentation

## 2018-03-20 DIAGNOSIS — Z86010 Personal history of colonic polyps: Secondary | ICD-10-CM | POA: Diagnosis not present

## 2018-03-20 DIAGNOSIS — Z95828 Presence of other vascular implants and grafts: Secondary | ICD-10-CM

## 2018-03-20 DIAGNOSIS — C787 Secondary malignant neoplasm of liver and intrahepatic bile duct: Secondary | ICD-10-CM | POA: Diagnosis not present

## 2018-03-20 DIAGNOSIS — C155 Malignant neoplasm of lower third of esophagus: Secondary | ICD-10-CM | POA: Insufficient documentation

## 2018-03-20 DIAGNOSIS — R4702 Dysphasia: Secondary | ICD-10-CM | POA: Insufficient documentation

## 2018-03-20 DIAGNOSIS — Z7901 Long term (current) use of anticoagulants: Secondary | ICD-10-CM | POA: Insufficient documentation

## 2018-03-20 DIAGNOSIS — J449 Chronic obstructive pulmonary disease, unspecified: Secondary | ICD-10-CM

## 2018-03-20 DIAGNOSIS — I10 Essential (primary) hypertension: Secondary | ICD-10-CM

## 2018-03-20 LAB — CBC WITH DIFFERENTIAL (CANCER CENTER ONLY)
Basophils Absolute: 0 10*3/uL (ref 0.0–0.1)
Basophils Relative: 1 %
Eosinophils Absolute: 0.1 10*3/uL (ref 0.0–0.5)
Eosinophils Relative: 3 %
HEMATOCRIT: 27.9 % — AB (ref 38.4–49.9)
Hemoglobin: 9.1 g/dL — ABNORMAL LOW (ref 13.0–17.1)
LYMPHS ABS: 1 10*3/uL (ref 0.9–3.3)
LYMPHS PCT: 24 %
MCH: 28.9 pg (ref 27.2–33.4)
MCHC: 32.6 g/dL (ref 32.0–36.0)
MCV: 88.6 fL (ref 79.3–98.0)
Monocytes Absolute: 0.7 10*3/uL (ref 0.1–0.9)
Monocytes Relative: 17 %
Neutro Abs: 2.1 10*3/uL (ref 1.5–6.5)
Neutrophils Relative %: 55 %
PLATELETS: 122 10*3/uL — AB (ref 140–400)
RBC: 3.15 MIL/uL — ABNORMAL LOW (ref 4.20–5.82)
RDW: 24 % — ABNORMAL HIGH (ref 11.0–14.6)
WBC Count: 3.9 10*3/uL — ABNORMAL LOW (ref 4.0–10.3)

## 2018-03-20 LAB — CMP (CANCER CENTER ONLY)
ALK PHOS: 235 U/L — AB (ref 38–126)
ALT: 25 U/L (ref 0–44)
AST: 31 U/L (ref 15–41)
Albumin: 3 g/dL — ABNORMAL LOW (ref 3.5–5.0)
Anion gap: 7 (ref 5–15)
BILIRUBIN TOTAL: 0.6 mg/dL (ref 0.3–1.2)
BUN: 9 mg/dL (ref 8–23)
CALCIUM: 8.8 mg/dL — AB (ref 8.9–10.3)
CHLORIDE: 107 mmol/L (ref 98–111)
CO2: 24 mmol/L (ref 22–32)
CREATININE: 0.72 mg/dL (ref 0.61–1.24)
Glucose, Bld: 120 mg/dL — ABNORMAL HIGH (ref 70–99)
Potassium: 3.4 mmol/L — ABNORMAL LOW (ref 3.5–5.1)
Sodium: 138 mmol/L (ref 135–145)
TOTAL PROTEIN: 6.6 g/dL (ref 6.5–8.1)

## 2018-03-20 LAB — CEA (IN HOUSE-CHCC): CEA (CHCC-In House): 540.54 ng/mL — ABNORMAL HIGH (ref 0.00–5.00)

## 2018-03-20 MED ORDER — SODIUM CHLORIDE 0.9% FLUSH
10.0000 mL | INTRAVENOUS | Status: DC | PRN
  Administered 2018-03-20: 10 mL via INTRAVENOUS
  Filled 2018-03-20: qty 10

## 2018-03-20 MED ORDER — RIVAROXABAN 20 MG PO TABS: 20.0000 mg | ORAL_TABLET | Freq: Every day | ORAL | 5 refills | Status: AC

## 2018-03-20 MED ORDER — HYDROCODONE-ACETAMINOPHEN 5-325 MG PO TABS: 1.0000 | ORAL_TABLET | ORAL | 0 refills | Status: AC | PRN

## 2018-03-20 MED ORDER — HEPARIN SOD (PORK) LOCK FLUSH 100 UNIT/ML IV SOLN
500.0000 [IU] | Freq: Once | INTRAVENOUS | Status: AC | PRN
  Administered 2018-03-20: 500 [IU] via INTRAVENOUS
  Filled 2018-03-20: qty 5

## 2018-03-20 NOTE — Progress Notes (Signed)
[facility] OFFICE PROGRESS NOTE   Diagnosis: Esophagus cancer  INTERVAL HISTORY:   Justin [name] returns for a scheduled visit.  He completed another cycle of Lonsurf getting 02/25/2018.  He tolerated the Lonsurf well.  He reports that he feels better in general since starting the [place].  He has noticed increased solid dysphasia.  This was worse when he ate chicken and steak.  No liquid dysphagia.  He continues to have pain in the right upper abdomen.  No neck pain or tenderness.  Objective:  Vital signs in last 24 hours:  Blood pressure 123/80, pulse 84, temperature 97.9 F (36.6 C), temperature source Oral, resp. rate 18, height 5' 9"  (1.753 m), weight 176 lb 12.8 oz (80.2 kg), SpO2 100 %.    HEENT: No thrush or ulcers, neck without mass, erythema, or tenderness Resp: Lungs clear bilaterally Cardio: Regular rate and rhythm GI: The liver edge is palpable in the right medial upper abdomen Vascular: Trace edema at the left lower leg    Portacath/PICC-without erythema  Lab Results:  Lab Results  Component Value Date   WBC 3.9 (L) 03/20/2018   HGB 9.1 (L) 03/20/2018   HCT 27.9 (L) 03/20/2018   MCV 88.6 03/20/2018   PLT 122 (L) 03/20/2018   NEUTROABS 2.1 03/20/2018    CMP  Lab Results  Component Value Date   NA 139 02/18/2018   K 3.5 02/18/2018   CL 106 02/18/2018   CO2 25 02/18/2018   GLUCOSE 80 02/18/2018   BUN 7 (L) 02/18/2018   CREATININE 0.70 02/18/2018   CALCIUM 9.2 02/18/2018   PROT 7.2 02/18/2018   ALBUMIN 3.5 02/18/2018   AST 23 02/18/2018   ALT 19 02/18/2018   ALKPHOS 233 (H) 02/18/2018   BILITOT 0.5 02/18/2018   GFRNONAA >60 02/18/2018   GFRAA >60 02/18/2018    Lab Results  Component Value Date   CEA1 305.83 (H) 02/18/2018     Imaging:  Ct Soft Tissue Neck W Contrast  Result Date: 03/18/2018 CLINICAL DATA:  Re-stage esophageal cancer EXAM: CT NECK WITH CONTRAST TECHNIQUE: Multidetector CT imaging of the neck was performed using  the standard protocol following the bolus administration of intravenous contrast. CONTRAST:  173m OMNIPAQUE IOHEXOL 300 MG/ML  SOLN COMPARISON:  09/27/2017 FINDINGS: Pharynx and larynx: No evidence of mass or swelling. Small retention cyst along the right tonsillar fossa. Salivary glands: Presumed facet prominent presumed lymph nodes in the left parotid tail measuring 6 mm short axis, non progressed. Thyroid: Negative Lymph nodes: Right supraclavicular lymph node highlighted previously is unchanged at 14 x 8 mm on axial slices. No cavitary nodes.  No enlarging nodes. Vascular: There is new focal inflammation around the thick walled distal right common carotid with a high-density mural crescent anteriorly. No luminal stenosis is seen. There is atherosclerotic plaque diffusely along the carotids. Limited intracranial: Negative Visualized orbits: Negative Mastoids and visualized paranasal sinuses: Opacified posterior ethmoid air cell on the right. No fluid levels Skeleton: Diffuse degenerative disc narrowing and ridging with multilevel foraminal narrowing. No acute or aggressive finding. Upper chest: Reported separately Other: A call has been placed to the ordering provider IMPRESSION: 1. Stable prominent right supraclavicular node. No newly enlarged or enlarging lymph nodes in the neck. 2. Focal inflammation around the distal right common carotid artery, please correlate for symptoms of carotidynia. The anterior wall shows high-density thickening and intramural hematoma is also considered. 3. Chest reported separately Electronically Signed   By: JNeva SeatD.  On: 03/18/2018 11:04   Ct Chest W Contrast  Result Date: 03/18/2018 CLINICAL DATA:  Restaging esophageal cancer. EXAM: CT CHEST, ABDOMEN, AND PELVIS WITH CONTRAST TECHNIQUE: Multidetector CT imaging of the chest, abdomen and pelvis was performed following the standard protocol during bolus administration of intravenous contrast. CONTRAST:  139m  OMNIPAQUE IOHEXOL 300 MG/ML  SOLN COMPARISON:  CT scan 12/10/2017 FINDINGS: CT CHEST FINDINGS Cardiovascular: The heart is normal in size. No pericardial effusion. Stable age advanced atherosclerotic calcifications involving the aorta and branch vessels including three-vessel coronary artery calcifications. No aneurysm or dissection. Mediastinum/Nodes: Interval improvement in the mediastinal and hilar lymphadenopathy. Precarinal lymph node on image number 23 measures 11.5 mm and previously measured 13.5 mm. Right hilar lymph node on image number 28 measures 7 mm and previously measured 11 mm. 9.5 mm subcarinal lymph node on image number 31 previously measured 13.5 mm. Left-sided subcarinal lymph node on image number 29 measures 8.5 mm and previously measured 11 mm. Mild distal esophageal wall thickening without discrete mass. Lungs/Pleura: Stable emphysematous changes and pulmonary scarring. Stable changes of peripheral interstitial lung disease with vague nodularity. Small scattered pulmonary nodules are again noted. Most of these are stable. There is a 7 mm nodule in the right lower lobe on image number 108 adjacent to the heart which previously measured 5 mm. 4.5 mm nodule adjacent to the left major fissure in the left lower lobe image number 67 previously measured 2.5 mm. Attention on future scans is suggested. Musculoskeletal: No chest wall mass, supraclavicular or axillary lymphadenopathy. The right-sided Port-A-Cath appears stable. No significant bony findings. CT ABDOMEN PELVIS FINDINGS Hepatobiliary: Progression of hepatic metastatic disease. 5.3 x 4.2 cm lesion at the right hepatic dome on image number 47 previously measured 3.2 x 2.6 cm. Large left hepatic lobe lesion on image number 56 measures 7.9 x 5.6 cm and previously measured 3.8 x 5.5 cm. 4.0 x 3.5 cm segment 7 lesion previously measured 2.9 x 2.7 cm. Two adjacent lesions in segment 6 on image number 74 measure 6.0 x 4.4 cm. These were previously 2  separate 2.5 cm lesions. New 2.3 cm lesion in segment 4B on image number 70. Pancreas: No mass, inflammation or ductal dilatation. Spleen: Normal size.  No focal lesions. Adrenals/Urinary Tract: Adrenal glands and kidneys are unremarkable and stable. The bladder is grossly normal. Stomach/Bowel: The stomach, duodenum, small bowel and colon are grossly normal. No acute inflammatory changes, mass lesions or obstructive findings. Vascular/Lymphatic: Advanced atherosclerotic calcifications involving the aorta and branch vessels. The major venous structures are patent. Celiac axis and periportal lymph nodes are stable. No new or progressive findings. Small scattered retroperitoneal lymph nodes are also stable. Reproductive: Stable enlarged prostate gland with marked median lobe hypertrophy impressing on the base the bladder. Seminal vesicles appear normal. Other: No inguinal mass or hernia. Musculoskeletal: No significant bony findings. IMPRESSION: 1. Interval slight regression of mediastinal and hilar lymphadenopathy. 2. Most of the pulmonary nodules are stable. Two nodules have enlarged slightly. Attention on future scans is suggested. 3. Significant progression of hepatic metastatic disease. 4. Stable periportal, celiac axis and retroperitoneal lymph nodes. 5. Stable advanced atherosclerotic calcifications involving the thoracic and abdominal aorta and branch vessels. 6. Progressive moderate abdominal/pelvic ascites. Electronically Signed   By: PMarijo SanesM.D.   On: 03/18/2018 13:34   Ct Abdomen Pelvis W Contrast  Result Date: 03/18/2018 CLINICAL DATA:  Restaging esophageal cancer. EXAM: CT CHEST, ABDOMEN, AND PELVIS WITH CONTRAST TECHNIQUE: Multidetector CT imaging of the chest, abdomen  and pelvis was performed following the standard protocol during bolus administration of intravenous contrast. CONTRAST:  166m OMNIPAQUE IOHEXOL 300 MG/ML  SOLN COMPARISON:  CT scan 12/10/2017 FINDINGS: CT CHEST FINDINGS  Cardiovascular: The heart is normal in size. No pericardial effusion. Stable age advanced atherosclerotic calcifications involving the aorta and branch vessels including three-vessel coronary artery calcifications. No aneurysm or dissection. Mediastinum/Nodes: Interval improvement in the mediastinal and hilar lymphadenopathy. Precarinal lymph node on image number 23 measures 11.5 mm and previously measured 13.5 mm. Right hilar lymph node on image number 28 measures 7 mm and previously measured 11 mm. 9.5 mm subcarinal lymph node on image number 31 previously measured 13.5 mm. Left-sided subcarinal lymph node on image number 29 measures 8.5 mm and previously measured 11 mm. Mild distal esophageal wall thickening without discrete mass. Lungs/Pleura: Stable emphysematous changes and pulmonary scarring. Stable changes of peripheral interstitial lung disease with vague nodularity. Small scattered pulmonary nodules are again noted. Most of these are stable. There is a 7 mm nodule in the right lower lobe on image number 108 adjacent to the heart which previously measured 5 mm. 4.5 mm nodule adjacent to the left major fissure in the left lower lobe image number 67 previously measured 2.5 mm. Attention on future scans is suggested. Musculoskeletal: No chest wall mass, supraclavicular or axillary lymphadenopathy. The right-sided Port-A-Cath appears stable. No significant bony findings. CT ABDOMEN PELVIS FINDINGS Hepatobiliary: Progression of hepatic metastatic disease. 5.3 x 4.2 cm lesion at the right hepatic dome on image number 47 previously measured 3.2 x 2.6 cm. Large left hepatic lobe lesion on image number 56 measures 7.9 x 5.6 cm and previously measured 3.8 x 5.5 cm. 4.0 x 3.5 cm segment 7 lesion previously measured 2.9 x 2.7 cm. Two adjacent lesions in segment 6 on image number 74 measure 6.0 x 4.4 cm. These were previously 2 separate 2.5 cm lesions. New 2.3 cm lesion in segment 4B on image number 70. Pancreas: No  mass, inflammation or ductal dilatation. Spleen: Normal size.  No focal lesions. Adrenals/Urinary Tract: Adrenal glands and kidneys are unremarkable and stable. The bladder is grossly normal. Stomach/Bowel: The stomach, duodenum, small bowel and colon are grossly normal. No acute inflammatory changes, mass lesions or obstructive findings. Vascular/Lymphatic: Advanced atherosclerotic calcifications involving the aorta and branch vessels. The major venous structures are patent. Celiac axis and periportal lymph nodes are stable. No new or progressive findings. Small scattered retroperitoneal lymph nodes are also stable. Reproductive: Stable enlarged prostate gland with marked median lobe hypertrophy impressing on the base the bladder. Seminal vesicles appear normal. Other: No inguinal mass or hernia. Musculoskeletal: No significant bony findings. IMPRESSION: 1. Interval slight regression of mediastinal and hilar lymphadenopathy. 2. Most of the pulmonary nodules are stable. Two nodules have enlarged slightly. Attention on future scans is suggested. 3. Significant progression of hepatic metastatic disease. 4. Stable periportal, celiac axis and retroperitoneal lymph nodes. 5. Stable advanced atherosclerotic calcifications involving the thoracic and abdominal aorta and branch vessels. 6. Progressive moderate abdominal/pelvic ascites. Electronically Signed   By: PMarijo SanesM.D.   On: 03/18/2018 13:34   CT images reviewed with Mr. MMazurand his family Medications: I have reviewed the patient's current medications.   Assessment/Plan: 1. 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 Staging CTs 08/18/2016 with nonspecific pulmonary nodules, liver metastases, borderline thoracic and gastrohepatic ligament lymphadenopathy ? Cycle 1 FOLFOX 09/04/2016 ? Cycle 2 FOLFOX 09/18/2016 ? Cycle 3  FOLFOX 10/02/2016 ? Cycle  4 FOLFOX 10/16/2016 ? Cycle 5 FOLFOX 10/30/2016 ? Restaging CTs 11/10/2016-decreased wall thickness distal esophagus; stable borderline mediastinal lymphadenopathy; multiple liver metastases have completely resolved; stable scattered nonspecific tiny bilateral lung nodules ? Cycle 6 FOLFOX 11/13/2016 ? Cycle 7 FOLFOX 11/27/2016 ? Cycle 8 FOLFOX 12/11/2016 ? Cycle 9 FOLFOX 12/25/2016 ? Cycle 10 FOLFOX (oxaliplatin deleted) 01/08/2017 ? Initiation of maintenance therapy with 5-FU/leucovorin 02/06/2017 ? CTs 03/16/2017-stable pulmonary nodules, no esophagus mass, 1 subtle liver lesion ? Maintenance therapy continued with 5-FU/leucovorin every 3 weeks beginning 03/20/2017 ? CTs 06/29/2017-progressive liver metastases ? Cycle 1 Taxol/ramucirumab 07/12/2017, allergic reaction to Taxol ? Emergency room with right upper quadrant pain 07/14/2017 ? Cycle 1 day 8 07/18/2017-Abraxane substituted for Taxol ? Cycle 2-day 1 Abraxane/ramucirumab 08/09/2017 ? CEA improved 08/09/2017 ? CEA improved2/07/2017 ? Cycle 3-day 1 Abraxane/ramucirumab 09/06/2017;cycle 3-day 15 held due to neutropenia, poor performance status ? CTs 09/27/2017-slight decrease in right supraclavicular lymph nodes, mild increase in size of central right liver lesion, other liver lesions stable, stable small pulmonary nodules, marked narrowing of the SVC with thrombus around the Port-A-Cath ? Abraxane/ramucirumabcontinued on a 2-week schedule ? Restaging CTs 12/10/2017-stable appearance of the neck with multiple prominent neck lymph nodes without disease progression; similar to slight increase in thoracic adenopathy; progression of liver metastases; development of small volume abdominopelvic ascites; enlarging porta hepatis nodes.  Progression of hepatic metastases, progressive ascites, ? Cycle 1Lonsurf6/04/2018 ? Cycle 2Lonsurf7/02/2018 ? Cycle 3 Lonsurf 02/25/2018 ? CTs 03/18/2018-slight decrease in mediastinal/hilar lymphadenopathy,  enlargement of 2 lung nodules, focal inflammation at the distal right common carotid artery 2. Diabetes  3. COPD  4. Hypertension  5. History of colon polyps  6. Port-A-Cath placement 08/30/2016  7. 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  8. Right upper extremity deep vein thrombosis, Port-A-Cath related 08/24/2018started Xarelto 03/16/2017, possible subsegmental right lower lobe pulmonary embolus on CT 03/16/2017  CT 09/27/2017-narrowing of the SVC with thrombus around the Port-A-Cath, extensive chest wall collaterals  9.Gross hematuria 05/24/2017-status post bladder irrigation in the emergency room, referred to urology  10.Depression. Trial of Remeron initiated 09/06/2017.    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  The restaging CTs are consistent with progressive disease in the liver.  He has been treated with 3 lines of systemic therapy.  I reviewed the CT images and discussed treatment options with Mr. Currier and his family.  The chance of clinical improvement with further systemic therapy is small.  He agrees to Hospice care.  We discussed CPR and ACLS issues.  He will be placed on a no CODE BLUE status.  We discussed the solid dysphasia.  He would like to continue a liquid/mechanical soft diet as opposed to considering palliative radiation.  Mr. Lynnette Caffey will continue hydrocodone for pain.  He will be referred to the [facility].  He will return for an office visit in approximately 3 weeks.  25 minutes were spent with the patient today.  The majority of the time was used for counseling and coordination of care.  Betsy Coder, MD  03/20/2018  12:16 PM

## 2018-03-20 NOTE — Telephone Encounter (Signed)
Printed  avs and calender of upcoming appointment. Per 8/28 los 

## 2018-03-20 NOTE — Progress Notes (Signed)
Pt. Port had no blood return flushed and locked with heparin. Peripheral labs drawn, No further problems or concerns noted

## 2018-03-21 ENCOUNTER — Telehealth: Payer: Self-pay | Admitting: Family Medicine

## 2018-03-21 ENCOUNTER — Telehealth: Payer: Self-pay | Admitting: *Deleted

## 2018-03-21 NOTE — Telephone Encounter (Addendum)
Cassandra @ Hospice called to see if Dr. Redmond School will agree to sign off on pt's records as Attending Physician. Pt was referred to Hospice by [facility] with a prognosis of 6 months. Call [place] back at [place]

## 2018-03-21 NOTE — Telephone Encounter (Signed)
ok 

## 2018-03-21 NOTE — Telephone Encounter (Signed)
Referral faxed for [facility]

## 2018-03-22 ENCOUNTER — Telehealth: Payer: Self-pay | Admitting: *Deleted

## 2018-03-22 NOTE — Telephone Encounter (Signed)
"  [name] calling for [employer].  Manufacturer called today at 3:00 pm reporting patient is still on the medication.  Called the patient who says he "is absolutely not on Lonsurf".    Verbal order received and read back from Dr. Benay Spice for Justin [name] has been discontinued.  Order given to Clarise Cruz at this time.  Clarise Cruz states [employer] will notify manufacturer patient has stopped Lonsurf.   Lonsurf discontinued from current at this time.

## 2018-03-22 NOTE — Telephone Encounter (Signed)
She says thanks but pt has chosen to go with his oncologist . Justin [name]

## 2018-03-27 ENCOUNTER — Encounter: Payer: Self-pay | Admitting: Family Medicine

## 2018-03-27 ENCOUNTER — Ambulatory Visit (INDEPENDENT_AMBULATORY_CARE_PROVIDER_SITE_OTHER): Payer: Medicare Other | Admitting: Family Medicine

## 2018-03-27 VITALS — BP 120/76 | HR 96 | Temp 97.7°F | Wt 178.2 lb

## 2018-03-27 DIAGNOSIS — E1121 Type 2 diabetes mellitus with diabetic nephropathy: Secondary | ICD-10-CM | POA: Diagnosis not present

## 2018-03-27 DIAGNOSIS — F17200 Nicotine dependence, unspecified, uncomplicated: Secondary | ICD-10-CM | POA: Diagnosis not present

## 2018-03-27 DIAGNOSIS — C155 Malignant neoplasm of lower third of esophagus: Secondary | ICD-10-CM

## 2018-03-27 NOTE — Progress Notes (Deleted)
Subjective:    Patient ID: Justin [name], male    DOB: 03-08-1951, 67 y.o.   MRN: [id number]  Justin [name] is a 67 y.o. male who presents for follow-up of Type 2 diabetes mellitus.  Patient is checking home blood sugars.   Home blood sugar records: meter record How often is blood sugars being checked: 1-2 x a months Current symptoms/problems include {Symptoms; diabetes:14075} and have been {Desc; course:15616}. Daily foot checks: yes  Any foot concerns: all toe numb  Last eye exam: over 2 years  Exercise: none  The following portions of the patient's history were reviewed and updated as appropriate: allergies, current medications, past medical history, past social history and problem list.  ROS as in subjective above.     Objective:    Physical Exam Alert and in no distress otherwise not examined.  There were no vitals taken for this visit.  Lab Review Diabetic Labs Latest Ref Rng & Units 03/20/2018 02/18/2018 01/23/2018 12/12/2017 12/05/2017  HbA1c - - - - - -  Microalbumin mg/L - - - - -  Micro/Creat Ratio - - - - - -  Chol <200 mg/dL - - - - -  HDL >40 mg/dL - - - - -  Calc LDL <100 mg/dL - - - - -  Triglycerides <150 mg/dL - - - - -  Creatinine 0.61 - 1.24 mg/dL 0.72 0.70 0.68 0.78 0.73   BP/Weight 03/20/2018 02/18/2018 01/23/2018 12/19/2017 7/82/9562  Systolic BP 130 865 784 696 295  Diastolic BP 80 71 80 75 84  Wt. (Lbs) 176.8 177.1 178.8 184.4 186.8  BMI 26.11 26.15 26.4 27.23 27.59   Foot/eye exam completion dates 01/28/2016 01/29/2015  Foot Form Completion Done Done    Justin [name]  reports that he has been smoking cigarettes. He has a 50.00 pack-year smoking history. He has never used smokeless tobacco. He reports that he does not drink alcohol or use drugs.     Assessment & Plan:    No diagnosis found.  1. Rx changes: {none:33079} 2. Education: Reviewed 'ABCs' of diabetes management (respective goals in parentheses):  A1C (<7), blood pressure (<130/80), and cholesterol  (LDL <100). 3. Compliance at present is estimated to be {good/fair/poor:33178}. Efforts to improve compliance (if necessary) will be directed at {compliance:16716}. 4. Follow up: {NUMBERS; 2-84:13244} {time:11}         Subjective:    Patient ID: Justin [name], male    DOB: February 28, 1951, 67 y.o.   MRN: [id number]  Justin [name] is a 67 y.o. male who presents for follow-up of Type 2 diabetes mellitus.  Patient {is/are not:32546} checking home blood sugars.   Home blood sugar records: {dm home sugars:14018} How often is blood sugars being checked: *** Current symptoms/problems include {Symptoms; diabetes:14075} and have been {Desc; course:15616}. Daily foot checks: ***   Any foot concerns: *** Last eye exam: *** Exercise: {types:19826}  The following portions of the patient's history were reviewed and updated as appropriate: allergies, current medications, past medical history, past social history and problem list.  ROS as in subjective above.     Objective:    Physical Exam Alert and in no distress otherwise not examined.  There were no vitals taken for this visit.  Lab Review Diabetic Labs Latest Ref Rng & Units 03/20/2018 02/18/2018 01/23/2018 12/12/2017 12/05/2017  HbA1c - - - - - -  Microalbumin mg/L - - - - -  Micro/Creat Ratio - - - - - -  Chol <200 mg/dL - - - - -  HDL >40 mg/dL - - - - -  Calc LDL <100 mg/dL - - - - -  Triglycerides <150 mg/dL - - - - -  Creatinine 0.61 - 1.24 mg/dL 0.72 0.70 0.68 0.78 0.73   BP/Weight 03/20/2018 02/18/2018 01/23/2018 12/19/2017 3/53/2992  Systolic BP 426 834 196 222 979  Diastolic BP 80 71 80 75 84  Wt. (Lbs) 176.8 177.1 178.8 184.4 186.8  BMI 26.11 26.15 26.4 27.23 27.59   Foot/eye exam completion dates 01/28/2016 01/29/2015  Foot Form Completion Done Done    Justin [name]  reports that he has been smoking cigarettes. He has a 50.00 pack-year smoking history. He has never used smokeless tobacco. He reports that he does not drink alcohol or use  drugs.     Assessment & Plan:    No diagnosis found.  5. Rx changes: {none:33079} 6. Education: Reviewed 'ABCs' of diabetes management (respective goals in parentheses):  A1C (<7), blood pressure (<130/80), and cholesterol (LDL <100). 7. Compliance at present is estimated to be {good/fair/poor:33178}. Efforts to improve compliance (if necessary) will be directed at {compliance:16716}. 8. Follow up: {NUMBERS; 0-10:33138} {time:11}

## 2018-03-27 NOTE — Progress Notes (Signed)
   Subjective:    Patient ID: Justin [name], male    DOB: [birth year], 67 y.o.   MRN: [id number]  HPI He is here for an interval evaluation.  He is now in hospice.  He has a history of esophageal cancer and has been doing doing fairly well all things considered.  He has come to grips with his mortality.  He is having very little pain and seems to be able to eat without too much difficulty.  The hospice nurses have been to his house and plan to come twice per week but he is asked him to come just once per week for right now. He does have underlying diabetes and does continue to smoke.  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  Encouraged him to call me if he has any questions or concerns.  We will not schedule another appointment with him and will try to work through the hospice nurses as much as possible.  He was comfortable with that.

## 2018-04-08 ENCOUNTER — Telehealth: Payer: Self-pay | Admitting: Nurse Practitioner

## 2018-04-08 ENCOUNTER — Encounter: Payer: Self-pay | Admitting: Nurse Practitioner

## 2018-04-08 ENCOUNTER — Inpatient Hospital Stay: Payer: Medicare Other | Attending: Oncology | Admitting: Nurse Practitioner

## 2018-04-08 VITALS — BP 131/74 | HR 95 | Temp 98.1°F | Resp 18 | Ht 69.0 in | Wt 181.0 lb

## 2018-04-08 DIAGNOSIS — R079 Chest pain, unspecified: Secondary | ICD-10-CM | POA: Insufficient documentation

## 2018-04-08 DIAGNOSIS — Z79899 Other long term (current) drug therapy: Secondary | ICD-10-CM | POA: Diagnosis not present

## 2018-04-08 DIAGNOSIS — I10 Essential (primary) hypertension: Secondary | ICD-10-CM | POA: Diagnosis not present

## 2018-04-08 DIAGNOSIS — G629 Polyneuropathy, unspecified: Secondary | ICD-10-CM | POA: Diagnosis not present

## 2018-04-08 DIAGNOSIS — Z86010 Personal history of colonic polyps: Secondary | ICD-10-CM | POA: Diagnosis not present

## 2018-04-08 DIAGNOSIS — C787 Secondary malignant neoplasm of liver and intrahepatic bile duct: Secondary | ICD-10-CM | POA: Insufficient documentation

## 2018-04-08 DIAGNOSIS — C155 Malignant neoplasm of lower third of esophagus: Secondary | ICD-10-CM | POA: Diagnosis not present

## 2018-04-08 DIAGNOSIS — Z9221 Personal history of antineoplastic chemotherapy: Secondary | ICD-10-CM | POA: Insufficient documentation

## 2018-04-08 DIAGNOSIS — R188 Other ascites: Secondary | ICD-10-CM | POA: Insufficient documentation

## 2018-04-08 DIAGNOSIS — E119 Type 2 diabetes mellitus without complications: Secondary | ICD-10-CM | POA: Diagnosis not present

## 2018-04-08 DIAGNOSIS — J449 Chronic obstructive pulmonary disease, unspecified: Secondary | ICD-10-CM | POA: Insufficient documentation

## 2018-04-08 NOTE — Telephone Encounter (Signed)
Scheduled appt per 9/16 los - gave patient AVS and calender per los.   

## 2018-04-08 NOTE — Progress Notes (Addendum)
[place] OFFICE PROGRESS NOTE   Diagnosis: Esophagus cancer  INTERVAL HISTORY:   Mr. Warchol returns as scheduled.  He denies significant dysphagia as long as he takes small bites and chews well.  He continues to have occasional pain at the right chest.  Bowels are moving.  He notes stools are "hard" at times.  He notes progressive abdominal distention.  No leg swelling.  Objective:  Vital signs in last 24 hours:  Blood pressure 131/74, pulse 95, temperature 98.1 F (36.7 C), resp. rate 18, height 5' 9"  (1.753 m), weight 181 lb (82.1 kg), SpO2 95 %.    HEENT: No thrush or ulcers.  Resp: Lungs with inspiratory rhonchi bilaterally.  No respiratory distress. Cardio: Regular rate and rhythm. GI: Abdomen is distended consistent with ascites.  No hepatomegaly. Vascular: No leg edema. Neuro: Alert and oriented. Port-A-Cath without erythema.  Lab Results:  Lab Results  Component Value Date   WBC 3.9 (L) 03/20/2018   HGB 9.1 (L) 03/20/2018   HCT 27.9 (L) 03/20/2018   MCV 88.6 03/20/2018   PLT 122 (L) 03/20/2018   NEUTROABS 2.1 03/20/2018    Imaging:  No results found.  Medications: I have reviewed the patient's current medications.  Assessment/Plan: 1. 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 Staging CTs 08/18/2016 with nonspecific pulmonary nodules, liver metastases, borderline thoracic and gastrohepatic ligament lymphadenopathy ? Cycle 1 FOLFOX 09/04/2016 ? Cycle 2 FOLFOX 09/18/2016 ? Cycle 3 FOLFOX 10/02/2016 ? Cycle 4 FOLFOX 10/16/2016 ? Cycle 5 FOLFOX 10/30/2016 ? Restaging CTs 11/10/2016-decreased wall thickness distal esophagus; stable borderline mediastinal lymphadenopathy; multiple liver metastases have completely resolved; stable scattered nonspecific tiny bilateral lung nodules ? Cycle 6 FOLFOX 11/13/2016 ? Cycle 7 FOLFOX  11/27/2016 ? Cycle 8 FOLFOX 12/11/2016 ? Cycle 9 FOLFOX 12/25/2016 ? Cycle 10 FOLFOX (oxaliplatin deleted) 01/08/2017 ? Initiation of maintenance therapy with 5-FU/leucovorin 02/06/2017 ? CTs 03/16/2017-stable pulmonary nodules, no esophagus mass, 1 subtle liver lesion ? Maintenance therapy continued with 5-FU/leucovorin every 3 weeks beginning 03/20/2017 ? CTs 06/29/2017-progressive liver metastases ? Cycle 1 Taxol/ramucirumab 07/12/2017, allergic reaction to Taxol ? Emergency room with right upper quadrant pain 07/14/2017 ? Cycle 1 day 8 07/18/2017-Abraxane substituted for Taxol ? Cycle 2-day 1 Abraxane/ramucirumab 08/09/2017 ? CEA improved 08/09/2017 ? CEA improved2/07/2017 ? Cycle 3-day 1 Abraxane/ramucirumab 09/06/2017;cycle 3-day 15 held due to neutropenia, poor performance status ? CTs 09/27/2017-slight decrease in right supraclavicular lymph nodes, mild increase in size of central right liver lesion, other liver lesions stable, stable small pulmonary nodules, marked narrowing of the SVC with thrombus around the Port-A-Cath ? Abraxane/ramucirumabcontinued on a 2-week schedule ? Restaging CTs 12/10/2017-stable appearance of the neck with multiple prominent neck lymph nodes without disease progression; similar to slight increase in thoracic adenopathy; progression of liver metastases; development of small volume abdominopelvic ascites; enlarging porta hepatis nodes.  Progression of hepatic metastases, progressive ascites, ? Cycle 1Lonsurf6/04/2018 ? Cycle 2Lonsurf7/02/2018 ? Cycle 3Lonsurf8/11/2017 ? CTs 03/18/2018-slight decrease in mediastinal/hilar lymphadenopathy, enlargement of 2 lung nodules, focal inflammation at the distal right common carotid artery 2. Diabetes  3. COPD  4. Hypertension  5. History of colon polyps  6. Port-A-Cath placement 08/30/2016  7. 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  8. Right  upper extremity deep vein thrombosis, Port-A-Cath related 08/24/2018started Xarelto 03/16/2017, possible subsegmental right lower lobe pulmonary embolus on CT 03/16/2017  CT 09/27/2017-narrowing of the SVC with thrombus around the Port-A-Cath, extensive chest  wall collaterals  9.Gross hematuria 05/24/2017-status post bladder irrigation in the emergency room, referred to urology  10.Depression. Trial of Remeron initiated 09/06/2017.  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  He is now followed by hospice.  He complains of progressive abdominal distention.  Exam is consistent with ascites.  We discussed a therapeutic paracentesis. He would like to hold on this for now.  He will contact the office if he changes his mind.  He will return for a Port-A-Cath flush and follow-up visit in 3 to 4 weeks.  He will contact the office in the interim as on above or with any other problems.  Patient seen with Dr. Benay Spice.    Ned Card ANP/GNP-BC   04/08/2018  3:00 PM  This was a shared visit with Ned Card.  Mr. Gervasi appears comfortable.  He has enrolled in home hospice care.  He will contact us if the ascites comes more symptomatic.  Julieanne Manson, MD

## 2018-04-24 ENCOUNTER — Telehealth: Payer: Self-pay | Admitting: *Deleted

## 2018-04-24 ENCOUNTER — Other Ambulatory Visit: Payer: Self-pay | Admitting: Nurse Practitioner

## 2018-04-24 ENCOUNTER — Other Ambulatory Visit: Payer: Self-pay | Admitting: Emergency Medicine

## 2018-04-24 ENCOUNTER — Telehealth: Payer: Self-pay | Admitting: Emergency Medicine

## 2018-04-24 DIAGNOSIS — C155 Malignant neoplasm of lower third of esophagus: Secondary | ICD-10-CM

## 2018-04-24 NOTE — Telephone Encounter (Signed)
"  Merrell Borsuk Isley's stepdaughter [name] [phone]) calling to request Dr.Sherrill order fluid to be drawn from his stomach.  A lot of fluid is building up, his stomach is swollen.  He says he feels pressure to his stomach.  Dr. Benay Spice said he just needs to call to have this scheduled.  No other symptoms at this time."  Dr. Gearldine Shown team notified.  Lattie Haw notified and expecting return call.  Per Lattie Haw, she is able to bring him today if radiology appointment availability.

## 2018-04-24 NOTE — Telephone Encounter (Signed)
Scheduled patient paracentesis for tomorrow @ 9am. Appointment time given to lisa, patients stepdaughter.

## 2018-04-25 ENCOUNTER — Ambulatory Visit (HOSPITAL_COMMUNITY)
Admission: RE | Admit: 2018-04-25 | Discharge: 2018-04-25 | Disposition: A | Discharge status: Home / Self Care | Source: Ambulatory Visit | Attending: Nurse Practitioner | Admitting: Nurse Practitioner

## 2018-04-25 ENCOUNTER — Encounter (HOSPITAL_COMMUNITY): Payer: Self-pay | Admitting: Radiology

## 2018-04-25 DIAGNOSIS — R188 Other ascites: Secondary | ICD-10-CM | POA: Diagnosis not present

## 2018-04-25 DIAGNOSIS — Z8501 Personal history of malignant neoplasm of esophagus: Secondary | ICD-10-CM | POA: Insufficient documentation

## 2018-04-25 DIAGNOSIS — C155 Malignant neoplasm of lower third of esophagus: Secondary | ICD-10-CM

## 2018-04-25 HISTORY — PX: IR PARACENTESIS: IMG2679

## 2018-04-25 MED ORDER — ONDANSETRON HCL 4 MG/2ML IJ SOLN
INTRAMUSCULAR | Status: AC
  Filled 2018-04-25: qty 2

## 2018-04-25 MED ORDER — ONDANSETRON HCL 4 MG/2ML IJ SOLN: 4.0000 mg | Freq: Once | INTRAMUSCULAR | Status: DC

## 2018-04-25 MED ORDER — LIDOCAINE HCL (PF) 1 % IJ SOLN
INTRAMUSCULAR | Status: AC | PRN
  Administered 2018-04-25: 10 mL

## 2018-04-25 MED ORDER — LIDOCAINE HCL 1 % IJ SOLN
INTRAMUSCULAR | Status: AC
  Filled 2018-04-25: qty 20

## 2018-04-25 NOTE — Procedures (Signed)
Ultrasound-guided diagnostic and therapeutic paracentesis performed yielding 5 liters (maximum ordered) of yellow  fluid. No immediate complications.  A portion of the fluid was sent to the lab for cytology.  

## 2018-05-08 ENCOUNTER — Other Ambulatory Visit: Payer: Self-pay | Admitting: *Deleted

## 2018-05-08 ENCOUNTER — Inpatient Hospital Stay: Attending: Oncology | Admitting: Oncology

## 2018-05-08 ENCOUNTER — Inpatient Hospital Stay

## 2018-05-08 VITALS — BP 118/74 | HR 103 | Temp 97.5°F | Resp 18 | Ht 69.0 in | Wt 172.1 lb

## 2018-05-08 DIAGNOSIS — C787 Secondary malignant neoplasm of liver and intrahepatic bile duct: Secondary | ICD-10-CM | POA: Diagnosis not present

## 2018-05-08 DIAGNOSIS — R319 Hematuria, unspecified: Secondary | ICD-10-CM | POA: Diagnosis not present

## 2018-05-08 DIAGNOSIS — R188 Other ascites: Secondary | ICD-10-CM | POA: Diagnosis not present

## 2018-05-08 DIAGNOSIS — R109 Unspecified abdominal pain: Secondary | ICD-10-CM | POA: Insufficient documentation

## 2018-05-08 DIAGNOSIS — Z86718 Personal history of other venous thrombosis and embolism: Secondary | ICD-10-CM | POA: Insufficient documentation

## 2018-05-08 DIAGNOSIS — C155 Malignant neoplasm of lower third of esophagus: Secondary | ICD-10-CM | POA: Diagnosis not present

## 2018-05-08 DIAGNOSIS — J449 Chronic obstructive pulmonary disease, unspecified: Secondary | ICD-10-CM | POA: Insufficient documentation

## 2018-05-08 DIAGNOSIS — R600 Localized edema: Secondary | ICD-10-CM | POA: Insufficient documentation

## 2018-05-08 DIAGNOSIS — E119 Type 2 diabetes mellitus without complications: Secondary | ICD-10-CM | POA: Diagnosis not present

## 2018-05-08 DIAGNOSIS — Z95828 Presence of other vascular implants and grafts: Secondary | ICD-10-CM

## 2018-05-08 DIAGNOSIS — G893 Neoplasm related pain (acute) (chronic): Secondary | ICD-10-CM | POA: Insufficient documentation

## 2018-05-08 DIAGNOSIS — Z86010 Personal history of colonic polyps: Secondary | ICD-10-CM | POA: Insufficient documentation

## 2018-05-08 DIAGNOSIS — I10 Essential (primary) hypertension: Secondary | ICD-10-CM | POA: Diagnosis not present

## 2018-05-08 DIAGNOSIS — Z79899 Other long term (current) drug therapy: Secondary | ICD-10-CM | POA: Insufficient documentation

## 2018-05-08 MED ORDER — HEPARIN SOD (PORK) LOCK FLUSH 100 UNIT/ML IV SOLN
500.0000 [IU] | Freq: Once | INTRAVENOUS | Status: DC | PRN
  Filled 2018-05-08: qty 5

## 2018-05-08 MED ORDER — SODIUM CHLORIDE 0.9% FLUSH
10.0000 mL | INTRAVENOUS | Status: DC | PRN
  Filled 2018-05-08: qty 10

## 2018-05-08 MED ORDER — OXYCODONE HCL 5 MG PO TABS: 5.0000 mg | ORAL_TABLET | ORAL | 0 refills | Status: AC | PRN

## 2018-05-08 NOTE — Progress Notes (Signed)
Pt did not want flush done. Stated "I don't fell like it, and I don't have that much time left and I appreciate everything y'all done for me." Pt was arrived to Dr. Hilaria Ota. Porsche Cates LPN

## 2018-05-08 NOTE — Progress Notes (Signed)
[place] OFFICE PROGRESS NOTE   Diagnosis: Esophagus cancer  INTERVAL HISTORY:   Justin [name] returns as scheduled.  He is to have abdominal pain.  The pain is partially relieved with hydrocodone.  He reports the hydrocodone only lasts for 1 hour.  He discontinued Xarelto anticoagulation several weeks ago.  He has noted swelling in the left leg for the past 2 days.  He has a poor appetite.  His family notes he is jaundiced. The abdomen is more distended, but he does not feel in need of a paracentesis at present. Objective:  Vital signs in last 24 hours:  Blood pressure 118/74, pulse (!) 103, temperature (!) 97.5 F (36.4 C), temperature source Oral, resp. rate 18, height _0  (1.753 m), weight 172 lb 1.6 oz (78.1 kg), SpO2 98 %.    HEENT: Scleral icterus Resp: Scattered inspiratory rhonchi, no respiratory distress Cardio: Regular rate and rhythm GI: The liver is palpable throughout the upper abdomen, the abdomen is distended with ascites Vascular: 1+ edema throughout the left lower leg, no erythema     Portacath/PICC-without erythema  Lab Results:  Lab Results  Component Value Date   WBC 3.9 (L) 03/20/2018   HGB 9.1 (L) 03/20/2018   HCT 27.9 (L) 03/20/2018   MCV 88.6 03/20/2018   PLT 122 (L) 03/20/2018   NEUTROABS 2.1 03/20/2018    CMP  Lab Results  Component Value Date   NA 138 03/20/2018   K 3.4 (L) 03/20/2018   CL 107 03/20/2018   CO2 24 03/20/2018   GLUCOSE 120 (H) 03/20/2018   BUN 9 03/20/2018   CREATININE 0.72 03/20/2018   CALCIUM 8.8 (L) 03/20/2018   PROT 6.6 03/20/2018   ALBUMIN 3.0 (L) 03/20/2018   AST 31 03/20/2018   ALT 25 03/20/2018   ALKPHOS 235 (H) 03/20/2018   BILITOT 0.6 03/20/2018   GFRNONAA >60 03/20/2018   GFRAA >60 03/20/2018    Lab Results  Component Value Date   CEA1 540.54 (H) 03/20/2018    Medications: I have reviewed the patient's current medications.   Assessment/Plan: 1. Adenocarcinoma of the distal  esophagus-EGD 08/18/2016 confirmed a distal esophagus mass, HER-2-negative;MSI stable;negative for PD-L1 expression ? Staging CTs 08/18/2016 with nonspecific pulmonary nodules, liver metastases, borderline thoracic and gastrohepatic ligament lymphadenopathy ? Cycle 1 FOLFOX 09/04/2016 ? Cycle 2 FOLFOX 09/18/2016 ? Cycle 3 FOLFOX 10/02/2016 ? Cycle 4 FOLFOX 10/16/2016 ? Cycle 5 FOLFOX 10/30/2016 ? Restaging CTs 11/10/2016-decreased wall thickness distal esophagus; stable borderline mediastinal lymphadenopathy; multiple liver metastases have completely resolved; stable scattered nonspecific tiny bilateral lung nodules ? Cycle 6 FOLFOX 11/13/2016 ? Cycle 7 FOLFOX 11/27/2016 ? Cycle 8 FOLFOX 12/11/2016 ? Cycle 9 FOLFOX 12/25/2016 ? Cycle 10 FOLFOX (oxaliplatin deleted) 01/08/2017 ? Initiation of maintenance therapy with 5-FU/leucovorin 02/06/2017 ? CTs 03/16/2017-stable pulmonary nodules, no esophagus mass, 1 subtle liver lesion ? Maintenance therapy continued with 5-FU/leucovorin every 3 weeks beginning 03/20/2017 ? CTs 06/29/2017-progressive liver metastases ? Cycle 1 Taxol/ramucirumab 07/12/2017, allergic reaction to Taxol ? Emergency room with right upper quadrant pain 07/14/2017 ? Cycle 1 day 8 07/18/2017-Abraxane substituted for Taxol ? Cycle 2-day 1 Abraxane/ramucirumab 08/09/2017 ? CEA improved 08/09/2017 ? CEA improved2/07/2017 ? Cycle 3-day 1 Abraxane/ramucirumab 09/06/2017;cycle 3-day 15 held due to neutropenia, poor performance status ? CTs 09/27/2017-slight decrease in right supraclavicular lymph nodes, mild increase in size of central right liver lesion, other liver lesions stable, stable small pulmonary nodules, marked narrowing of the SVC with thrombus around the Port-A-Cath ? Abraxane/ramucirumabcontinued on  a 2-week schedule ? Restaging CTs 12/10/2017-stable appearance of the neck with multiple prominent neck lymph nodes without disease progression; similar to slight increase  in thoracic adenopathy; progression of liver metastases; development of small volume abdominopelvic ascites; enlarging porta hepatis nodes.Progression of hepatic metastases, progressive ascites, ? Cycle 1Lonsurf6/04/2018 ? Cycle 2Lonsurf7/02/2018 ? Cycle 3Lonsurf8/11/2017 ? CTs 03/18/2018-slight decrease in mediastinal/hilar lymphadenopathy, enlargement of 2 lung nodules, focal inflammation at the distal right common carotid artery 2. Diabetes  3. COPD  4. Hypertension  5. History of colon polyps  6. Port-A-Cath placement 08/30/2016  7. 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  8. Right upper extremity deep vein thrombosis, Port-A-Cath related 08/24/2018started Xarelto 03/16/2017, possible subsegmental right lower lobe pulmonary embolus on CT 03/16/2017  CT 09/27/2017-narrowing of the SVC with thrombus around the Port-A-Cath, extensive chest wall collaterals  9.Gross hematuria 05/24/2017-status post bladder irrigation in the emergency room, referred to urology  10.Depression. Trial of Remeron initiated 09/06/2017.  11.  Left leg swelling 05/08/2018, probable DVT  12.  Ascites, status post a paracentesis 04/25/2018   Disposition: Justin [name] has metastatic esophagus cancer.  He is enrolled in the [facility] hospice program.  He continues to have a slow decline in his performance status.  He has pain secondary to liver metastases.  We changed the narcotic analgesic to oxycodone.  He will let us know if this does not help.  Justin [name] declines a Port-A-Cath flush.  He appears to have a left lower extremity DVT.  We discussed the risk/benefit of anticoagulation therapy.  He reports intermittent hematuria.  He understands he is at risk for a pulmonary embolism.  He does not wish to assume anticoagulation therapy.  He plans to continue clinical follow-up with the [facility] program.  He does not wish to schedule  a follow-up appointment here.  We are available to see him in the future as needed.  He will let us know if the ascites becomes uncomfortable and we will arrange for a repeat paracentesis.  Betsy Coder, MD  05/08/2018  11:34 AM

## 2018-05-14 ENCOUNTER — Emergency Department (HOSPITAL_COMMUNITY)
Admission: EM | Admit: 2018-05-14 | Discharge: 2018-05-14 | Disposition: A | Discharge status: Home / Self Care | Payer: Medicare Other | Attending: Emergency Medicine | Admitting: Emergency Medicine

## 2018-05-14 ENCOUNTER — Emergency Department (HOSPITAL_COMMUNITY): Payer: Medicare Other

## 2018-05-14 ENCOUNTER — Other Ambulatory Visit: Payer: Self-pay

## 2018-05-14 ENCOUNTER — Encounter (HOSPITAL_COMMUNITY): Payer: Self-pay | Admitting: Emergency Medicine

## 2018-05-14 DIAGNOSIS — Z7901 Long term (current) use of anticoagulants: Secondary | ICD-10-CM | POA: Diagnosis not present

## 2018-05-14 DIAGNOSIS — R188 Other ascites: Secondary | ICD-10-CM | POA: Insufficient documentation

## 2018-05-14 DIAGNOSIS — Z79899 Other long term (current) drug therapy: Secondary | ICD-10-CM | POA: Insufficient documentation

## 2018-05-14 DIAGNOSIS — S4991XA Unspecified injury of right shoulder and upper arm, initial encounter: Secondary | ICD-10-CM | POA: Diagnosis not present

## 2018-05-14 DIAGNOSIS — E119 Type 2 diabetes mellitus without complications: Secondary | ICD-10-CM | POA: Diagnosis not present

## 2018-05-14 DIAGNOSIS — R0781 Pleurodynia: Secondary | ICD-10-CM | POA: Insufficient documentation

## 2018-05-14 DIAGNOSIS — C787 Secondary malignant neoplasm of liver and intrahepatic bile duct: Secondary | ICD-10-CM | POA: Insufficient documentation

## 2018-05-14 DIAGNOSIS — R52 Pain, unspecified: Secondary | ICD-10-CM

## 2018-05-14 DIAGNOSIS — I10 Essential (primary) hypertension: Secondary | ICD-10-CM | POA: Diagnosis not present

## 2018-05-14 DIAGNOSIS — F17210 Nicotine dependence, cigarettes, uncomplicated: Secondary | ICD-10-CM | POA: Insufficient documentation

## 2018-05-14 DIAGNOSIS — S79911A Unspecified injury of right hip, initial encounter: Secondary | ICD-10-CM | POA: Diagnosis not present

## 2018-05-14 DIAGNOSIS — M25511 Pain in right shoulder: Secondary | ICD-10-CM | POA: Insufficient documentation

## 2018-05-14 DIAGNOSIS — R58 Hemorrhage, not elsewhere classified: Secondary | ICD-10-CM | POA: Diagnosis not present

## 2018-05-14 DIAGNOSIS — W19XXXA Unspecified fall, initial encounter: Secondary | ICD-10-CM

## 2018-05-14 DIAGNOSIS — W108XXA Fall (on) (from) other stairs and steps, initial encounter: Secondary | ICD-10-CM | POA: Diagnosis not present

## 2018-05-14 DIAGNOSIS — S299XXA Unspecified injury of thorax, initial encounter: Secondary | ICD-10-CM | POA: Diagnosis not present

## 2018-05-14 DIAGNOSIS — C799 Secondary malignant neoplasm of unspecified site: Secondary | ICD-10-CM

## 2018-05-14 MED ORDER — LIDOCAINE HCL 1 % IJ SOLN
INTRAMUSCULAR | Status: AC
  Filled 2018-05-14: qty 10

## 2018-05-14 MED ORDER — OXYCODONE HCL 5 MG PO TABS
10.0000 mg | ORAL_TABLET | Freq: Once | ORAL | Status: AC
  Administered 2018-05-14: 10 mg via ORAL
  Filled 2018-05-14: qty 2

## 2018-05-14 NOTE — Procedures (Signed)
Ultrasound-guided  therapeutic paracentesis performed yielding 5 liters (maximum ordered) of golden yellow fluid. No immediate complications.

## 2018-05-14 NOTE — Discharge Instructions (Addendum)
You were evaluated today for a fall.  Your xrays were negative for fracture or dislocation. You did have fluid pulled off of your stomach. Please follow up with your Oncologist for reevaluation.  Return to the ED with nay new or worsening symptoms.

## 2018-05-14 NOTE — ED Triage Notes (Addendum)
Pt fell this morning at 9am this morning while going up a step and pt states he fell onto R arm onto floor. Pt c/o R arm pain radiating to R chest. Denies head injury, denies LOC. Pt took an oxycodone this morning for pain.

## 2018-05-14 NOTE — ED Provider Notes (Signed)
[facility] DEPT Provider Note   CSN: [id number] Arrival date & time: 05/14/18  1201   History   Chief Complaint Chief Complaint  Patient presents with  . Fall  . Arm Pain  . Chest Pain    R    HPI Justin [name] is a 67 y.o. 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 Denies hitting head, or LOC. Patient states he fell walking up the stairs and landed on his right chest and right hip.  Has had pain to this area since fall.  Rates his pain a 5/10.  States he took his home pain medicine, Hydrocodone for this.  Denies decreased range of motion, numbness or tingling in extremities.  States he is currently a patient of [facility].  He has continued to have a decline in his performance status secondary to metastases of his cancer.  States he has had increased abdominal pain with abdominal swelling over the last 2 weeks.  Per patient, he is post paracentesis on 04/25/18 where he had 5 L removed.  Patient states his abdomen has been distended and doubled in size over the last 3 days.  States his abdomen is tight feeling.  States he has also had shortness of breath when he lays flat.  Denies fever, chills, chest pain, SOB, Nausea, vomiting, diarrhea.  History obtained from patient and family. No interpretor was used.   HPI  Past Medical History:  Diagnosis Date  . Allergy   . Cataract   . Diabetes mellitus   . 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 Headache   . History of kidney stones   . Hx of colonic polyps    pre-cancerous  . Hypercholesterolemia   . Hypertension     Patient Active Problem List   Diagnosis Date Noted  . Deep vein thrombosis (DVT) ([place]) 03/16/2017  . Neuropathy due to chemotherapeutic drug ([place]) 02/26/2017  . Port catheter in place 09/18/2016  . Cancer of distal  third of esophagus ([place]) 08/25/2016  . Goals of care, counseling/discussion 08/25/2016  . 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 History of renal stone 01/28/2016  . RLS (restless legs syndrome) 09/30/2015  . Arthritis 12/20/2012  . Current smoker 12/20/2012  . Type 2 diabetes with complication ([place]) 54/65/6812  . Hypertension associated with diabetes ([place]) 12/02/2010  . Hyperlipidemia associated with type 2 diabetes mellitus ([place]) 12/02/2010  . Allergic rhinitis 12/02/2010  . Diverticulosis of colon 12/02/2010  . BPH (benign prostatic hyperplasia) 12/02/2010  . Hx of colonic polyps 12/02/2010    Past Surgical History:  Procedure Laterality Date  . COLONOSCOPY  [id number]  . ESOPHAGOGASTRODUODENOSCOPY (EGD) WITH PROPOFOL N/A 08/18/2016   Procedure: ESOPHAGOGASTRODUODENOSCOPY (EGD) WITH PROPOFOL;  Surgeon: Carol Ada, MD;  Location: [facility];  Service: Endoscopy;  Laterality: N/A;  . IR GENERIC HISTORICAL  08/30/2016   IR US GUIDE VASC ACCESS RIGHT 08/30/2016 Corrie Mckusick, DO [facility]  . IR GENERIC HISTORICAL  08/30/2016   IR FLUORO GUIDE PORT INSERTION RIGHT 08/30/2016 Corrie Mckusick, DO [facility]  . IR PARACENTESIS  04/25/2018        Home Medications    Prior to Admission medications   Medication Sig Start Date End Date Taking? Authorizing Provider  finasteride (PROSCAR) 5 MG tablet Take 1 tablet (5 mg total) by mouth  daily. 11/14/17  Yes Denita Lung, MD  oxyCODONE (OXY IR/ROXICODONE) 5 MG immediate release tablet Take 1 tablet (5 mg total) by mouth every 4 (four) hours as needed for severe pain (1-2 tablets every 4 hours aneeded for pain). 05/08/18  Yes Ladell Pier, MD  predniSONE (DELTASONE) 20 MG tablet Take 1 mg by mouth daily with breakfast.    Yes [provider]  rivaroxaban (XARELTO) 20 MG TABS tablet Take 1 tablet (20 mg total) by mouth daily with supper. 03/20/18  Yes Ladell Pier, MD  blood  glucose meter kit and supplies KIT Dispense patient/insurance preference. Use up to four times daily as directed. Patient not taking: Reported on 05/08/2018 07/10/17   Denita Lung, MD  glucose blood test strip PT IS TO TEST ONE TO TWO TIMES A DAY 01/28/16   Denita Lung, MD  HYDROcodone-acetaminophen (NORCO/VICODIN) 5-325 MG tablet Take 1 tablet by mouth every 4 (four) hours as needed. Patient not taking: Reported on 05/14/2018 03/20/18   Ladell Pier, MD  Lancets (FREESTYLE) lancets PT IS TO TEST ONE TO TWO TIMES A DAY 01/28/16   Denita Lung, MD  lidocaine-prilocaine (EMLA) cream Apply 1 application topically as needed. Apply to port site and cover with plastic wrap one hour before port access 09/20/17   Owens Shark, NP  lisinopril-hydrochlorothiazide (PRINZIDE,ZESTORETIC) 10-12.5 MG tablet Take 1 tablet by mouth daily. Patient not taking: Reported on 05/08/2018 07/10/17   Denita Lung, MD  mirtazapine (REMERON) 15 MG tablet Take 1 tablet (15 mg total) by mouth at bedtime. Patient not taking: Reported on 05/08/2018 09/06/17   Owens Shark, NP  simvastatin (ZOCOR) 10 MG tablet Take 1 tablet (10 mg total) by mouth at bedtime. Patient not taking: Reported on 05/08/2018 01/28/16   Denita Lung, MD    Family History Family History  Problem Relation Age of Onset  . Stomach cancer Father   . Breast cancer Sister   . Liver cancer Brother     Social History Social History   Tobacco Use  . Smoking status: Current Every Day Smoker    Packs/day: 1.00    Years: 50.00    Pack years: 50.00    Types: Cigarettes  . Smokeless tobacco: Never Used  Substance Use Topics  . Alcohol use: No  . Drug use: No     Allergies   Simvastatin and Taxol [paclitaxel]   Review of Systems Review of Systems  Constitutional: Positive for activity change, appetite change, fatigue and unexpected weight change. Negative for fever.  Respiratory: Positive for shortness of breath. Negative for  cough, choking, chest tightness, wheezing and stridor.   Cardiovascular: Negative.   Gastrointestinal: Positive for abdominal distention, abdominal pain and nausea. Negative for anal bleeding, blood in stool, constipation, diarrhea, rectal pain and vomiting.  Genitourinary: Negative.   Musculoskeletal: Positive for arthralgias, back pain, gait problem, joint swelling and myalgias. Negative for neck pain and neck stiffness.  Skin: Positive for color change. Negative for wound.  All other systems reviewed and are negative.    Physical Exam Updated Vital Signs BP 113/72 (BP Location: Left Arm)   Pulse 89   Resp 16   SpO2 95%   Physical Exam  Constitutional: No distress.  Patient appears chronically ill.  HENT:  Head: Atraumatic.  Eyes: Pupils are equal, round, and reactive to light.  Scleral icterus to bilateral eyes, mildly dilated bilateral pupils equal and reactive.  Neck: Normal range of motion.  Neck supple.  Cardiovascular: Normal rate, regular rhythm and intact distal pulses.  Pulmonary/Chest: Effort normal. No respiratory distress. He has no wheezes. He has no rhonchi.  Abdominal: Soft. He exhibits distension, fluid wave and ascites. He exhibits no pulsatile liver and no pulsatile midline mass. There is no tenderness. There is no rigidity, no rebound, no guarding and no CVA tenderness.  Diastasis recti  Musculoskeletal: Normal range of motion.       Right lower leg: He exhibits no tenderness and no edema.       Left lower leg: He exhibits tenderness and edema.  Left Lower leg 2+ pitting edema.  Patient with known DVT in left lower extremity. Has been seen by oncology prescribe Xarelto for this last week. No color change to bilateral lower extremity. 2+ pedal pulses bilaterally. Able to move extremities without difficulty. Able to ambulate without pain or ataxia.  Neurological: He is alert.  Skin: Skin is warm and dry. He is not diaphoretic.  Jaundice.  Psychiatric: He has a  normal mood and affect.  Nursing note and vitals reviewed.    ED Treatments / Results  Labs (all labs ordered are listed, but only abnormal results are displayed) Labs Reviewed - No data to display  EKG None  Radiology Dg Chest 2 View  Result Date: 05/14/2018 CLINICAL DATA:  Shortness of breath and productive cough. Fell this morning. RIGHT pain. History of esophageal and pancreatic cancer. EXAM: CHEST - 2 VIEW COMPARISON:  Chest CT March 18, 2018. FINDINGS: The heart size and mediastinal contours are within normal limits. Both lungs are clear. RIGHT chest Port-A-Cath in situ. The visualized skeletal structures are unremarkable. IMPRESSION: No acute cardiopulmonary process. Electronically Signed   By: Elon Alas M.D.   On: 05/14/2018 14:06   Dg Ribs Unilateral Right  Result Date: 05/14/2018 CLINICAL DATA:  [place] today, right upper rib pain EXAM: RIGHT RIBS - 2 VIEW COMPARISON:  Chest x-ray of 05/14/2017 FINDINGS: Right rib detail films with the area of pain marked, show no acute right rib fracture. No pneumothorax is evident and no effusion is seen. Right-sided Port-A-Cath tip overlies the lower SVC. IMPRESSION: Negative right rib detail. Electronically Signed   By: Ivar Drape M.D.   On: 05/14/2018 16:27   Dg Shoulder Right  Result Date: 05/14/2018 CLINICAL DATA:  [place] today with right shoulder pain and upper rib pain EXAM: RIGHT SHOULDER - 2+ VIEW COMPARISON:  Chest x-ray of 05/14/2017 and CT chest of 03/18/2017 FINDINGS: The right humeral head is in normal position and the glenohumeral joint space appears normal. Right AC joint is normally aligned with only mild degenerative change. No acute abnormality is seen. IMPRESSION: No acute abnormality.  Mild degenerative change. Electronically Signed   By: Ivar Drape M.D.   On: 05/14/2018 16:25   US Paracentesis  Result Date: 05/14/2018 INDICATION: Patient with history of metastatic esophageal carcinoma, recurrent ascites. Request  made for therapeutic paracentesis up to 5 liters. EXAM: ULTRASOUND GUIDED THERAPEUTIC PARACENTESIS MEDICATIONS: None COMPLICATIONS: None immediate. PROCEDURE: Informed written consent was obtained from the patient after a discussion of the risks, benefits and alternatives to treatment. A timeout was performed prior to the initiation of the procedure. Initial ultrasound scanning demonstrates a large amount of ascites within the right lower abdominal quadrant. The right lower abdomen was prepped and draped in the usual sterile fashion. 1% lidocaine was used for local anesthesia. Following this, a 19 gauge, 7-cm, Yueh catheter was introduced. An ultrasound image was saved for  documentation purposes. The paracentesis was performed. The catheter was removed and a dressing was applied. The patient tolerated the procedure well without immediate post procedural complication. FINDINGS: A total of approximately 5 liters of golden yellow fluid was removed. IMPRESSION: Successful ultrasound-guided therapeutic paracentesis yielding 5 liters of peritoneal fluid. Read by: Rowe Robert, PA-C Electronically Signed   By: Jerilynn Mages.  Shick M.D.   On: 05/14/2018 15:52   Dg Hip Unilat  With Pelvis 2-3 Views Right  Result Date: 05/14/2018 CLINICAL DATA:  Shortness of breath and productive cough. Fell this morning. RIGHT pain. History of esophageal and pancreatic cancer. EXAM: DG HIP (WITH OR WITHOUT PELVIS) 2-3V RIGHT COMPARISON:  None. FINDINGS: There is no evidence of hip fracture or dislocation. There is no evidence of arthropathy or other focal bone abnormality. Faint vascular calcifications. IMPRESSION: Negative. Electronically Signed   By: Elon Alas M.D.   On: 05/14/2018 14:06    Procedures Procedures (including critical care time)  Medications Ordered in ED Medications  lidocaine (XYLOCAINE) 1 % (with pres) injection (has no administration in time range)  oxyCODONE (Oxy IR/ROXICODONE) immediate release tablet 10 mg  (10 mg Oral Given 05/14/18 1635)     Initial Impression / Assessment and Plan / ED Course  I have reviewed the triage vital signs and the nursing notes.  Pertinent labs & imaging results that were available during my care of the patient were reviewed by me and considered in my medical decision making (see chart for details).  67 year old male who appears chronically ill presents for evaluation after mechanical fall.  Patient with right-sided rib pain, right shoulder pain and right hip pain.  Patient on hospice care for pancreatic cancer with metastasis to liver and esophagus.  Patient is currently with [facility] hosp[facility] care.  Thorough discussion with patient and daughter ultimate goal of care and treatment/plan.  Patient and daughter states they would like imaging, however do not know if they would like surgical management if abnormal finding.  Patient states that they will reevaluate at the time if any of the imaging is positive that would require surgical intervention.    Patient is jaundice with ascites.  According to past medical records with oncology patient did have a therapeutic paracentesis on 04/25/2018.  Approximately 5 L removed at that time. Patient and daughter endorse increased abdominal distention, tightness and pain over the last 3 days.  States this has worsened since he was seen by oncology on the 16th of this month.  Patient has had some shortness of breath when laying down at night since his worsening ascites.  Due to his symptoms will try to have interventional radiology do a therapeutic paracentesis to help relieve his symptoms.  1515: IR to take patient for therapeutic US guided parasentesis.   Plain film negative for fracture or dislocation, does not want anything additional for pain at this time.  1553: IR, Darrell Allred PA-C performed US guided paracentesis with 5 L golden yellow liquid removed. See note.  1645: On re-evaluation patient with improved abdominal  distention. Low suspicion for SBP given afebrile and yellow golden color of acites fluid.   Discussed with patient and step-daughter results of imaging. Patient stable for dc home at this time. Discussed follow-up with patient Oncologist if continued abdominal distension. Discussed strict return precautions. Patient and family voice understanding and are agreeable for follow-up.  Patient was discussed and seen by attending, Dr. Wilson Singer who agrees with above treatment, plan and disposition.   Final Clinical Impressions(s) / ED  Diagnoses   Final diagnoses:  Ascites  Fall, initial encounter  Acute pain of right shoulder  Rib pain  Metastatic cancer [facility])    ED Discharge Orders    None       Marcellas Marchant A, PA-C 05/14/18 1708    Virgel Manifold, MD 05/16/18 [phone]

## 2018-05-22 ENCOUNTER — Telehealth: Payer: Self-pay | Admitting: *Deleted

## 2018-05-24 NOTE — Telephone Encounter (Signed)
Received phone call from Hospice. Patient passed away today. MD notified and future appointments cancelled.

## 2018-05-24 DEATH — deceased

## 2018-05-30 ENCOUNTER — Telehealth: Payer: Self-pay | Admitting: Family Medicine

## 2018-05-30 NOTE — Telephone Encounter (Signed)
Sympathy card sent to wife

## 2018-10-17 IMAGING — CT CT ABD-PELV W/ CM
2 of 5 series · 14 of 46 positions shown, 16 images · IV contrast (ISOVUE 300)
Comparison: 06/29/2017

CLINICAL DATA: Restaging metastatic esophageal cancer.

EXAM:
CT CHEST, ABDOMEN, AND PELVIS WITH CONTRAST
TECHNIQUE: Multidetector CT imaging of the chest, abdomen and pelvis was
performed following the standard protocol during bolus
administration of intravenous contrast.
CONTRAST:  100mL 30MJ39-011 IOPAMIDOL (30MJ39-011) INJECTION 61%

[Series 2: cap with · axial · 0.88mm/px · z∈[-789,-259]mm · 11 of 128 slices shown, 13 images]
[im 11/128  soft-tissue]
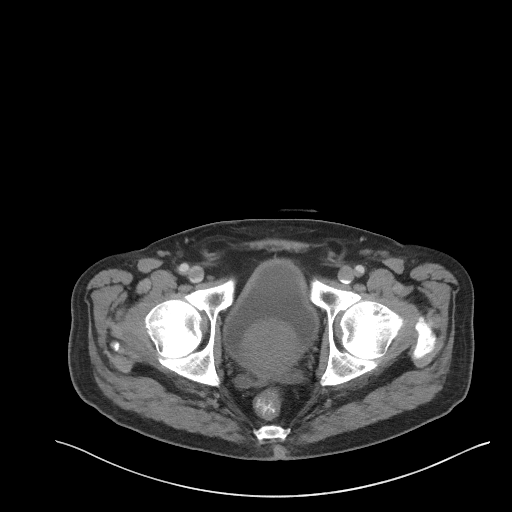
[im 11/128  bone]
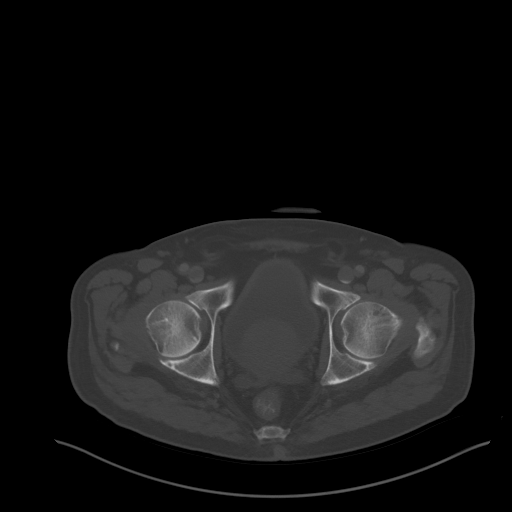
[im 22/128  soft-tissue]
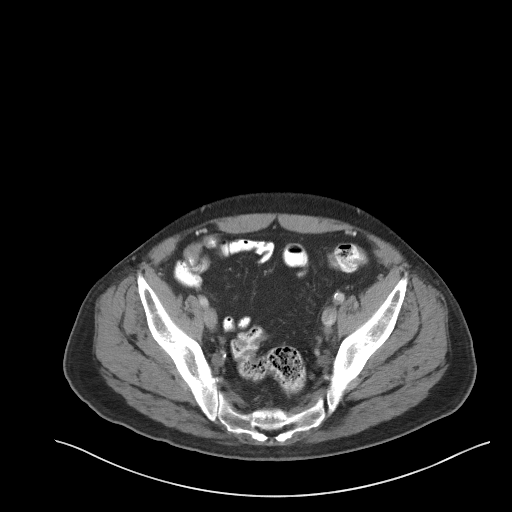
[im 32/128  soft-tissue]
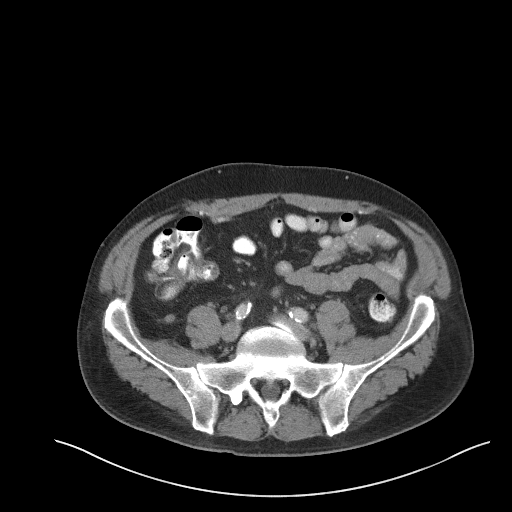
[im 43/128  soft-tissue]
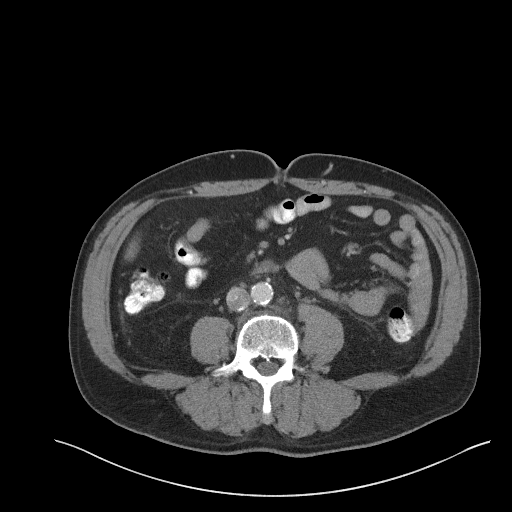
[im 53/128  soft-tissue]
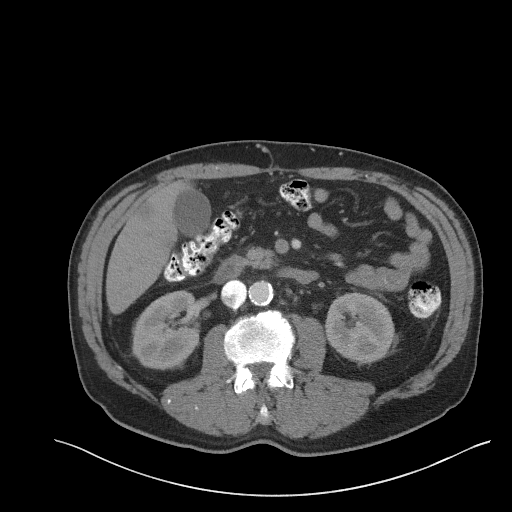
[im 64/128  soft-tissue]
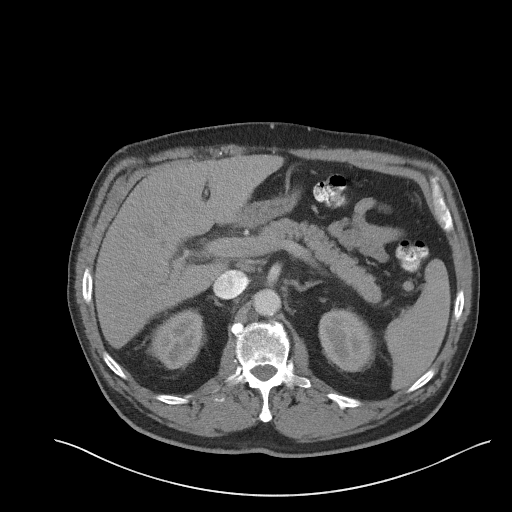
[im 75/128  soft-tissue]
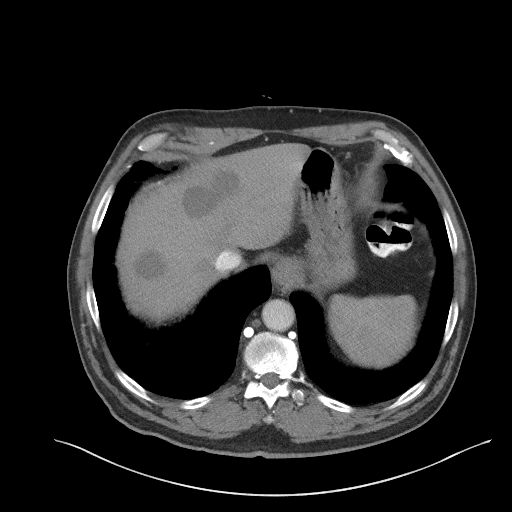
[im 85/128  soft-tissue]
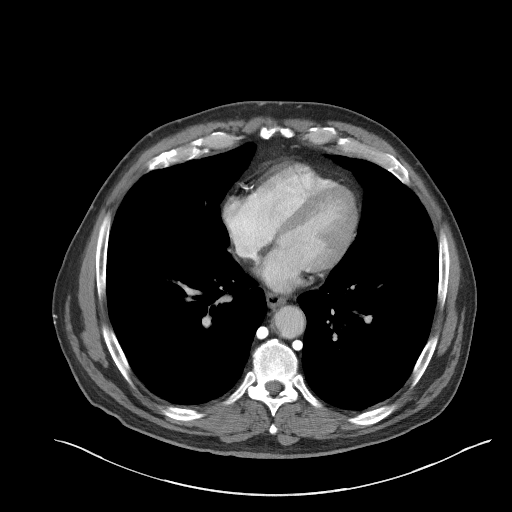
[im 96/128  soft-tissue]
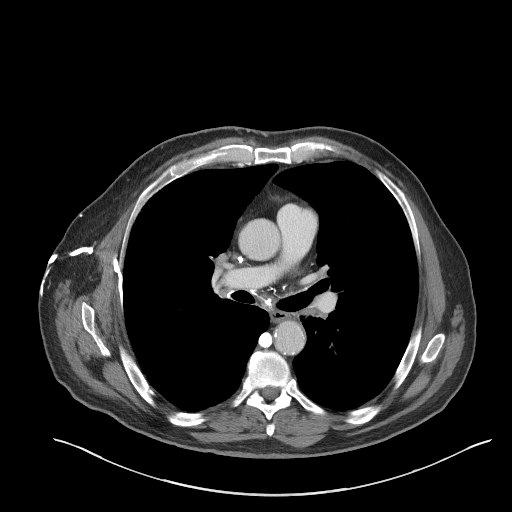
[im 96/128  bone]
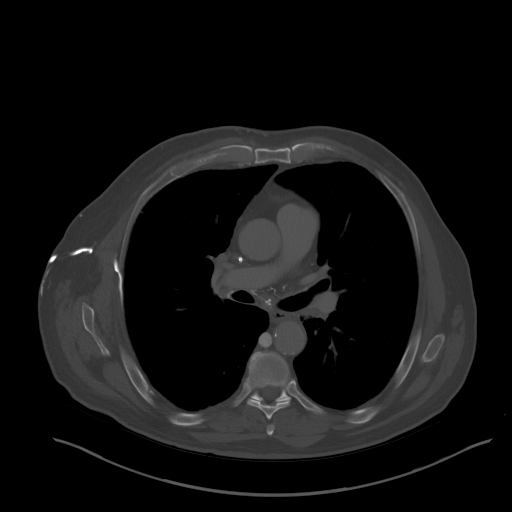
[im 106/128  soft-tissue]
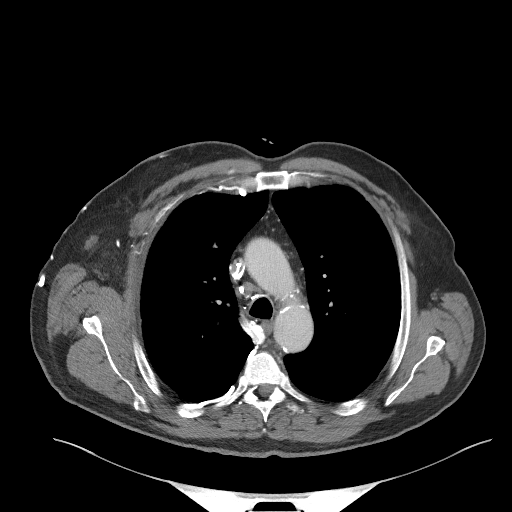
[im 117/128  soft-tissue]
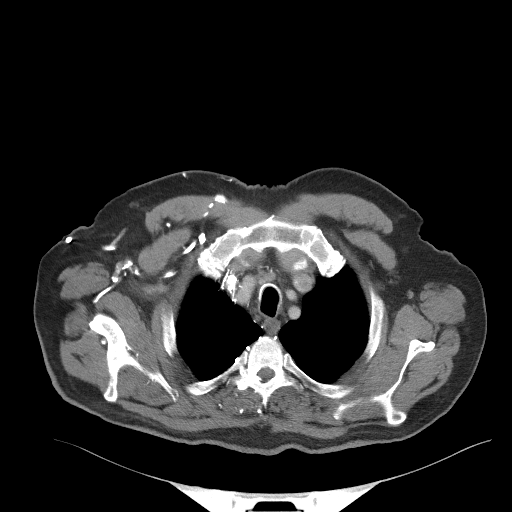

[Series 4: coronals · coronal · 0.91mm/px · 3 of 148 slices shown]
[im 50/148  soft-tissue]
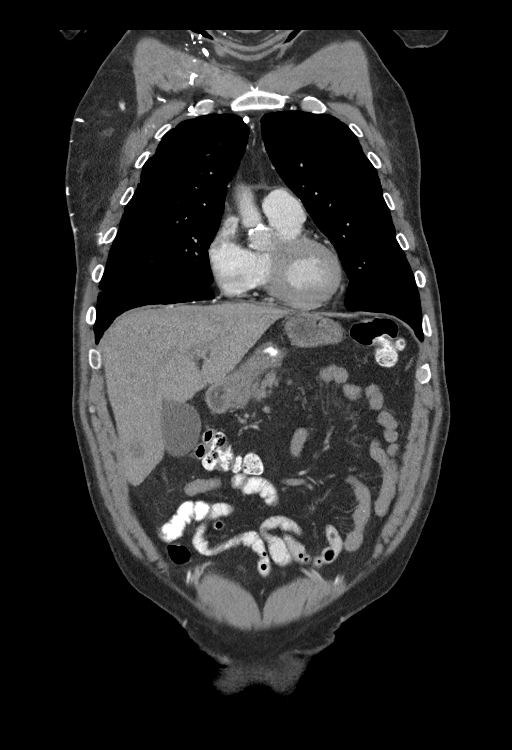
[im 66/148  soft-tissue]
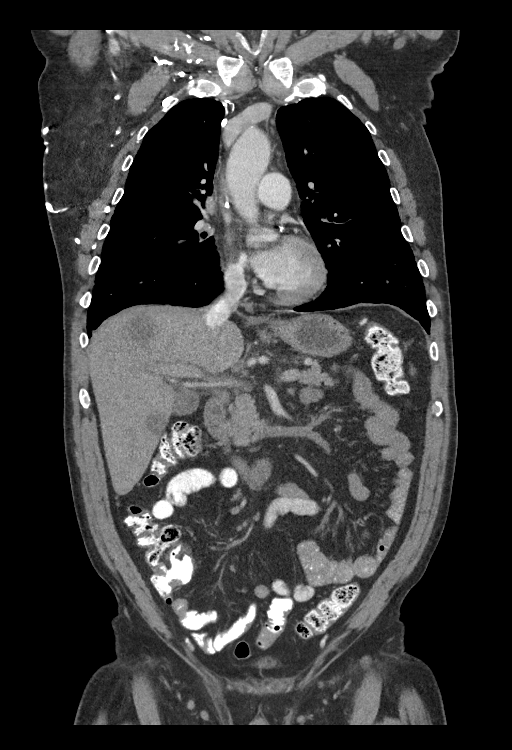
[im 82/148  soft-tissue]
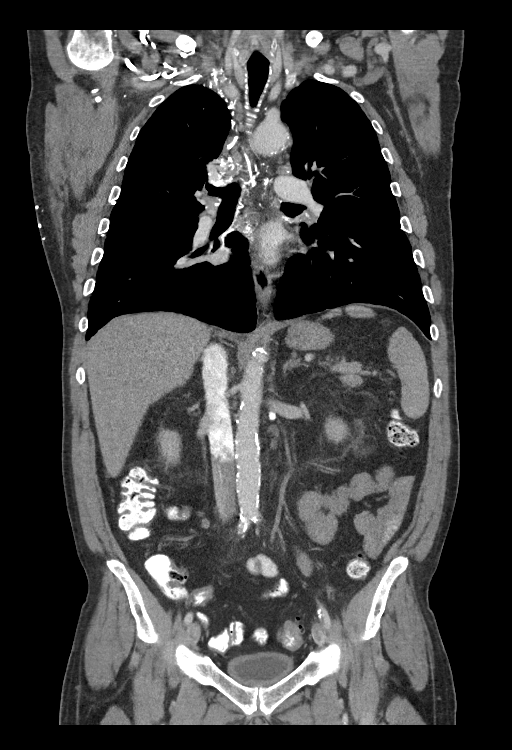

[14 of 46 positions shown; findings below may reference images not displayed]

FINDINGS: CT CHEST FINDINGS

Cardiovascular: The heart size appears within normal limits. Aortic
atherosclerosis. There is a right chest wall port a catheter with
tip just above the cavoatrial junction. The superior vena cava is
markedly diminished in caliber. Thrombus surrounding the catheter is
identified. There is extensive right chest wall collaterals with
retrograde filling of the inferior vena cava into the right atrium.
There is aortic atherosclerosis. Calcifications in the left main
coronary artery, LAD and RCA noted.

Mediastinum/Nodes: Normal appearance of the thyroid gland. The
trachea appears patent and is midline. Normal appearance of the
esophagus. Index right paratracheal lymph node is unchanged
measuring 11 mm, image 26/series 2. Left hilar lymph node is stable
measuring 1.1 cm, image 38/2.

Lungs/Pleura: There is no pleural effusion identified. Moderate
changes of emphysema. Pulmonary nodule within the superior segment
of right lower lobe appears less solid on today's study, stable at 6
mm, image 68/series 6. Right upper lobe solid nodule is unchanged
measuring 4 mm, image 47/6. Posterior left upper lobe lung nodule is
unchanged measuring 4 mm, image 43/6. No new or progressive
nodularity identified.

Musculoskeletal: No aggressive lytic or sclerotic bone lesions.

CT ABDOMEN PELVIS FINDINGS

Hepatobiliary: Multifocal liver metastases are again identified.
Index lesion within posterior right lobe of liver measures 2.5 cm,
image 62/series 2. Unchanged. Dome of liver lesion measures 2.9 cm,
image 53/series 2. Previously 2.8 cm. Central right lobe of liver
lesion measures 3.0 cm, image 55/series 2. Previously 2.4 cm. Lesion
within segment 5 measures 2.9 cm, image 73/series 2. Previously
cm.

Pancreas: Unremarkable. No pancreatic ductal dilatation or
surrounding inflammatory changes.

Spleen: Normal in size without focal abnormality.

Adrenals/Urinary Tract: The adrenal glands are normal. Unremarkable
appearance of both kidneys. Urinary bladder is normal.

Stomach/Bowel: Stomach is within normal limits. Appendix appears
normal. No evidence of bowel wall thickening, distention, or
inflammatory changes. Her

Vascular/Lymphatic: Aortic atherosclerosis. No aneurysm. Index
portacaval lymph node measures 1.3 cm, image 68/2. Previously
cm. No pelvic or inguinal adenopathy.

Reproductive: Prostate gland enlargement is again noted.

Other: No ascites or focal fluid collections identified.

Musculoskeletal: No acute or significant osseous findings.
IMPRESSION: 1. Multifocal liver metastasis again noted. The central right lobe
of liver lesion is mildly increased in size. The other index lesions
appears stable.
2. Stable small nonspecific pulmonary nodules.
3. Aortic Atherosclerosis (315GB-HKP.P) and Emphysema (315GB-I5P.6).
4. Interval marked narrowing of the superior vena cava with thrombus
around the porta catheter. There are extensive chest wall
collaterals with contrast filling the right heart the of the IVC.
5. Atherosclerotic calcifications within the RCA, LAD and left
circumflex coronary arteries.
